# Patient Record
Sex: Male | Born: 1935 | Race: White | Hispanic: No | Marital: Married | Smoking: Former smoker
Health system: Southern US, Community
[De-identification: ages and names within clinical notes are randomized; demographics above are authoritative.]

## PROBLEM LIST (undated history)

## (undated) DIAGNOSIS — T4145XA Adverse effect of unspecified anesthetic, initial encounter: Secondary | ICD-10-CM

## (undated) DIAGNOSIS — R0600 Dyspnea, unspecified: Secondary | ICD-10-CM

## (undated) DIAGNOSIS — I272 Pulmonary hypertension, unspecified: Secondary | ICD-10-CM

## (undated) DIAGNOSIS — I10 Essential (primary) hypertension: Secondary | ICD-10-CM

## (undated) DIAGNOSIS — I509 Heart failure, unspecified: Secondary | ICD-10-CM

## (undated) DIAGNOSIS — M1990 Unspecified osteoarthritis, unspecified site: Secondary | ICD-10-CM

## (undated) DIAGNOSIS — N059 Unspecified nephritic syndrome with unspecified morphologic changes: Secondary | ICD-10-CM

## (undated) DIAGNOSIS — R112 Nausea with vomiting, unspecified: Secondary | ICD-10-CM

## (undated) DIAGNOSIS — I8290 Acute embolism and thrombosis of unspecified vein: Secondary | ICD-10-CM

## (undated) DIAGNOSIS — D649 Anemia, unspecified: Secondary | ICD-10-CM

## (undated) DIAGNOSIS — N2889 Other specified disorders of kidney and ureter: Secondary | ICD-10-CM

## (undated) DIAGNOSIS — E785 Hyperlipidemia, unspecified: Secondary | ICD-10-CM

## (undated) DIAGNOSIS — Z8781 Personal history of (healed) traumatic fracture: Secondary | ICD-10-CM

## (undated) DIAGNOSIS — Z9889 Other specified postprocedural states: Secondary | ICD-10-CM

## (undated) DIAGNOSIS — N400 Enlarged prostate without lower urinary tract symptoms: Secondary | ICD-10-CM

## (undated) DIAGNOSIS — T8859XA Other complications of anesthesia, initial encounter: Secondary | ICD-10-CM

## (undated) DIAGNOSIS — K219 Gastro-esophageal reflux disease without esophagitis: Secondary | ICD-10-CM

## (undated) DIAGNOSIS — J449 Chronic obstructive pulmonary disease, unspecified: Secondary | ICD-10-CM

## (undated) DIAGNOSIS — J189 Pneumonia, unspecified organism: Secondary | ICD-10-CM

## (undated) DIAGNOSIS — G4730 Sleep apnea, unspecified: Secondary | ICD-10-CM

## (undated) DIAGNOSIS — Z972 Presence of dental prosthetic device (complete) (partial): Secondary | ICD-10-CM

## (undated) DIAGNOSIS — Z9289 Personal history of other medical treatment: Secondary | ICD-10-CM

## (undated) DIAGNOSIS — C679 Malignant neoplasm of bladder, unspecified: Secondary | ICD-10-CM

## (undated) DIAGNOSIS — K08109 Complete loss of teeth, unspecified cause, unspecified class: Secondary | ICD-10-CM

## (undated) HISTORY — PX: TONSILLECTOMY AND ADENOIDECTOMY: SUR1326

---

## 1968-11-28 HISTORY — PX: KNEE LIGAMENT RECONSTRUCTION: SHX1895

## 1983-11-29 HISTORY — PX: APPENDECTOMY: SHX54

## 2004-11-28 HISTORY — PX: TRANSURETHRAL RESECTION OF PROSTATE: SHX73

## 2005-11-28 DIAGNOSIS — I8290 Acute embolism and thrombosis of unspecified vein: Secondary | ICD-10-CM

## 2005-11-28 DIAGNOSIS — N059 Unspecified nephritic syndrome with unspecified morphologic changes: Secondary | ICD-10-CM

## 2005-11-28 HISTORY — DX: Acute embolism and thrombosis of unspecified vein: I82.90

## 2005-11-28 HISTORY — DX: Unspecified nephritic syndrome with unspecified morphologic changes: N05.9

## 2006-04-17 ENCOUNTER — Ambulatory Visit (HOSPITAL_COMMUNITY): Admission: RE | Admit: 2006-04-17 | Discharge: 2006-04-18 | Payer: Self-pay | Admitting: Nephrology

## 2006-04-17 ENCOUNTER — Encounter (INDEPENDENT_AMBULATORY_CARE_PROVIDER_SITE_OTHER): Payer: Self-pay | Admitting: Specialist

## 2006-04-27 ENCOUNTER — Emergency Department (HOSPITAL_COMMUNITY): Admission: EM | Admit: 2006-04-27 | Discharge: 2006-04-27 | Payer: Self-pay | Admitting: Emergency Medicine

## 2008-02-01 ENCOUNTER — Encounter: Admission: RE | Admit: 2008-02-01 | Discharge: 2008-02-01 | Payer: Self-pay | Admitting: Orthopedic Surgery

## 2008-02-05 ENCOUNTER — Ambulatory Visit (HOSPITAL_BASED_OUTPATIENT_CLINIC_OR_DEPARTMENT_OTHER): Admission: RE | Admit: 2008-02-05 | Discharge: 2008-02-05 | Payer: Self-pay | Admitting: Orthopedic Surgery

## 2008-02-05 HISTORY — PX: CARPAL TUNNEL RELEASE: SHX101

## 2009-11-28 HISTORY — PX: OTHER SURGICAL HISTORY: SHX169

## 2011-04-12 NOTE — Op Note (Signed)
Victor Wang, Victor Wang            ACCOUNT NO.:  [id number]   MEDICAL RECORD NO.:  [id number]          PATIENT TYPE:  AMB   LOCATION:  DSC                          FACILITY:  [facility]   PHYSICIAN:  Cindee Salt, M.D.       DATE OF BIRTH:  Dec 01, 1934   DATE OF PROCEDURE:  02/05/2008  DATE OF DISCHARGE:                               OPERATIVE REPORT   PREOPERATIVE DIAGNOSIS:  Carpal tunnel syndrome, right hand.   POSTOPERATIVE DIAGNOSIS:  Carpal tunnel syndrome, right hand.   OPERATION:  Decompression right median nerve.   SURGEON:  Cindee Salt, M.D.   ASSISTANT:  Carolyne Fiscal R.N.   ANESTHESIA:  Forearm based IV regional.   DATE OF OPERATION:  February 05, 2008   ANESTHESIOLOGIST:  Crus.   HISTORY:  The patient is a 76 year old male with a history of carpal  tunnel syndrome, EMG nerve conductions positive.  This has not responded  to conservative treatment.  He is desirous of having this surgically  released.  He is aware of risks and complications including infection,  recurrence, injury to arteries, nerves, tendons, incomplete relief of  symptoms, dystrophy.  In the preoperative area the patient is seen.  The  extremity marked by both the patient and surgeon.  Antibiotic given.   PROCEDURE:  The patient is brought to the operating room where a forearm  based IV regional anesthetic was carried out without difficulty.  Was  prepped using DuraPrep, supine position, right arm free.  After a time-  out, a longitudinal incision was made, carried down through subcutaneous  tissue.  Bleeders were electrocauterized.  Palmar fascia split,  superficial palmar arch identified and the flexor tendon to the ring and  little finger identified to the ulnar side of the median nerve.  The  carpal retinaculum was incised with sharp dissection, right angle and  Sewall retractor were placed through skin and forearm fascia.  Fascia  was released for approximately 1.5 cm proximal to the wrist crease under  direct vision.  The canal was explored.  A definite area of compression  with hourglass deformity, hyperemia to the nerve was immediately  apparent.  No further lesions were identified.  The wound was  irrigated.  The skin closed with interrupted 5-0 Vicryl Rapide sutures.  Sterile compressive dressing and wrist splint applied.  The patient  tolerated the procedure well was taken to the recovery for observation  in satisfactory condition.  He will be discharged home to return to the  [facility] in 1 week on Vicodin.           ______________________________  Cindee Salt, M.D.     GK/MEDQ  D:  02/05/2008  T:  02/06/2008  Job:  [id number]   cc:   Hilaria Ota, M.D.

## 2011-08-22 LAB — POCT HEMOGLOBIN-HEMACUE: Hemoglobin: 14.7

## 2011-08-22 LAB — BASIC METABOLIC PANEL
Chloride: 103
GFR calc non Af Amer: >60
Sodium: 137

## 2011-11-30 DIAGNOSIS — M25519 Pain in unspecified shoulder: Secondary | ICD-10-CM | POA: Diagnosis not present

## 2011-12-07 DIAGNOSIS — M25519 Pain in unspecified shoulder: Secondary | ICD-10-CM | POA: Diagnosis not present

## 2011-12-14 DIAGNOSIS — D649 Anemia, unspecified: Secondary | ICD-10-CM | POA: Diagnosis not present

## 2011-12-14 DIAGNOSIS — I10 Essential (primary) hypertension: Secondary | ICD-10-CM | POA: Diagnosis not present

## 2011-12-14 DIAGNOSIS — D759 Disease of blood and blood-forming organs, unspecified: Secondary | ICD-10-CM | POA: Diagnosis not present

## 2011-12-19 DIAGNOSIS — M25519 Pain in unspecified shoulder: Secondary | ICD-10-CM | POA: Diagnosis not present

## 2011-12-22 DIAGNOSIS — M25519 Pain in unspecified shoulder: Secondary | ICD-10-CM | POA: Diagnosis not present

## 2011-12-29 DIAGNOSIS — M25519 Pain in unspecified shoulder: Secondary | ICD-10-CM | POA: Diagnosis not present

## 2012-01-05 DIAGNOSIS — M25519 Pain in unspecified shoulder: Secondary | ICD-10-CM | POA: Diagnosis not present

## 2012-01-16 DIAGNOSIS — K219 Gastro-esophageal reflux disease without esophagitis: Secondary | ICD-10-CM | POA: Diagnosis not present

## 2012-01-16 DIAGNOSIS — M25519 Pain in unspecified shoulder: Secondary | ICD-10-CM | POA: Diagnosis not present

## 2012-01-19 DIAGNOSIS — M25519 Pain in unspecified shoulder: Secondary | ICD-10-CM | POA: Diagnosis not present

## 2012-01-30 DIAGNOSIS — M25519 Pain in unspecified shoulder: Secondary | ICD-10-CM | POA: Diagnosis not present

## 2012-03-19 DIAGNOSIS — I10 Essential (primary) hypertension: Secondary | ICD-10-CM | POA: Diagnosis not present

## 2012-03-19 DIAGNOSIS — Z1212 Encounter for screening for malignant neoplasm of rectum: Secondary | ICD-10-CM | POA: Diagnosis not present

## 2012-03-19 DIAGNOSIS — R3911 Hesitancy of micturition: Secondary | ICD-10-CM | POA: Diagnosis not present

## 2012-03-19 DIAGNOSIS — N049 Nephrotic syndrome with unspecified morphologic changes: Secondary | ICD-10-CM | POA: Diagnosis not present

## 2012-03-19 DIAGNOSIS — J984 Other disorders of lung: Secondary | ICD-10-CM | POA: Diagnosis not present

## 2012-03-19 DIAGNOSIS — E785 Hyperlipidemia, unspecified: Secondary | ICD-10-CM | POA: Diagnosis not present

## 2012-03-19 DIAGNOSIS — D649 Anemia, unspecified: Secondary | ICD-10-CM | POA: Diagnosis not present

## 2012-03-21 DIAGNOSIS — J984 Other disorders of lung: Secondary | ICD-10-CM | POA: Diagnosis not present

## 2012-05-02 DIAGNOSIS — H2510 Age-related nuclear cataract, unspecified eye: Secondary | ICD-10-CM | POA: Diagnosis not present

## 2012-06-18 DIAGNOSIS — I10 Essential (primary) hypertension: Secondary | ICD-10-CM | POA: Diagnosis not present

## 2012-06-18 DIAGNOSIS — D509 Iron deficiency anemia, unspecified: Secondary | ICD-10-CM | POA: Diagnosis not present

## 2012-07-09 DIAGNOSIS — N058 Unspecified nephritic syndrome with other morphologic changes: Secondary | ICD-10-CM | POA: Diagnosis not present

## 2012-07-09 DIAGNOSIS — N052 Unspecified nephritic syndrome with diffuse membranous glomerulonephritis: Secondary | ICD-10-CM | POA: Diagnosis not present

## 2012-09-24 DIAGNOSIS — I10 Essential (primary) hypertension: Secondary | ICD-10-CM | POA: Diagnosis not present

## 2012-09-24 DIAGNOSIS — Z23 Encounter for immunization: Secondary | ICD-10-CM | POA: Diagnosis not present

## 2012-11-15 DIAGNOSIS — J018 Other acute sinusitis: Secondary | ICD-10-CM | POA: Diagnosis not present

## 2012-12-17 DIAGNOSIS — M999 Biomechanical lesion, unspecified: Secondary | ICD-10-CM | POA: Diagnosis not present

## 2012-12-19 DIAGNOSIS — M999 Biomechanical lesion, unspecified: Secondary | ICD-10-CM | POA: Diagnosis not present

## 2012-12-20 DIAGNOSIS — M999 Biomechanical lesion, unspecified: Secondary | ICD-10-CM | POA: Diagnosis not present

## 2012-12-24 DIAGNOSIS — M999 Biomechanical lesion, unspecified: Secondary | ICD-10-CM | POA: Diagnosis not present

## 2012-12-27 DIAGNOSIS — M999 Biomechanical lesion, unspecified: Secondary | ICD-10-CM | POA: Diagnosis not present

## 2012-12-28 DIAGNOSIS — I10 Essential (primary) hypertension: Secondary | ICD-10-CM | POA: Diagnosis not present

## 2012-12-28 DIAGNOSIS — S2239XA Fracture of one rib, unspecified side, initial encounter for closed fracture: Secondary | ICD-10-CM | POA: Diagnosis not present

## 2013-01-03 DIAGNOSIS — M999 Biomechanical lesion, unspecified: Secondary | ICD-10-CM | POA: Diagnosis not present

## 2013-01-07 DIAGNOSIS — M818 Other osteoporosis without current pathological fracture: Secondary | ICD-10-CM | POA: Diagnosis not present

## 2013-01-07 DIAGNOSIS — Z13820 Encounter for screening for osteoporosis: Secondary | ICD-10-CM | POA: Diagnosis not present

## 2013-01-15 DIAGNOSIS — M810 Age-related osteoporosis without current pathological fracture: Secondary | ICD-10-CM | POA: Diagnosis not present

## 2013-01-15 DIAGNOSIS — E559 Vitamin D deficiency, unspecified: Secondary | ICD-10-CM | POA: Diagnosis not present

## 2013-03-29 DIAGNOSIS — Z1212 Encounter for screening for malignant neoplasm of rectum: Secondary | ICD-10-CM | POA: Diagnosis not present

## 2013-03-29 DIAGNOSIS — E785 Hyperlipidemia, unspecified: Secondary | ICD-10-CM | POA: Diagnosis not present

## 2013-03-29 DIAGNOSIS — I10 Essential (primary) hypertension: Secondary | ICD-10-CM | POA: Diagnosis not present

## 2013-03-29 DIAGNOSIS — J984 Other disorders of lung: Secondary | ICD-10-CM | POA: Diagnosis not present

## 2013-03-29 DIAGNOSIS — D649 Anemia, unspecified: Secondary | ICD-10-CM | POA: Diagnosis not present

## 2013-03-29 DIAGNOSIS — E559 Vitamin D deficiency, unspecified: Secondary | ICD-10-CM | POA: Diagnosis not present

## 2013-03-29 DIAGNOSIS — N400 Enlarged prostate without lower urinary tract symptoms: Secondary | ICD-10-CM | POA: Diagnosis not present

## 2013-03-29 DIAGNOSIS — Z125 Encounter for screening for malignant neoplasm of prostate: Secondary | ICD-10-CM | POA: Diagnosis not present

## 2013-03-29 DIAGNOSIS — M818 Other osteoporosis without current pathological fracture: Secondary | ICD-10-CM | POA: Diagnosis not present

## 2013-04-09 DIAGNOSIS — Z87311 Personal history of (healed) other pathological fracture: Secondary | ICD-10-CM | POA: Diagnosis not present

## 2013-04-09 DIAGNOSIS — M954 Acquired deformity of chest and rib: Secondary | ICD-10-CM | POA: Diagnosis not present

## 2013-04-09 DIAGNOSIS — R911 Solitary pulmonary nodule: Secondary | ICD-10-CM | POA: Diagnosis not present

## 2013-04-09 DIAGNOSIS — M899 Disorder of bone, unspecified: Secondary | ICD-10-CM | POA: Diagnosis not present

## 2013-05-02 DIAGNOSIS — M549 Dorsalgia, unspecified: Secondary | ICD-10-CM | POA: Diagnosis not present

## 2013-05-03 DIAGNOSIS — S22009A Unspecified fracture of unspecified thoracic vertebra, initial encounter for closed fracture: Secondary | ICD-10-CM | POA: Diagnosis not present

## 2013-05-03 DIAGNOSIS — IMO0002 Reserved for concepts with insufficient information to code with codable children: Secondary | ICD-10-CM | POA: Diagnosis not present

## 2013-05-03 DIAGNOSIS — M5126 Other intervertebral disc displacement, lumbar region: Secondary | ICD-10-CM | POA: Diagnosis not present

## 2013-05-08 DIAGNOSIS — S32009A Unspecified fracture of unspecified lumbar vertebra, initial encounter for closed fracture: Secondary | ICD-10-CM | POA: Diagnosis not present

## 2013-05-10 DIAGNOSIS — S22009A Unspecified fracture of unspecified thoracic vertebra, initial encounter for closed fracture: Secondary | ICD-10-CM | POA: Diagnosis not present

## 2013-05-10 DIAGNOSIS — S32009A Unspecified fracture of unspecified lumbar vertebra, initial encounter for closed fracture: Secondary | ICD-10-CM | POA: Diagnosis not present

## 2013-05-15 DIAGNOSIS — S22009A Unspecified fracture of unspecified thoracic vertebra, initial encounter for closed fracture: Secondary | ICD-10-CM | POA: Diagnosis not present

## 2013-07-02 DIAGNOSIS — K7689 Other specified diseases of liver: Secondary | ICD-10-CM | POA: Diagnosis not present

## 2013-07-02 DIAGNOSIS — I10 Essential (primary) hypertension: Secondary | ICD-10-CM | POA: Diagnosis not present

## 2013-07-02 DIAGNOSIS — E785 Hyperlipidemia, unspecified: Secondary | ICD-10-CM | POA: Diagnosis not present

## 2013-07-26 DIAGNOSIS — N052 Unspecified nephritic syndrome with diffuse membranous glomerulonephritis: Secondary | ICD-10-CM | POA: Diagnosis not present

## 2013-08-14 DIAGNOSIS — S32009A Unspecified fracture of unspecified lumbar vertebra, initial encounter for closed fracture: Secondary | ICD-10-CM | POA: Diagnosis not present

## 2013-08-19 DIAGNOSIS — H2510 Age-related nuclear cataract, unspecified eye: Secondary | ICD-10-CM | POA: Diagnosis not present

## 2013-08-21 DIAGNOSIS — R269 Unspecified abnormalities of gait and mobility: Secondary | ICD-10-CM | POA: Diagnosis not present

## 2013-08-21 DIAGNOSIS — M546 Pain in thoracic spine: Secondary | ICD-10-CM | POA: Diagnosis not present

## 2013-08-21 DIAGNOSIS — S22009A Unspecified fracture of unspecified thoracic vertebra, initial encounter for closed fracture: Secondary | ICD-10-CM | POA: Diagnosis not present

## 2013-08-27 DIAGNOSIS — R269 Unspecified abnormalities of gait and mobility: Secondary | ICD-10-CM | POA: Diagnosis not present

## 2013-08-27 DIAGNOSIS — S22009A Unspecified fracture of unspecified thoracic vertebra, initial encounter for closed fracture: Secondary | ICD-10-CM | POA: Diagnosis not present

## 2013-08-27 DIAGNOSIS — M546 Pain in thoracic spine: Secondary | ICD-10-CM | POA: Diagnosis not present

## 2013-08-29 DIAGNOSIS — S22009A Unspecified fracture of unspecified thoracic vertebra, initial encounter for closed fracture: Secondary | ICD-10-CM | POA: Diagnosis not present

## 2013-08-29 DIAGNOSIS — R269 Unspecified abnormalities of gait and mobility: Secondary | ICD-10-CM | POA: Diagnosis not present

## 2013-08-29 DIAGNOSIS — M546 Pain in thoracic spine: Secondary | ICD-10-CM | POA: Diagnosis not present

## 2013-09-03 DIAGNOSIS — M546 Pain in thoracic spine: Secondary | ICD-10-CM | POA: Diagnosis not present

## 2013-09-03 DIAGNOSIS — R269 Unspecified abnormalities of gait and mobility: Secondary | ICD-10-CM | POA: Diagnosis not present

## 2013-09-03 DIAGNOSIS — S22009A Unspecified fracture of unspecified thoracic vertebra, initial encounter for closed fracture: Secondary | ICD-10-CM | POA: Diagnosis not present

## 2013-09-05 DIAGNOSIS — M546 Pain in thoracic spine: Secondary | ICD-10-CM | POA: Diagnosis not present

## 2013-09-05 DIAGNOSIS — R269 Unspecified abnormalities of gait and mobility: Secondary | ICD-10-CM | POA: Diagnosis not present

## 2013-09-05 DIAGNOSIS — S22009A Unspecified fracture of unspecified thoracic vertebra, initial encounter for closed fracture: Secondary | ICD-10-CM | POA: Diagnosis not present

## 2013-09-10 DIAGNOSIS — M546 Pain in thoracic spine: Secondary | ICD-10-CM | POA: Diagnosis not present

## 2013-09-10 DIAGNOSIS — S22009A Unspecified fracture of unspecified thoracic vertebra, initial encounter for closed fracture: Secondary | ICD-10-CM | POA: Diagnosis not present

## 2013-09-10 DIAGNOSIS — R269 Unspecified abnormalities of gait and mobility: Secondary | ICD-10-CM | POA: Diagnosis not present

## 2013-09-12 DIAGNOSIS — M546 Pain in thoracic spine: Secondary | ICD-10-CM | POA: Diagnosis not present

## 2013-09-12 DIAGNOSIS — S22009A Unspecified fracture of unspecified thoracic vertebra, initial encounter for closed fracture: Secondary | ICD-10-CM | POA: Diagnosis not present

## 2013-09-12 DIAGNOSIS — R269 Unspecified abnormalities of gait and mobility: Secondary | ICD-10-CM | POA: Diagnosis not present

## 2013-09-17 DIAGNOSIS — S22009A Unspecified fracture of unspecified thoracic vertebra, initial encounter for closed fracture: Secondary | ICD-10-CM | POA: Diagnosis not present

## 2013-09-17 DIAGNOSIS — M546 Pain in thoracic spine: Secondary | ICD-10-CM | POA: Diagnosis not present

## 2013-09-17 DIAGNOSIS — R269 Unspecified abnormalities of gait and mobility: Secondary | ICD-10-CM | POA: Diagnosis not present

## 2013-09-19 DIAGNOSIS — S22009A Unspecified fracture of unspecified thoracic vertebra, initial encounter for closed fracture: Secondary | ICD-10-CM | POA: Diagnosis not present

## 2013-09-19 DIAGNOSIS — R269 Unspecified abnormalities of gait and mobility: Secondary | ICD-10-CM | POA: Diagnosis not present

## 2013-09-19 DIAGNOSIS — M546 Pain in thoracic spine: Secondary | ICD-10-CM | POA: Diagnosis not present

## 2013-10-04 DIAGNOSIS — M8448XA Pathological fracture, other site, initial encounter for fracture: Secondary | ICD-10-CM | POA: Diagnosis not present

## 2013-10-04 DIAGNOSIS — Z23 Encounter for immunization: Secondary | ICD-10-CM | POA: Diagnosis not present

## 2013-10-04 DIAGNOSIS — E785 Hyperlipidemia, unspecified: Secondary | ICD-10-CM | POA: Diagnosis not present

## 2013-10-04 DIAGNOSIS — I10 Essential (primary) hypertension: Secondary | ICD-10-CM | POA: Diagnosis not present

## 2013-10-09 DIAGNOSIS — S22009A Unspecified fracture of unspecified thoracic vertebra, initial encounter for closed fracture: Secondary | ICD-10-CM | POA: Diagnosis not present

## 2013-12-26 DIAGNOSIS — K219 Gastro-esophageal reflux disease without esophagitis: Secondary | ICD-10-CM | POA: Diagnosis not present

## 2013-12-26 DIAGNOSIS — K5730 Diverticulosis of large intestine without perforation or abscess without bleeding: Secondary | ICD-10-CM | POA: Diagnosis not present

## 2013-12-26 DIAGNOSIS — D500 Iron deficiency anemia secondary to blood loss (chronic): Secondary | ICD-10-CM | POA: Diagnosis not present

## 2014-01-06 DIAGNOSIS — E785 Hyperlipidemia, unspecified: Secondary | ICD-10-CM | POA: Diagnosis not present

## 2014-01-06 DIAGNOSIS — I10 Essential (primary) hypertension: Secondary | ICD-10-CM | POA: Diagnosis not present

## 2014-01-06 DIAGNOSIS — N042 Nephrotic syndrome with diffuse membranous glomerulonephritis: Secondary | ICD-10-CM | POA: Diagnosis not present

## 2014-01-27 DIAGNOSIS — R319 Hematuria, unspecified: Secondary | ICD-10-CM | POA: Diagnosis not present

## 2014-02-17 DIAGNOSIS — R3129 Other microscopic hematuria: Secondary | ICD-10-CM | POA: Diagnosis not present

## 2014-02-17 DIAGNOSIS — N052 Unspecified nephritic syndrome with diffuse membranous glomerulonephritis: Secondary | ICD-10-CM | POA: Diagnosis not present

## 2014-02-17 DIAGNOSIS — I129 Hypertensive chronic kidney disease with stage 1 through stage 4 chronic kidney disease, or unspecified chronic kidney disease: Secondary | ICD-10-CM | POA: Diagnosis not present

## 2014-03-17 DIAGNOSIS — R310 Gross hematuria: Secondary | ICD-10-CM | POA: Diagnosis not present

## 2014-03-24 DIAGNOSIS — K8020 Calculus of gallbladder without cholecystitis without obstruction: Secondary | ICD-10-CM | POA: Diagnosis not present

## 2014-03-24 DIAGNOSIS — I723 Aneurysm of iliac artery: Secondary | ICD-10-CM | POA: Diagnosis not present

## 2014-03-24 DIAGNOSIS — K7460 Unspecified cirrhosis of liver: Secondary | ICD-10-CM | POA: Diagnosis not present

## 2014-03-24 DIAGNOSIS — R310 Gross hematuria: Secondary | ICD-10-CM | POA: Diagnosis not present

## 2014-03-31 DIAGNOSIS — R310 Gross hematuria: Secondary | ICD-10-CM | POA: Diagnosis not present

## 2014-04-01 ENCOUNTER — Other Ambulatory Visit: Payer: Self-pay | Admitting: Urology

## 2014-04-04 ENCOUNTER — Encounter (HOSPITAL_BASED_OUTPATIENT_CLINIC_OR_DEPARTMENT_OTHER): Payer: Self-pay | Admitting: *Deleted

## 2014-04-04 NOTE — Progress Notes (Addendum)
04/04/14 1443  OBSTRUCTIVE SLEEP APNEA  Have you ever been diagnosed with sleep apnea through a sleep study? No  Do you snore loudly (loud enough to be heard through closed doors)?  1  Do you often feel tired, fatigued, or sleepy during the daytime? 0  Has anyone observed you stop breathing during your sleep? 0  Do you have, or are you being treated for high blood pressure? 1  BMI more than 35 kg/m2? 1  Age over 77 years old? 1  Neck circumference greater than 40 cm/16 inches? 1  Gender: 1  Obstructive Sleep Apnea Score 6  Score 4 or greater  Results sent to PCP    FAXED TO PT PCP,  DR Barbie Haggis  WITH [facility].

## 2014-04-04 NOTE — Progress Notes (Signed)
NPO AFTER MN WITH EXCEPTION CLEAR LIQUIDS UNTIL 0830 (NO CREAM/ MILK PRODUCTS).  ARRIVE AT 1315.  NEEDS ISTAT AND EKG.   WILL TAKE COZAAR AND METOPROLOL AM DOS W/ SIPS OF WATER.

## 2014-04-09 ENCOUNTER — Encounter (HOSPITAL_BASED_OUTPATIENT_CLINIC_OR_DEPARTMENT_OTHER)
Admission: RE | Disposition: A | Discharge status: Home / Self Care | Payer: Self-pay | Source: Ambulatory Visit | Attending: Urology

## 2014-04-09 ENCOUNTER — Encounter (HOSPITAL_BASED_OUTPATIENT_CLINIC_OR_DEPARTMENT_OTHER): Payer: Self-pay

## 2014-04-09 ENCOUNTER — Ambulatory Visit (HOSPITAL_BASED_OUTPATIENT_CLINIC_OR_DEPARTMENT_OTHER)
Admission: RE | Admit: 2014-04-09 | Discharge: 2014-04-09 | Disposition: A | Discharge status: Home / Self Care | Payer: Medicare Other | Source: Ambulatory Visit | Attending: Urology | Admitting: Urology

## 2014-04-09 ENCOUNTER — Ambulatory Visit (HOSPITAL_BASED_OUTPATIENT_CLINIC_OR_DEPARTMENT_OTHER): Payer: Medicare Other | Admitting: Anesthesiology

## 2014-04-09 ENCOUNTER — Encounter (HOSPITAL_BASED_OUTPATIENT_CLINIC_OR_DEPARTMENT_OTHER): Payer: Medicare Other | Admitting: Anesthesiology

## 2014-04-09 DIAGNOSIS — C672 Malignant neoplasm of lateral wall of bladder: Secondary | ICD-10-CM | POA: Diagnosis not present

## 2014-04-09 DIAGNOSIS — I10 Essential (primary) hypertension: Secondary | ICD-10-CM | POA: Insufficient documentation

## 2014-04-09 DIAGNOSIS — M129 Arthropathy, unspecified: Secondary | ICD-10-CM | POA: Diagnosis not present

## 2014-04-09 DIAGNOSIS — Z880 Allergy status to penicillin: Secondary | ICD-10-CM | POA: Diagnosis not present

## 2014-04-09 DIAGNOSIS — E785 Hyperlipidemia, unspecified: Secondary | ICD-10-CM | POA: Insufficient documentation

## 2014-04-09 DIAGNOSIS — N400 Enlarged prostate without lower urinary tract symptoms: Secondary | ICD-10-CM | POA: Diagnosis not present

## 2014-04-09 DIAGNOSIS — Z87891 Personal history of nicotine dependence: Secondary | ICD-10-CM | POA: Diagnosis not present

## 2014-04-09 DIAGNOSIS — C679 Malignant neoplasm of bladder, unspecified: Secondary | ICD-10-CM | POA: Insufficient documentation

## 2014-04-09 DIAGNOSIS — K219 Gastro-esophageal reflux disease without esophagitis: Secondary | ICD-10-CM | POA: Insufficient documentation

## 2014-04-09 DIAGNOSIS — D494 Neoplasm of unspecified behavior of bladder: Secondary | ICD-10-CM

## 2014-04-09 DIAGNOSIS — R310 Gross hematuria: Secondary | ICD-10-CM | POA: Diagnosis not present

## 2014-04-09 HISTORY — DX: Complete loss of teeth, unspecified cause, unspecified class: Z97.2

## 2014-04-09 HISTORY — DX: Gastro-esophageal reflux disease without esophagitis: K21.9

## 2014-04-09 HISTORY — PX: TRANSURETHRAL RESECTION OF BLADDER TUMOR WITH GYRUS (TURBT-GYRUS): SHX6458

## 2014-04-09 HISTORY — DX: Essential (primary) hypertension: I10

## 2014-04-09 HISTORY — DX: Benign prostatic hyperplasia without lower urinary tract symptoms: N40.0

## 2014-04-09 HISTORY — DX: Hyperlipidemia, unspecified: E78.5

## 2014-04-09 HISTORY — DX: Complete loss of teeth, unspecified cause, unspecified class: K08.109

## 2014-04-09 HISTORY — PX: CYSTOSCOPY WITH RETROGRADE PYELOGRAM, URETEROSCOPY AND STENT PLACEMENT: SHX5789

## 2014-04-09 HISTORY — DX: Unspecified osteoarthritis, unspecified site: M19.90

## 2014-04-09 HISTORY — DX: Personal history of (healed) traumatic fracture: Z87.81

## 2014-04-09 LAB — POCT I-STAT 4, (NA,K, GLUC, HGB,HCT)
Glucose, Bld: 114 mg/dL — ABNORMAL HIGH (ref 70–99)
HEMATOCRIT: 43 % (ref 39.0–52.0)
Hemoglobin: 14.6 g/dL (ref 13.0–17.0)
Potassium: 4.3 mEq/L (ref 3.7–5.3)
SODIUM: 138 meq/L (ref 137–147)

## 2014-04-09 SURGERY — TRANSURETHRAL RESECTION OF BLADDER TUMOR WITH GYRUS (TURBT-GYRUS)
Anesthesia: General | Site: Ureter | Laterality: Bilateral

## 2014-04-09 MED ORDER — OXYCODONE-ACETAMINOPHEN 5-325 MG PO TABS: 1.0000 | ORAL_TABLET | ORAL | Status: DC | PRN

## 2014-04-09 MED ORDER — DOXYCYCLINE HYCLATE 50 MG PO CAPS: 100.0000 mg | ORAL_CAPSULE | Freq: Two times a day (BID) | ORAL | Status: DC

## 2014-04-09 MED ORDER — FENTANYL CITRATE 0.05 MG/ML IJ SOLN
INTRAMUSCULAR | Status: AC
  Filled 2014-04-09: qty 4

## 2014-04-09 MED ORDER — OXYBUTYNIN CHLORIDE 5 MG PO TABS
5.0000 mg | ORAL_TABLET | Freq: Four times a day (QID) | ORAL | Status: DC | PRN
  Administered 2014-04-09: 5 mg via ORAL
  Filled 2014-04-09: qty 1

## 2014-04-09 MED ORDER — CIPROFLOXACIN IN D5W 400 MG/200ML IV SOLN
400.0000 mg | INTRAVENOUS | Status: AC
  Administered 2014-04-09: 400 mg via INTRAVENOUS
  Filled 2014-04-09: qty 200

## 2014-04-09 MED ORDER — EPHEDRINE SULFATE 50 MG/ML IJ SOLN
INTRAMUSCULAR | Status: DC | PRN
  Administered 2014-04-09 (×2): 10 mg via INTRAVENOUS
  Administered 2014-04-09: 15 mg via INTRAVENOUS
  Administered 2014-04-09: 5 mg via INTRAVENOUS
  Administered 2014-04-09: 10 mg via INTRAVENOUS

## 2014-04-09 MED ORDER — BELLADONNA ALKALOIDS-OPIUM 16.2-60 MG RE SUPP
RECTAL | Status: DC | PRN
  Administered 2014-04-09: 1 via RECTAL

## 2014-04-09 MED ORDER — PROMETHAZINE HCL 25 MG/ML IJ SOLN
6.2500 mg | INTRAMUSCULAR | Status: DC | PRN
  Filled 2014-04-09: qty 1

## 2014-04-09 MED ORDER — LACTATED RINGERS IV SOLN
INTRAVENOUS | Status: DC
  Administered 2014-04-09 (×2): via INTRAVENOUS
  Filled 2014-04-09: qty 1000

## 2014-04-09 MED ORDER — SENNOSIDES-DOCUSATE SODIUM 8.6-50 MG PO TABS: 1.0000 | ORAL_TABLET | Freq: Two times a day (BID) | ORAL | Status: DC

## 2014-04-09 MED ORDER — DEXAMETHASONE SODIUM PHOSPHATE 4 MG/ML IJ SOLN
INTRAMUSCULAR | Status: DC | PRN
  Administered 2014-04-09: 10 mg via INTRAVENOUS

## 2014-04-09 MED ORDER — PROPOFOL 10 MG/ML IV BOLUS
INTRAVENOUS | Status: DC | PRN
  Administered 2014-04-09: 30 mg via INTRAVENOUS
  Administered 2014-04-09: 200 mg via INTRAVENOUS

## 2014-04-09 MED ORDER — PHENYLEPHRINE HCL 10 MG/ML IJ SOLN
INTRAMUSCULAR | Status: DC | PRN
  Administered 2014-04-09: 100 ug via INTRAVENOUS

## 2014-04-09 MED ORDER — SODIUM CHLORIDE 0.9 % IR SOLN
Status: DC | PRN
  Administered 2014-04-09: 5000 mL

## 2014-04-09 MED ORDER — GLYCOPYRROLATE 0.2 MG/ML IJ SOLN
INTRAMUSCULAR | Status: DC | PRN
  Administered 2014-04-09: 0.6 mg via INTRAVENOUS

## 2014-04-09 MED ORDER — OXYCODONE HCL 5 MG PO TABS
5.0000 mg | ORAL_TABLET | Freq: Once | ORAL | Status: AC
  Administered 2014-04-09: 5 mg via ORAL
  Filled 2014-04-09: qty 1

## 2014-04-09 MED ORDER — OXYCODONE HCL 5 MG PO TABS
ORAL_TABLET | ORAL | Status: AC
  Filled 2014-04-09: qty 1

## 2014-04-09 MED ORDER — ROCURONIUM BROMIDE 100 MG/10ML IV SOLN
INTRAVENOUS | Status: DC | PRN
  Administered 2014-04-09: 10 mg via INTRAVENOUS
  Administered 2014-04-09: 20 mg via INTRAVENOUS

## 2014-04-09 MED ORDER — BELLADONNA ALKALOIDS-OPIUM 16.2-60 MG RE SUPP
RECTAL | Status: AC
  Filled 2014-04-09: qty 1

## 2014-04-09 MED ORDER — FENTANYL CITRATE 0.05 MG/ML IJ SOLN
INTRAMUSCULAR | Status: AC
  Filled 2014-04-09: qty 2

## 2014-04-09 MED ORDER — OXYBUTYNIN CHLORIDE 5 MG PO TABS
ORAL_TABLET | ORAL | Status: AC
  Filled 2014-04-09: qty 1

## 2014-04-09 MED ORDER — FENTANYL CITRATE 0.05 MG/ML IJ SOLN
25.0000 ug | INTRAMUSCULAR | Status: DC | PRN
  Administered 2014-04-09: 25 ug via INTRAVENOUS
  Filled 2014-04-09: qty 1

## 2014-04-09 MED ORDER — LIDOCAINE HCL (CARDIAC) 20 MG/ML IV SOLN
INTRAVENOUS | Status: DC | PRN
  Administered 2014-04-09: 60 mg via INTRAVENOUS

## 2014-04-09 MED ORDER — NEOSTIGMINE METHYLSULFATE 10 MG/10ML IV SOLN
INTRAVENOUS | Status: DC | PRN
  Administered 2014-04-09: 4 mg via INTRAVENOUS

## 2014-04-09 MED ORDER — HYOSCYAMINE SULFATE 0.125 MG PO TABS: 0.1250 mg | ORAL_TABLET | ORAL | Status: DC | PRN

## 2014-04-09 MED ORDER — ONDANSETRON HCL 4 MG/2ML IJ SOLN
INTRAMUSCULAR | Status: DC | PRN
  Administered 2014-04-09: 4 mg via INTRAVENOUS

## 2014-04-09 MED ORDER — FENTANYL CITRATE 0.05 MG/ML IJ SOLN
INTRAMUSCULAR | Status: DC | PRN
  Administered 2014-04-09: 25 ug via INTRAVENOUS
  Administered 2014-04-09 (×3): 50 ug via INTRAVENOUS
  Administered 2014-04-09: 25 ug via INTRAVENOUS

## 2014-04-09 MED ORDER — OXYBUTYNIN CHLORIDE 5 MG PO TABS: 5.0000 mg | ORAL_TABLET | Freq: Four times a day (QID) | ORAL | Status: DC | PRN

## 2014-04-09 MED ORDER — METOCLOPRAMIDE HCL 5 MG/ML IJ SOLN
INTRAMUSCULAR | Status: DC | PRN
  Administered 2014-04-09: 10 mg via INTRAVENOUS

## 2014-04-09 MED ORDER — IOHEXOL 350 MG/ML SOLN
INTRAVENOUS | Status: DC | PRN
  Administered 2014-04-09: 22 mL

## 2014-04-09 MED ORDER — PHENAZOPYRIDINE HCL 100 MG PO TABS: 100.0000 mg | ORAL_TABLET | Freq: Three times a day (TID) | ORAL | Status: DC | PRN

## 2014-04-09 MED ORDER — SUCCINYLCHOLINE CHLORIDE 20 MG/ML IJ SOLN
INTRAMUSCULAR | Status: DC | PRN
  Administered 2014-04-09: 100 mg via INTRAVENOUS

## 2014-04-09 MED ORDER — ACETAMINOPHEN 10 MG/ML IV SOLN
INTRAVENOUS | Status: DC | PRN
  Administered 2014-04-09: 1000 mg via INTRAVENOUS

## 2014-04-09 SURGICAL SUPPLY — 58 items
BAG DRAIN URO-CYSTO SKYTR STRL (DRAIN) ×3 IMPLANT
BAG DRN ANRFLXCHMBR STRAP LEK (BAG)
BAG DRN UROCATH (DRAIN) ×1
BAG URINE DRAINAGE (UROLOGICAL SUPPLIES) ×2 IMPLANT
BAG URINE LEG 19OZ MD ST LTX (BAG) IMPLANT
BASKET LASER NITINOL 1.9FR (BASKET) IMPLANT
BASKET STNLS GEMINI 4WIRE 3FR (BASKET) IMPLANT
BASKET ZERO TIP NITINOL 2.4FR (BASKET) IMPLANT
BSKT STON RTRVL 120 1.9FR (BASKET)
BSKT STON RTRVL GEM 120X11 3FR (BASKET)
BSKT STON RTRVL ZERO TP 2.4FR (BASKET)
CANISTER SUCT LVC 12 LTR MEDI- (MISCELLANEOUS) ×7 IMPLANT
CATH CLEAR GEL 3F BACKSTOP (CATHETERS) IMPLANT
CATH FOLEY 3WAY 30CC 24FR (CATHETERS)
CATH HEMA 3WAY 30CC 24FR COUDE (CATHETERS) ×2 IMPLANT
CATH HEMA 3WAY 30CC 24FR RND (CATHETERS) IMPLANT
CATH INTERMIT  6FR 70CM (CATHETERS) IMPLANT
CATH URET 5FR 28IN CONE TIP (BALLOONS)
CATH URET 5FR 28IN OPEN ENDED (CATHETERS) ×3 IMPLANT
CATH URET 5FR 70CM CONE TIP (BALLOONS) IMPLANT
CATH URET DUAL LUMEN 6-10FR 50 (CATHETERS) IMPLANT
CATH URTH STD 24FR FL 3W 2 (CATHETERS) ×1 IMPLANT
CLOTH BEACON ORANGE TIMEOUT ST (SAFETY) ×3 IMPLANT
DRAPE CAMERA CLOSED 9X96 (DRAPES) ×3 IMPLANT
ELECT BUTTON HF 24-28F 2 30DE (ELECTRODE) IMPLANT
ELECT LOOP MED HF 24F 12D CBL (CLIP) ×3 IMPLANT
ELECT REM PT RETURN 9FT ADLT (ELECTROSURGICAL)
ELECT RESECT VAPORIZE 12D CBL (ELECTRODE) ×3 IMPLANT
ELECTRODE REM PT RTRN 9FT ADLT (ELECTROSURGICAL) ×1 IMPLANT
EVACUATOR MICROVAS BLADDER (UROLOGICAL SUPPLIES) IMPLANT
FIBER LASER FLEXIVA 200 (UROLOGICAL SUPPLIES) IMPLANT
FIBER LASER FLEXIVA 365 (UROLOGICAL SUPPLIES) IMPLANT
GLOVE BIO SURGEON STRL SZ7 (GLOVE) ×3 IMPLANT
GLOVE INDICATOR 7.5 STRL GRN (GLOVE) ×4 IMPLANT
GLOVE SURG SS PI 7.5 STRL IVOR (GLOVE) ×2 IMPLANT
GOWN PREVENTION PLUS LG XLONG (DISPOSABLE) ×1 IMPLANT
GOWN STRL REUS W/ TWL XL LVL3 (GOWN DISPOSABLE) IMPLANT
GOWN STRL REUS W/TWL XL LVL3 (GOWN DISPOSABLE) ×5 IMPLANT
GUIDEWIRE 0.038 PTFE COATED (WIRE) IMPLANT
GUIDEWIRE ANG ZIPWIRE 038X150 (WIRE) ×2 IMPLANT
GUIDEWIRE STR DUAL SENSOR (WIRE) ×3 IMPLANT
HOLDER FOLEY CATH W/STRAP (MISCELLANEOUS) ×2 IMPLANT
IV NS 1000ML (IV SOLUTION) ×6
IV NS 1000ML BAXH (IV SOLUTION) IMPLANT
IV NS IRRIG 3000ML ARTHROMATIC (IV SOLUTION) ×19 IMPLANT
KIT BALLIN UROMAX 15FX10 (LABEL) IMPLANT
KIT BALLN UROMAX 15FX4 (MISCELLANEOUS) IMPLANT
KIT BALLN UROMAX 26 75X4 (MISCELLANEOUS)
PACK CYSTOSCOPY (CUSTOM PROCEDURE TRAY) ×3 IMPLANT
PLUG CATH AND CAP STER (CATHETERS) ×2 IMPLANT
SET ASPIRATION TUBING (TUBING) ×2 IMPLANT
SET HIGH PRES BAL DIL (LABEL)
SHEATH ACCESS URETERAL 38CM (SHEATH) IMPLANT
SHEATH ACCESS URETERAL 54CM (SHEATH) IMPLANT
STENT 6X24 (STENTS) ×2 IMPLANT
STENT URET 6FRX26 CONTOUR (STENTS) ×2 IMPLANT
SYR 30ML LL (SYRINGE) ×2 IMPLANT
SYRINGE IRR TOOMEY STRL 70CC (SYRINGE) ×3 IMPLANT

## 2014-04-09 NOTE — Transfer of Care (Addendum)
Immediate Anesthesia Transfer of Care Note  Patient: Victor Wang  Procedure(s) Performed: Procedure(s) (LRB): TRANSURETHRAL RESECTION OF BLADDER TUMOR WITH GYRUS (TURBT-GYRUS) (Bilateral) CYSTOSCOPY WITH RETROGRADE PYELOGRAM, POSSIBLE URETEROSCOPY WITH BIOPSY AND STENT PLACEMENT (Bilateral)  Patient Location: PACU  Anesthesia Type: General  Level of Consciousness: drowsy, disoriented.  Airway & Oxygen Therapy: Patient Spontanous Breathing and Patient connected to face mask oxygen  Post-op Assessment: Report given to PACU RN and Post -op Vital signs reviewed and stable. Tegaderm applied to patient's right upper arm/elbow by PACU nurse.  Post vital signs: Reviewed and stable  Complications: No apparent anesthesia complications

## 2014-04-09 NOTE — Anesthesia Procedure Notes (Signed)
Procedure Name: Intubation Date/Time: 04/09/2014 3:09 PM Performed by: Mechele Claude Pre-anesthesia Checklist: Patient identified, Emergency Drugs available, Suction available and Patient being monitored Patient Re-evaluated:Patient Re-evaluated prior to inductionOxygen Delivery Method: Circle System Utilized Preoxygenation: Pre-oxygenation with 100% oxygen Intubation Type: IV induction Ventilation: Mask ventilation without difficulty Grade View: Grade I Tube type: Oral Tube size: 8.0 mm Number of attempts: 1 Airway Equipment and Method: stylet Placement Confirmation: ETT inserted through vocal cords under direct vision,  positive ETCO2 and breath sounds checked- equal and bilateral Secured at: 22 cm Tube secured with: Tape Dental Injury: Teeth and Oropharynx as per pre-operative assessment

## 2014-04-09 NOTE — OR Nursing (Signed)
When moving patient to stretcher at completion of case, noted a small tear to skin on the right outer aspect of elbow.  Area dressed with a small tegaderm.     Wiseman, RN, CNOR

## 2014-04-09 NOTE — Anesthesia Preprocedure Evaluation (Addendum)
Anesthesia Evaluation  Patient identified by MRN, date of birth, ID band Patient awake    Reviewed: Allergy & Precautions, H&P , NPO status , Patient's Chart, lab work & pertinent test results  Airway Mallampati: II TM Distance: >3 FB Neck ROM: Full    Dental  (+) Edentulous Upper, Edentulous Lower   Pulmonary neg pulmonary ROS, former smoker,  breath sounds clear to auscultation  Pulmonary exam normal       Cardiovascular hypertension, Pt. on medications and Pt. on home beta blockers Rhythm:Regular Rate:Normal     Neuro/Psych negative neurological ROS  negative psych ROS   GI/Hepatic Neg liver ROS, GERD-  ,  Endo/Other  Morbid obesity  Renal/GU negative Renal ROS  negative genitourinary   Musculoskeletal negative musculoskeletal ROS (+)   Abdominal (+) + obese,   Peds negative pediatric ROS (+)  Hematology negative hematology ROS (+)   Anesthesia Other Findings   Reproductive/Obstetrics negative OB ROS                          Anesthesia Physical Anesthesia Plan  ASA: III  Anesthesia Plan: General   Post-op Pain Management:    Induction: Intravenous  Airway Management Planned: Oral ETT  Additional Equipment:   Intra-op Plan:   Post-operative Plan: Extubation in OR  Informed Consent: I have reviewed the patients History and Physical, chart, labs and discussed the procedure including the risks, benefits and alternatives for the proposed anesthesia with the patient or authorized representative who has indicated his/her understanding and acceptance.   Dental advisory given  Plan Discussed with: CRNA  Anesthesia Plan Comments:        Anesthesia Quick Evaluation

## 2014-04-09 NOTE — Discharge Instructions (Signed)
Post Anesthesia Home Care Instructions  Activity: Get plenty of rest for the remainder of the day. A responsible adult should stay with you for 24 hours following the procedure.  For the next 24 hours, DO NOT: -Drive a car -Paediatric nurse -Drink alcoholic beverages -Take any medication unless instructed by your physician -Make any legal decisions or sign important papers.  Meals: Start with liquid foods such as gelatin or soup. Progress to regular foods as tolerated. Avoid greasy, spicy, heavy foods. If nausea and/or vomiting occur, drink only clear liquids until the nausea and/or vomiting subsides. Call your physician if vomiting continues.  Special Instructions/Symptoms: Your throat may feel dry or sore from the anesthesia or the breathing tube placed in your throat during surgery. If this causes discomfort, gargle with warm salt water. The discomfort should disappear within 24 hours.  Transurethral Resection, Bladder Tumor A cancerous growth (tumor) can develop on the inside wall of the bladder. The bladder is the organ that holds urine. One way to remove the tumor is a procedure called a transurethral resection. The tumor is removed (resected) through the tube that carries urine from the bladder out of the body (urethra). No cuts (incisions) are made in the skin. Instead, the procedure is done through a thin telescope, called a resectoscope. Attached to it is a light and usually a tiny camera. The resectoscope is put into the urethra. In men, the urethra opens at the end of the penis. In women, it opens just above the vagina.  A transurethral resection is usually used to remove tumors that have not gotten too big or too deep. These are called Stage 0, Stage 1 or Stage 2 bladder cancers. LET YOUR CAREGIVER KNOW ABOUT:  On the day of the procedure, your caregivers will need to know the last time you had anything to eat or drink. This includes water, gum, and candy. In advance, make sure  they know about:   Any allergies.  All medications you are taking, including:  Herbs, eyedrops, over-the-counter medications and creams.  Blood thinners (anticoagulants), aspirin or other drugs that could affect blood clotting.  Use of steroids (by mouth or as creams).  Previous problems with anesthetics, including local anesthetics.  Possibility of pregnancy, if this applies.  Any history of blood clots.  Any history of bleeding or other blood problems.  Previous surgery.  Smoking history.  Any recent symptoms of colds or infections.  Other health problems. RISKS AND COMPLICATIONS This is usually a safe procedure. Every procedure has risks, though. For a transurethral resection, they include:  Infection. Antibiotic medication would need to be taken.  Bleeding.  Light bleeding may last for several days after the procedure.  If bleeding continues or is heavy, the bladder may need rinsing. Or, a new catheter might be put in for awhile.  Sometimes bed rest is needed.  Urination problems.  Pain and burning can occur when urinating. This usually goes away in a few days.  Scarring from the procedure can block the flow of urine.  Bladder damage.  It can be punctured or torn during removal of the tumor. If this happens, a catheter might be needed for longer. Antibiotics would be taken while the bladder heals.  Urine can leak through the hole or tear into the abdomen. If this happens, surgery may be needed to repair the bladder. BEFORE THE PROCEDURE   A medical evaluation will be done. This may include:  A physical examination.  Urine test. This is to  make sure you do not have a urinary tract infection.  Blood tests.  A test that checks the heart's rhythm (electrocardiogram).  Talking with an anesthesiologist. This is the person who will be in charge of the medication (anesthesia) to keep you from feeling pain during the transurethral resection. You might be  asleep during the procedure (general anesthesia) or numb from the waist down, but awake during the procedure (spinal anesthesia). Ask your surgeon what to expect.  The person who is having a transurethral resection needs to give what is called informed consent. This requires signing a legal paper that gives permission for the procedure. To give informed consent:  You must understand how the procedure is done and why.  You must be told all the risks and benefits of the procedure.  You must sign the consent. Sometimes a legal guardian can do this.  Signing should be witnessed by a healthcare professional.  The day before the surgery, eat only a light dinner. Then, do not eat or drink anything for at least 8 hours before the surgery. Ask your caregiver if it is OK to take any needed medicines with a sip of water.  Arrive at least an hour before the surgery or whenever your surgeon recommends. This will give you time to check in and fill out any needed paperwork. PROCEDURE  The preparation:  You will change into a hospital gown.  A needle will be inserted in your arm. This is an intravenous access tube (IV). Medication will be able to flow directly into your body through this needle.  Small monitors will be put on your body. They are used to check your heart, blood pressure, and oxygen level.  You might be given medication that will help you relax (sedative).  You will be given a general anesthetic or spinal anesthesia.  The procedure:  Once you are asleep or numb from the waist down, your legs will be placed in stirrups.  The resectoscope will be passed through the urethra into the bladder.  Fluid will be passed through the resectoscope. This will fill the bladder with water.  The surgeon will examine the bladder through the scope. If the scope has a camera, it can take pictures from inside the bladder. They can be projected onto a TV screen.  The surgeon will use various tools to  remove the tumor in small pieces. Sometimes a laser (a beam of light energy) is used. Other tools may use electric current.  A tube (catheter) will often be placed so that urine can drain into a bag outside the body. This process helps stop bleeding. This tube keeps blood clots from blocking the urethra.  The procedure usually takes 30 to 45 minutes. AFTER THE PROCEDURE   You will stay in a recovery area until the anesthesia has worn off. Your blood pressure and pulse will be checked every so often. Then you will be taken to a hospital room.  You may continue to get fluids through the IV for awhile.  Some pain is normal. The catheter might be uncomfortable. Pain is usually not severe. If it is, ask for pain medicine.  Your urine may look bloody after a transurethral resection. This is normal.  If bleeding is heavy, a hospital caregiver may rinse out the bladder (irrigation) through the catheter.  Once the urine is clear, the catheter will be taken out.  You will need to stay in the hospital until you can urinate on your own.  Most people  stay in the hospital for up to 4 days. PROGNOSIS   Transurethral resection is considered the best way to treat bladder tumors that are not too far along. For most people, the treatment is successful. Sometimes, though, more treatment is needed.  Bladder cancers can come back even after a successful procedure. Because of this, be sure to have a checkup with your caregiver every 3 to 6 months. If everything is OK for 3 years, you can reduce the checkups to once a year.  Indwelling Urinary Catheter Care You have been given a flexible tube (catheter) used to drain the bladder. Catheters are often used when a person has difficulty urinating due to blockage, bleeding, infection, or inability to control bladder or bowel movements (incontinence). A catheter requires daily care to prevent infection and blockage. HOME CARE INSTRUCTIONS  Do the following to  reduce the risk of infection. Antibiotic medicines cannot prevent infections. Limit the number of bacteria entering your bladder  Wash your hands for 2 minutes with soapy water before and after handling the catheter.  Wash your bottom and the entire catheter twice daily, as well as after each bowel movement. Wash the tip of the penis or just above the vaginal opening with soap and warm water, rinse, and then wash the rectal area. Always wash from front to back.  When changing from the leg bag to overnight bag or from the overnight bag to leg bag, thoroughly clean the end of the catheter where it connects to the tubing with an alcohol wipe.  Clean the leg bag and overnight bag daily after use. Replace your drainage bags weekly.  Always keep the tubing and bag below the level of your bladder. This allows your urine to drain properly. Lifting the bag or tubing above the level of your bladder will cause dirty urine to flow back into your bladder. If you must briefly lift the bag higher than your bladder, pinch the catheter or tubing to prevent backflow.  Drink enough water and fluids to keep your urine clear or pale yellow, or as directed by your caregiver. This will flush bacteria out of the bladder. Protect tissues from injury  Attach the catheter to your leg so there is no tension on the catheter. Use adhesive tape or a leg strap. If you are using adhesive tape, remove any sticky residue left behind by the previous tape you used.  Place your leg bag on your lower leg. Fasten the straps securely and comfortably.  Do not remove the catheter yourself unless you have been instructed how to do so. Keep the urinary pathway open  Check throughout the day to be sure your catheter is working and urine is draining freely. Make sure the tubing does not become kinked.  Do not let the drainage bag overfill. SEEK IMMEDIATE MEDICAL CARE IF:   The catheter becomes blocked. Urine is not draining.  Urine  is leaking.  You have any pain.  You have a fever. Document Released: 11/14/2005 Document Revised: 10/31/2012 Document Reviewed: 04/15/2010 [facility] Patient Information 2014 [place].  Document Released: 09/10/2009 Document Revised: 02/06/2012 Document Reviewed: 09/10/2009 [facility] Patient Information 2014 [place].

## 2014-04-09 NOTE — H&P (Signed)
Urology History and Physical Exam  CC: Bladder tumor.  HPI:  79 year old male presents today for bladder tumor.  This was discovered during workup for gross hematuria.  Cystoscopy earlier this month revealed several tumors in the bladder which were papillary on the right lateral wall of the bladder.  It was also noted to be a sessile appearing tumor at the bladder neck.  Two papillary bladder tumors measured to 7 years and 1.5 cm apiece.  The bladder neck tumor appears to be at least 3 similar meters in size.  Discussed management options and he presents today for cystoscopy, transurethral resection of bladder tumor, bilateral retrograde pyelograms, possible bilateral ureteroscopy with biopsy and ureter stent placement.  We discussed risks, benefits, alternatives, and likelihood of achieving bubbles.  I've explained to the patient that he has high risk of needing have a Foley catheter placed due to involvement of the bladder neck which just be an area where there is an increased risk for bleeding.  UA 03/31/14 was negative for signs of infection.  PMH: Past Medical History  Diagnosis Date  . Hypertension   . Hyperlipidemia   . GERD (gastroesophageal reflux disease)   . Bladder tumor   . BPH (benign prostatic hypertrophy)   . Hematuria   . Arthritis   . History of compression fracture of spine     04/2013   T12  . Full dentures     PSH: Past Surgical History  Procedure Laterality Date  . Tonsillectomy and adenoidectomy  CHILD  . Appendectomy  1985  . Knee ligament reconstruction Right 1970  . Right shoulder surgery  2011  . Transurethral resection of prostate  2006    AND    POST CAURTERIZATION OF BLEEDERS  . Carpal tunnel release Right 02-05-2008    Allergies: Allergies  Allergen Reactions  . Penicillins Hives and Swelling    Medications: No prescriptions prior to admission     Social History: History   Social History  . Marital Status: Married    Spouse Name: N/A   Number of Children: N/A  . Years of Education: N/A   Occupational History  . Not on file.   Social History Main Topics  . Smoking status: Former Smoker -- 1.50 packs/day for 35 years    Types: Cigarettes  . Smokeless tobacco: Never Used  . Alcohol Use: 3.2 oz/week    2 Cans of beer, 4 Drinks containing 0.5 oz of alcohol per week  . Drug Use: No  . Sexual Activity: Not on file   Other Topics Concern  . Not on file   Social History Narrative  . No narrative on file    Family History: History reviewed. No pertinent family history.  Review of Systems: Positive: Gross hematuria. Negative: Fever, SOB, or chest pain.  A further 10 point review of systems was negative except what is listed in the HPI.  Physical Exam: Filed Vitals:   04/09/14 1304  BP: 143/78  Pulse: 82  Temp: 97.8 F (36.6 C)  Resp: 18    General: No acute distress.  Awake. Head:  Normocephalic.  Atraumatic. ENT:  EOMI.  Mucous membranes moist Neck:  Supple.  No lymphadenopathy. CV:  S1 present. S2 present. Regular rate. Pulmonary: Equal effort bilaterally.  Clear to auscultation bilaterally. Abdomen: Soft.  Non- tender to palpation. Skin:  Normal turgor.  No visible rash. Extremity: No gross deformity of bilateral upper extremities.  No gross deformity of    bilateral lower extremities. Neurologic: Alert.  Appropriate mood.    Studies:  No results found for this basename: HGB, WBC, PLT,  in the last 72 hours  No results found for this basename: NA, K, CL, CO2, BUN, CREATININE, CALCIUM, MAGNESIUM, GFRNONAA, GFRAA,  in the last 72 hours   No results found for this basename: PT, INR, APTT,  in the last 72 hours   No components found with this basename: ABG,     Assessment:  Bladder tumors.  Plan: To OR  for cystoscopy, transurethral resection of bladder tumor, bilateral retrograde pyelograms, possible bilateral ureteroscopy with biopsy and ureter stent placement.

## 2014-04-09 NOTE — Op Note (Signed)
Urology Operative Report  Date of Procedure: 04/09/14  Surgeon: Rolan Bucco, MD Assistant:  None  Preoperative Diagnosis: Bladder tumor. Postoperative Diagnosis:  Same  Procedure(s): Transurethral resection of bladder tumor (greater than 2 cm, less than 5 cm). Bilateral retrograde pyelograms with interpretation. Cystoscopy.  Estimated blood loss: Minimal.  Specimen: Bladder specimen sent in 4 specimens along with biopsy of the bladder neck/prostate for a total of 5 specimen.  Drains: 24 [place] three-way hematuria catheter with 30 cc of water in the balloon.  Complications: None  Findings: Bladder tumors with the largest measuring approximately 3 cm in size. Negative filling defects on bilateral retrograde pyelograms. Enlarged lateral lobes of the prostate bilaterally.  History of present illness: 79 year old male presents today for bladder tumors that were discovered during workup for gross hematuria.   Procedure in detail: After informed consent was obtained, the patient was taken to the operating room. They were placed in the supine position. SCDs were turned on and in place. IV antibiotics were infused, and general anesthesia was induced. A timeout was performed in which the correct patient, surgical site, and procedure were identified and agreed upon by the team.  The patient was placed in a dorsolithotomy position, making sure to pad all pertinent neurovascular pressure points. A belladonna and opium suppository was placed into the rectum. The genitals were prepped and draped in the usual sterile fashion.  A rigid cystoscope was advanced through the urethra and into the bladder. The bladder was drained and then fully distended and evaluated in a systematic fashion. There was noted to be a bladder tumor posterior to the right ureter orifice which was approximately 3 semiurgent size. There was another bladder tumor at the bladder neck at approximately the 7:00 position. There  was also diffuse flat tumor spanning the area between these 2 tumors. It was no involvement of the ureter orifices bilaterally. I initially thought there was a large sessile tumor in the bladder, but this turned out to be lateral lobes of the prostate.  Retrograde pyelograms were obtained:  I obtained a left retrograde pyelogram by cannulating the left ureter orifice with a sensor wire and then placing a 5 French ureter catheter over this. I then injected 10 cc of Omnipaque to obtain a retrograde pyelogram. This was negative for filling defects or hydronephrosis. This side drained well.  Attention was turned to what I believe was the right ureter orifice. I cannulated this with a 5 [place] ureter catheter and injected Omnipaque that this resulted in swelling of the mucosa. It then became apparent that this was simply a fold in the bladder. I searched for the right ureter orifice for an extended period of time and finally was able to identify this. This was cannulated with a sensor wire and then a 5 French catheter was loaded over the sensor wire. I then injected 10 cc of Omnipaque to obtain a right retrograde pyelogram. This was negative for filling defects or hydronephrosis. This side drained well. Because of the close proximity to the tumor I elected to place a right ureter stent during the procedure and I placed a 6 x 24 double-J stent over the sensor wire without a tether. This was only left in place during the resection and was removed when I had completed the resection.  I then elected to proceed with resection of the tumors. Visual obturator to the gyrus resectoscope was placed through the urethra and then the resectoscope was placed with the loop device. Resection was carried out  in normal saline. Attention was turned to the bladder neck tumor first. This is resected in a systematic fashion and the specimen was removed and then the larger tumor was resected in a systematic fashion down to where  strands of the muscle fibers were then applied. Both of these were sent as one specimen.  I then used a cold cup biopsy forcep to biopsy the base of the bladder tumor from the larger tumor site and this was sent as specimen #2. I then sent specimen #3 which was a cold cup biopsy of the base of the bladder neck tumor.  Cold cup biopsy was then obtained from the sessile tumor spanning the gap between the 2 larger tumors. This was sent as specimen #4.  Finally him a I took a biopsy of the right and left lateral lobes of the prostate to ensure that they were not sessile appearing bladder tumor. This was sent as specimen #5.  I then performed electrocautery with the gyrus device and good hemostasis was maintained.  There was no gross perforation of the bladder, but given the fact that I had resected deeply into the muscle, I elected to leave a Foley catheter in place.  The cystoscope was removed and I placed 10 cc of lidocaine jelly into the urethra. I then placed a 24 [place] coud-tip hematuria catheter into the urethra and into the bladder. This was placed to the husband and the balloon was inflated with 30 cc of sterile water. The catheter was placed on traction and irrigated by hand with a [place] syringe. This irrigated to clear.  This completed the procedure, anesthesia was reversed, he was placed in a supine position, and he was taken to the PACU in stable condition.  All counts were correct at the end of the case.  I will give him a course of doxycycline to begin the day before his Foley catheter is removed.

## 2014-04-10 ENCOUNTER — Encounter (HOSPITAL_BASED_OUTPATIENT_CLINIC_OR_DEPARTMENT_OTHER): Payer: Self-pay | Admitting: Urology

## 2014-04-10 DIAGNOSIS — N3020 Other chronic cystitis without hematuria: Secondary | ICD-10-CM | POA: Diagnosis not present

## 2014-04-10 DIAGNOSIS — C679 Malignant neoplasm of bladder, unspecified: Secondary | ICD-10-CM | POA: Diagnosis not present

## 2014-04-14 NOTE — Anesthesia Postprocedure Evaluation (Signed)
  Anesthesia Post-op Note  Patient: Victor Wang  Procedure(s) Performed: Procedure(s) (LRB): TRANSURETHRAL RESECTION OF BLADDER TUMOR WITH GYRUS (TURBT-GYRUS) (Bilateral) CYSTOSCOPY WITH RETROGRADE PYELOGRAM, POSSIBLE URETEROSCOPY WITH BIOPSY AND STENT PLACEMENT (Bilateral)  Patient Location: PACU  Anesthesia Type: General  Level of Consciousness: awake and alert   Airway and Oxygen Therapy: Patient Spontanous Breathing  Post-op Pain: mild  Post-op Assessment: Post-op Vital signs reviewed, Patient's Cardiovascular Status Stable, Respiratory Function Stable, Patent Airway and No signs of Nausea or vomiting  Last Vitals:  Filed Vitals:   04/09/14 1945  BP: 143/80  Pulse: 83  Temp: 36.5 C  Resp: 18    Post-op Vital Signs: stable   Complications: No apparent anesthesia complications

## 2014-04-16 ENCOUNTER — Other Ambulatory Visit: Payer: Self-pay | Admitting: Urology

## 2014-04-22 DIAGNOSIS — C672 Malignant neoplasm of lateral wall of bladder: Secondary | ICD-10-CM | POA: Diagnosis not present

## 2014-04-22 DIAGNOSIS — R339 Retention of urine, unspecified: Secondary | ICD-10-CM | POA: Diagnosis not present

## 2014-04-28 DIAGNOSIS — K2270 Barrett's esophagus without dysplasia: Secondary | ICD-10-CM | POA: Diagnosis not present

## 2014-04-28 DIAGNOSIS — R933 Abnormal findings on diagnostic imaging of other parts of digestive tract: Secondary | ICD-10-CM | POA: Diagnosis not present

## 2014-04-28 DIAGNOSIS — R945 Abnormal results of liver function studies: Secondary | ICD-10-CM | POA: Diagnosis not present

## 2014-04-28 DIAGNOSIS — Z86010 Personal history of colonic polyps: Secondary | ICD-10-CM | POA: Diagnosis not present

## 2014-05-06 DIAGNOSIS — R310 Gross hematuria: Secondary | ICD-10-CM | POA: Diagnosis not present

## 2014-05-08 ENCOUNTER — Other Ambulatory Visit: Payer: Self-pay | Admitting: Urology

## 2014-05-08 MED ORDER — LEVOFLOXACIN IN D5W 750 MG/150ML IV SOLN: 500.0000 mg | Freq: Once | INTRAVENOUS | Status: DC

## 2014-05-15 DIAGNOSIS — N390 Urinary tract infection, site not specified: Secondary | ICD-10-CM | POA: Diagnosis not present

## 2014-05-20 ENCOUNTER — Encounter (HOSPITAL_BASED_OUTPATIENT_CLINIC_OR_DEPARTMENT_OTHER): Payer: Self-pay | Admitting: *Deleted

## 2014-05-21 ENCOUNTER — Encounter (HOSPITAL_BASED_OUTPATIENT_CLINIC_OR_DEPARTMENT_OTHER): Payer: Self-pay | Admitting: *Deleted

## 2014-05-21 NOTE — Progress Notes (Signed)
NPO AFTER MN. ARRIVE AT 1115. NEEDS ISTAT .  CURRENT EKG IN CHART AND EPIC.  WILL TAKE LOPRESSOR AND COZAAR AM DOS W/ SIPS  OF WATER.

## 2014-05-26 ENCOUNTER — Encounter (HOSPITAL_BASED_OUTPATIENT_CLINIC_OR_DEPARTMENT_OTHER): Payer: Medicare Other | Admitting: Anesthesiology

## 2014-05-26 ENCOUNTER — Encounter (HOSPITAL_BASED_OUTPATIENT_CLINIC_OR_DEPARTMENT_OTHER)
Admission: RE | Disposition: A | Discharge status: Home / Self Care | Payer: Self-pay | Source: Ambulatory Visit | Attending: Urology

## 2014-05-26 ENCOUNTER — Encounter (HOSPITAL_BASED_OUTPATIENT_CLINIC_OR_DEPARTMENT_OTHER): Payer: Self-pay | Admitting: *Deleted

## 2014-05-26 ENCOUNTER — Ambulatory Visit (HOSPITAL_BASED_OUTPATIENT_CLINIC_OR_DEPARTMENT_OTHER): Payer: Medicare Other | Admitting: Anesthesiology

## 2014-05-26 ENCOUNTER — Ambulatory Visit (HOSPITAL_BASED_OUTPATIENT_CLINIC_OR_DEPARTMENT_OTHER)
Admission: RE | Admit: 2014-05-26 | Discharge: 2014-05-26 | Disposition: A | Discharge status: Home / Self Care | Payer: Medicare Other | Source: Ambulatory Visit | Attending: Urology | Admitting: Urology

## 2014-05-26 DIAGNOSIS — D649 Anemia, unspecified: Secondary | ICD-10-CM | POA: Insufficient documentation

## 2014-05-26 DIAGNOSIS — I10 Essential (primary) hypertension: Secondary | ICD-10-CM | POA: Diagnosis not present

## 2014-05-26 DIAGNOSIS — K219 Gastro-esophageal reflux disease without esophagitis: Secondary | ICD-10-CM | POA: Diagnosis not present

## 2014-05-26 DIAGNOSIS — Z87891 Personal history of nicotine dependence: Secondary | ICD-10-CM | POA: Diagnosis not present

## 2014-05-26 DIAGNOSIS — Z6841 Body Mass Index (BMI) 40.0 and over, adult: Secondary | ICD-10-CM | POA: Diagnosis not present

## 2014-05-26 DIAGNOSIS — D303 Benign neoplasm of bladder: Secondary | ICD-10-CM | POA: Diagnosis not present

## 2014-05-26 DIAGNOSIS — C679 Malignant neoplasm of bladder, unspecified: Secondary | ICD-10-CM | POA: Diagnosis not present

## 2014-05-26 DIAGNOSIS — C672 Malignant neoplasm of lateral wall of bladder: Secondary | ICD-10-CM | POA: Diagnosis not present

## 2014-05-26 DIAGNOSIS — Z79899 Other long term (current) drug therapy: Secondary | ICD-10-CM | POA: Insufficient documentation

## 2014-05-26 DIAGNOSIS — N3020 Other chronic cystitis without hematuria: Secondary | ICD-10-CM | POA: Diagnosis not present

## 2014-05-26 HISTORY — PX: CYSTOSCOPY WITH BIOPSY: SHX5122

## 2014-05-26 HISTORY — DX: Malignant neoplasm of bladder, unspecified: C67.9

## 2014-05-26 LAB — POCT I-STAT, CHEM 8
BUN: 12 mg/dL (ref 6–23)
CREATININE: 0.7 mg/dL (ref 0.50–1.35)
Calcium, Ion: 1.28 mmol/L (ref 1.13–1.30)
Chloride: 103 mEq/L (ref 96–112)
Glucose, Bld: 115 mg/dL — ABNORMAL HIGH (ref 70–99)
HCT: 42 % (ref 39.0–52.0)
Hemoglobin: 14.3 g/dL (ref 13.0–17.0)
Potassium: 4.7 mEq/L (ref 3.7–5.3)
SODIUM: 139 meq/L (ref 137–147)
TCO2: 26 mmol/L (ref 0–100)

## 2014-05-26 SURGERY — CYSTOSCOPY, WITH BIOPSY
Anesthesia: General | Site: Bladder

## 2014-05-26 MED ORDER — BELLADONNA ALKALOIDS-OPIUM 16.2-60 MG RE SUPP
RECTAL | Status: DC | PRN
  Administered 2014-05-26: 1 via RECTAL

## 2014-05-26 MED ORDER — OXYCODONE-ACETAMINOPHEN 5-325 MG PO TABS: 1.0000 | ORAL_TABLET | ORAL | Status: DC | PRN

## 2014-05-26 MED ORDER — BELLADONNA ALKALOIDS-OPIUM 16.2-60 MG RE SUPP
RECTAL | Status: AC
  Filled 2014-05-26: qty 1

## 2014-05-26 MED ORDER — FENTANYL CITRATE 0.05 MG/ML IJ SOLN
INTRAMUSCULAR | Status: AC
  Filled 2014-05-26: qty 4

## 2014-05-26 MED ORDER — FENTANYL CITRATE 0.05 MG/ML IJ SOLN
INTRAMUSCULAR | Status: DC | PRN
  Administered 2014-05-26 (×2): 50 ug via INTRAVENOUS

## 2014-05-26 MED ORDER — FENTANYL CITRATE 0.05 MG/ML IJ SOLN
25.0000 ug | INTRAMUSCULAR | Status: DC | PRN
  Filled 2014-05-26: qty 1

## 2014-05-26 MED ORDER — LIDOCAINE HCL 2 % EX GEL
CUTANEOUS | Status: DC | PRN
  Administered 2014-05-26: 1 via URETHRAL

## 2014-05-26 MED ORDER — CIPROFLOXACIN IN D5W 400 MG/200ML IV SOLN
400.0000 mg | Freq: Once | INTRAVENOUS | Status: DC
  Filled 2014-05-26: qty 200

## 2014-05-26 MED ORDER — SODIUM CHLORIDE 0.9 % IR SOLN
Status: DC | PRN
  Administered 2014-05-26: 3000 mL

## 2014-05-26 MED ORDER — PHENAZOPYRIDINE HCL 200 MG PO TABS: 200.0000 mg | ORAL_TABLET | Freq: Three times a day (TID) | ORAL | Status: DC | PRN

## 2014-05-26 MED ORDER — PROPOFOL 10 MG/ML IV BOLUS
INTRAVENOUS | Status: DC | PRN
  Administered 2014-05-26: 100 mg via INTRAVENOUS
  Administered 2014-05-26: 200 mg via INTRAVENOUS

## 2014-05-26 MED ORDER — PROMETHAZINE HCL 25 MG/ML IJ SOLN
6.2500 mg | INTRAMUSCULAR | Status: DC | PRN
  Filled 2014-05-26: qty 1

## 2014-05-26 MED ORDER — CIPROFLOXACIN IN D5W 400 MG/200ML IV SOLN
INTRAVENOUS | Status: DC | PRN
  Administered 2014-05-26: 400 mg via INTRAVENOUS

## 2014-05-26 MED ORDER — LACTATED RINGERS IV SOLN
INTRAVENOUS | Status: DC
  Administered 2014-05-26: 12:00:00 via INTRAVENOUS
  Filled 2014-05-26: qty 1000

## 2014-05-26 MED ORDER — CIPROFLOXACIN HCL 500 MG PO TABS: 500.0000 mg | ORAL_TABLET | Freq: Two times a day (BID) | ORAL | Status: DC

## 2014-05-26 MED ORDER — LACTATED RINGERS IV SOLN
INTRAVENOUS | Status: DC
  Filled 2014-05-26: qty 1000

## 2014-05-26 MED ORDER — STERILE WATER FOR IRRIGATION IR SOLN
Status: DC | PRN
  Administered 2014-05-26: 3000 mL

## 2014-05-26 MED ORDER — STERILE WATER FOR IRRIGATION IR SOLN
Status: DC | PRN
  Administered 2014-05-26: 1000 mL

## 2014-05-26 MED ORDER — LIDOCAINE HCL (CARDIAC) 20 MG/ML IV SOLN
INTRAVENOUS | Status: DC | PRN
  Administered 2014-05-26: 50 mg via INTRAVENOUS

## 2014-05-26 MED ORDER — SENNOSIDES-DOCUSATE SODIUM 8.6-50 MG PO TABS: 1.0000 | ORAL_TABLET | Freq: Two times a day (BID) | ORAL | Status: DC

## 2014-05-26 SURGICAL SUPPLY — 34 items
BAG DRAIN URO-CYSTO SKYTR STRL (DRAIN) ×3 IMPLANT
BAG DRN ANRFLXCHMBR STRAP LEK (BAG)
BAG DRN UROCATH (DRAIN) ×2
BAG URINE DRAINAGE (UROLOGICAL SUPPLIES) IMPLANT
BAG URINE LEG 19OZ MD ST LTX (BAG) IMPLANT
CANISTER SUCT LVC 12 LTR MEDI- (MISCELLANEOUS) ×3 IMPLANT
CATH FOLEY 3WAY 30CC 24FR (CATHETERS)
CATH HEMA 3WAY 30CC 24FR COUDE (CATHETERS) IMPLANT
CATH HEMA 3WAY 30CC 24FR RND (CATHETERS) IMPLANT
CATH URTH STD 24FR FL 3W 2 (CATHETERS) ×1 IMPLANT
CLOTH BEACON ORANGE TIMEOUT ST (SAFETY) ×3 IMPLANT
DRAPE CAMERA CLOSED 9X96 (DRAPES) ×3 IMPLANT
ELECT BUTTON HF 24-28F 2 30DE (ELECTRODE) IMPLANT
ELECT LOOP MED HF 24F 12D CBL (CLIP) ×3 IMPLANT
ELECT REM PT RETURN 9FT ADLT (ELECTROSURGICAL) ×3
ELECT RESECT VAPORIZE 12D CBL (ELECTRODE) ×1 IMPLANT
ELECTRODE REM PT RTRN 9FT ADLT (ELECTROSURGICAL) ×2 IMPLANT
EVACUATOR MICROVAS BLADDER (UROLOGICAL SUPPLIES) IMPLANT
GLOVE BIO SURGEON STRL SZ7 (GLOVE) ×3 IMPLANT
GLOVE BIOGEL M 6.5 STRL (GLOVE) ×2 IMPLANT
GLOVE BIOGEL M STER SZ 6 (GLOVE) ×2 IMPLANT
GLOVE INDICATOR 7.5 STRL GRN (GLOVE) IMPLANT
GOWN STRL REIN XL XLG (GOWN DISPOSABLE) ×1 IMPLANT
GOWN STRL REUS W/TWL LRG LVL3 (GOWN DISPOSABLE) ×2 IMPLANT
GOWN STRL REUS W/TWL XL LVL3 (GOWN DISPOSABLE) ×2 IMPLANT
HOLDER FOLEY CATH W/STRAP (MISCELLANEOUS) IMPLANT
NEEDLE HYPO 22GX1.5 SAFETY (NEEDLE) IMPLANT
NS IRRIG 500ML POUR BTL (IV SOLUTION) IMPLANT
PACK CYSTOSCOPY (CUSTOM PROCEDURE TRAY) ×3 IMPLANT
PLUG CATH AND CAP STER (CATHETERS) IMPLANT
SET ASPIRATION TUBING (TUBING) IMPLANT
SYR 30ML LL (SYRINGE) IMPLANT
SYRINGE IRR TOOMEY STRL 70CC (SYRINGE) ×3 IMPLANT
WATER STERILE IRR 3000ML UROMA (IV SOLUTION) ×3 IMPLANT

## 2014-05-26 NOTE — Op Note (Signed)
Urology Operative Report  Date of Procedure: 05/26/14  Surgeon: Rolan Bucco, MD Assistant:  None  Preoperative Diagnosis: Bladder cancer Postoperative Diagnosis:  Same  Procedure(s): Cystoscopy Bladder biopsy.  Estimated blood loss: Minimal  Specimen: Bladder biopsy sent to pathology.  Drains: None  Complications: None  Findings: Right lateral wall resection site intact.  History of present illness: 79 year old male presents today for bladder cancer for a second resection of bladder tumor for staging purposes.   Procedure in detail: After informed consent was obtained, the patient was taken to the operating room. They were placed in the supine position. SCDs were turned on and in place. IV antibiotics were infused, and general anesthesia was induced. A timeout was performed in which the correct patient, surgical site, and procedure were identified and agreed upon by the team.  The patient was placed in a dorsolithotomy position, making sure to pad all pertinent neurovascular pressure points. A belladonna and opium suppository was placed into the rectum. The genitals were prepped and draped in the usual sterile fashion.  A rigid cystoscope was advanced through the urethra and into the bladder. The bladder was drained. The bladder was then fully distended and evaluated in a systematic fashion to visualize the entire surface of the bladder with the 30 and 70 lens. This was negative for recurrent tumors. Previous resection site on the right lateral wall of the bladder was noted to have some necrotic tissue. There was some erythema surrounding this.  Cold cup biopsy forceps were used to extensively biopsy the resection bed and areas around the resection bed as well as the posterior bladder wall. These were sent for permanent pathology.  Biopsy sites were then fulgurated with Bugbee electrocautery in water and in the area around the tumor was cauterized. I was able to identify the  right ureter orifice to avoid cautery in proximity to the structure.  There was good hemostasis. The bladder was drained and the cystoscope was removed.  I placed 10 cc of lidocaine jelly to the urethra. He's placed back in a supine position, anesthesia was reversed, and he was taken to the [facility] in stable condition.  He'll be given 3 days of ciprofloxacin.  All counts were correct at the end of the case.

## 2014-05-26 NOTE — H&P (Signed)
Urology History and Physical Exam  CC: Bladder cancer.  HPI:  79 year old male presents today for bladder cancer.  This was discovered based on workup for gross hematuria.  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  This was resected 04/07/14 with transurethral resection of bladder tumor.  This was noted to be a non-muscle invasive cancer, but according to standard of care we have recommended repeating bladder for which he presents for today.  We have discussed risks, benefits, alternatives, and likelihood of achieving goals. Urine cultures 05/15/49 was negative for growth.  He presents today for cystoscopy, and bladder biopsy.  PMH: Past Medical History  Diagnosis Date  . Hypertension   . Hyperlipidemia   . GERD (gastroesophageal reflux disease)   . BPH (benign prostatic hypertrophy)   . Hematuria   . Arthritis   . History of compression fracture of spine     04/2013   T12  . Full dentures   . Bladder cancer   . At risk for sleep apnea     STOP-BANG= 6   SENT TO PCP 04-09-2014    PSH: Past Surgical History  Procedure Laterality Date  . Tonsillectomy and adenoidectomy  CHILD  . Appendectomy  1985  . Knee ligament reconstruction Right 1970  . Right shoulder surgery  2011  . Transurethral resection of prostate  2006    AND    POST CAURTERIZATION OF BLEEDERS  . Carpal tunnel release Right 02-05-2008  . Transurethral resection of bladder tumor with gyrus (turbt-gyrus) Bilateral 04/09/2014    Procedure: TRANSURETHRAL RESECTION OF BLADDER TUMOR WITH GYRUS (TURBT-GYRUS);  Surgeon: Sharyn Creamer, MD;  Location[facility];  Service: Urology;  Laterality: Bilateral;  . Cystoscopy with retrograde pyelogram, ureteroscopy and stent placement Bilateral 04/09/2014    Procedure: CYSTOSCOPY WITH RETROGRADE PYELOGRAM, POSSIBLE URETEROSCOPY WITH BIOPSY AND STENT PLACEMENT;  Surgeon: Sharyn Creamer, MD;  Location[facility];  Service: Urology;  Laterality: Bilateral;    Allergies: Allergies  Allergen Reactions  . Penicillins Hives and Swelling    Medications: No prescriptions prior to admission     Social History: History   Social History  . Marital Status: Married    Spouse Name: N/A    Number of Children: N/A  . Years of Education: N/A   Occupational History  . Not on file.   Social History Main Topics  . Smoking status: Former Smoker -- 1.50 packs/day for 35 years    Types: Cigarettes  . Smokeless tobacco: Never Used  . Alcohol Use: 3.2 oz/week    2 Cans of beer, 4 Drinks containing 0.5 oz of alcohol per week  . Drug Use: No  . Sexual Activity: Not on file   Other Topics Concern  . Not on file   Social History Narrative  . No narrative on file    Family History: History reviewed. No pertinent family history.  Review of Systems: Positive: None. Negative: Chest pain, SOB, or fever.  A further 10 point review of systems was negative except what is listed in the HPI.  Physical Exam: Filed Vitals:   05/26/14 1124  BP: 150/81  Pulse: 70  Temp: 98.5 F (36.9 C)  Resp: 18    General: No acute distress.  Awake. Head:  Normocephalic.  Atraumatic. ENT:  EOMI.  Mucous membranes moist Neck:  Supple.  No lymphadenopathy. CV:  S1 present. S2 present. Regular rate. Pulmonary: Equal effort bilaterally.  Clear to auscultation  bilaterally. Abdomen: Soft.  Non- tender to palpation. Skin:  Normal turgor.  No visible rash. Extremity: No gross deformity of bilateral upper extremities.  No gross deformity of    bilateral lower extremities. Neurologic: Alert. Appropriate mood.    Studies:  No results found for this basename: HGB, WBC, PLT,  in the last 72 hours  No results found for this basename: NA, K, CL, CO2, BUN, CREATININE, CALCIUM, MAGNESIUM, GFRNONAA, GFRAA,  in the last 72 hours   No results found for this basename: PT, INR, APTT,  in the last 72 hours   No  components found with this basename: ABG,     Assessment:  Bladder cancer.  Plan: To OR for cystoscopy, and bladder biopsy.

## 2014-05-26 NOTE — Anesthesia Procedure Notes (Signed)
Procedure Name: LMA Insertion Date/Time: 05/26/2014 12:57 PM Performed by: Bethena Roys T Pre-anesthesia Checklist: Patient identified, Emergency Drugs available, Suction available and Patient being monitored Patient Re-evaluated:Patient Re-evaluated prior to inductionOxygen Delivery Method: Circle System Utilized Preoxygenation: Pre-oxygenation with 100% oxygen Intubation Type: IV induction Ventilation: Mask ventilation without difficulty LMA: LMA with gastric port inserted LMA Size: 5.0 Number of attempts: 1 Placement Confirmation: positive ETCO2 Tube secured with: Tape Dental Injury: Teeth and Oropharynx as per pre-operative assessment

## 2014-05-26 NOTE — Anesthesia Postprocedure Evaluation (Signed)
Anesthesia Post Note  Patient: Victor Wang  Procedure(s) Performed: Procedure(s) (LRB): CYSTOSCOPY WITH BIOPSY (N/A)  Anesthesia type: General  Patient location: PACU  Post pain: Pain level controlled  Post assessment: Post-op Vital signs reviewed  Last Vitals: BP 143/94  Pulse 67  Temp(Src) 36.5 C (Oral)  Resp 18  Ht 6' (1.829 m)  Wt 324 lb (146.965 kg)  BMI 43.93 kg/m2  SpO2 92%  Post vital signs: Reviewed  Level of consciousness: sedated  Complications: No apparent anesthesia complications

## 2014-05-26 NOTE — Anesthesia Preprocedure Evaluation (Signed)
Anesthesia Evaluation  Patient identified by MRN, date of birth, ID band Patient awake    Reviewed: Allergy & Precautions, H&P , NPO status , Patient's Chart, lab work & pertinent test results  Airway Mallampati: II TM Distance: >3 FB Neck ROM: Full    Dental  (+) Edentulous Upper, Edentulous Lower   Pulmonary neg pulmonary ROS, former smoker,  breath sounds clear to auscultation  Pulmonary exam normal       Cardiovascular hypertension, Pt. on medications and Pt. on home beta blockers Rhythm:Regular Rate:Normal     Neuro/Psych negative neurological ROS  negative psych ROS   GI/Hepatic Neg liver ROS, GERD-  ,  Endo/Other  negative endocrine ROSMorbid obesity  Renal/GU negative Renal ROS  negative genitourinary   Musculoskeletal negative musculoskeletal ROS (+)   Abdominal (+) + obese,   Peds negative pediatric ROS (+)  Hematology  (+) anemia ,   Anesthesia Other Findings   Reproductive/Obstetrics                           Anesthesia Physical Anesthesia Plan  ASA: III  Anesthesia Plan: General   Post-op Pain Management:    Induction: Intravenous  Airway Management Planned: LMA  Additional Equipment:   Intra-op Plan:   Post-operative Plan: Extubation in OR  Informed Consent: I have reviewed the patients History and Physical, chart, labs and discussed the procedure including the risks, benefits and alternatives for the proposed anesthesia with the patient or authorized representative who has indicated his/her understanding and acceptance.   Dental advisory given  Plan Discussed with: CRNA  Anesthesia Plan Comments:         Anesthesia Quick Evaluation

## 2014-05-26 NOTE — Transfer of Care (Signed)
Immediate Anesthesia Transfer of Care Note  Patient: Victor Wang  Procedure(s) Performed: Procedure(s): CYSTOSCOPY WITH BIOPSY (N/A)  Patient Location: PACU  Anesthesia Type:General  Level of Consciousness: awake and oriented  Airway & Oxygen Therapy: Patient Spontanous Breathing and Patient connected to face mask oxygen  Post-op Assessment: Report given to PACU RN  Post vital signs: Reviewed and stable  Complications: No apparent anesthesia complications

## 2014-05-26 NOTE — Discharge Instructions (Addendum)
DISCHARGE INSTRUCTIONS FOR TRANSURETHRAL SURGERY OF BLADDER  MEDICATIONS:   1. Resume all your other meds from home.    ACTIVITY 1. No heavy lifting >10 pounds for 2 weeks 2. No sexual activity for 2 weeks 3. No strenuous activity for 2 weeks 4. No driving while on narcotic pain medications 5. Drink plenty of water 6. Continue to walk at home - you can still get blood clots when you are at  home, so keep active, but don't over do it. 7. Your urine may have some blood in it - make sure you drink plenty of water,  call or come to the ER immediately if your catheter stops draining  FOLEY CATHETER  (If you go home with a catheter in place.) 1. Make sure your catheter is attached to your leg at all times - do not let  anything pull on it 2. If the urine is your tube starts looking dark red or if it stops draining,  call us immediately or come to the ER 3. Drink plenty of water, if you do notice your urine looking darking, sit down,  relax and drink lots of water 4. You will be given a leg bag as well as an overnight bag for your catheter -  MAKE SURE ATTACHED TO YOUR LEG AT ALL TIMES WITH TAPE OR LEG STRAP  BATHING 1. You can shower.  You may take a bath unless you have a Foley catheter in place.  SIGNS/SYMPTOMS TO CALL: 1. Please call us if you have a fever greater than 101.5, uncontrolled  nausea/vomiting, uncontrolled pain, dizziness, unable to urinate, chest pain, shortness of breath, leg swelling, leg pain, or any other concerns  or questions.  You can reach [place] at [phone].   Post Anesthesia Home Care Instructions  Activity: Get plenty of rest for the remainder of the day. A responsible adult should stay with you for 24 hours following the procedure.  For the next 24 hours, DO NOT: -Drive a car -Paediatric nurse -Drink alcoholic beverages -Take any medication unless instructed by your physician -Make any legal decisions or sign important papers.  Meals: Start  with liquid foods such as gelatin or soup. Progress to regular foods as tolerated. Avoid greasy, spicy, heavy foods. If nausea and/or vomiting occur, drink only clear liquids until the nausea and/or vomiting subsides. Call your physician if vomiting continues.  Special Instructions/Symptoms: Your throat may feel dry or sore from the anesthesia or the breathing tube placed in your throat during surgery. If this causes discomfort, gargle with warm salt water. The discomfort should disappear within 24 hours.

## 2014-05-27 ENCOUNTER — Encounter (HOSPITAL_BASED_OUTPATIENT_CLINIC_OR_DEPARTMENT_OTHER): Payer: Self-pay | Admitting: Urology

## 2014-06-02 DIAGNOSIS — C672 Malignant neoplasm of lateral wall of bladder: Secondary | ICD-10-CM | POA: Diagnosis not present

## 2014-07-16 DIAGNOSIS — C672 Malignant neoplasm of lateral wall of bladder: Secondary | ICD-10-CM | POA: Diagnosis not present

## 2014-07-23 DIAGNOSIS — N390 Urinary tract infection, site not specified: Secondary | ICD-10-CM | POA: Diagnosis not present

## 2014-07-30 DIAGNOSIS — C672 Malignant neoplasm of lateral wall of bladder: Secondary | ICD-10-CM | POA: Diagnosis not present

## 2014-08-06 DIAGNOSIS — C672 Malignant neoplasm of lateral wall of bladder: Secondary | ICD-10-CM | POA: Diagnosis not present

## 2014-08-13 DIAGNOSIS — C672 Malignant neoplasm of lateral wall of bladder: Secondary | ICD-10-CM | POA: Diagnosis not present

## 2014-08-20 DIAGNOSIS — C672 Malignant neoplasm of lateral wall of bladder: Secondary | ICD-10-CM | POA: Diagnosis not present

## 2014-08-27 DIAGNOSIS — C672 Malignant neoplasm of lateral wall of bladder: Secondary | ICD-10-CM | POA: Diagnosis not present

## 2014-09-03 DIAGNOSIS — H2513 Age-related nuclear cataract, bilateral: Secondary | ICD-10-CM | POA: Diagnosis not present

## 2014-10-06 DIAGNOSIS — C672 Malignant neoplasm of lateral wall of bladder: Secondary | ICD-10-CM | POA: Diagnosis not present

## 2014-10-06 DIAGNOSIS — N289 Disorder of kidney and ureter, unspecified: Secondary | ICD-10-CM | POA: Diagnosis not present

## 2014-10-07 ENCOUNTER — Other Ambulatory Visit: Payer: Self-pay | Admitting: Urology

## 2014-10-13 DIAGNOSIS — Z23 Encounter for immunization: Secondary | ICD-10-CM | POA: Diagnosis not present

## 2014-10-13 DIAGNOSIS — I10 Essential (primary) hypertension: Secondary | ICD-10-CM | POA: Diagnosis not present

## 2014-10-20 ENCOUNTER — Encounter (HOSPITAL_BASED_OUTPATIENT_CLINIC_OR_DEPARTMENT_OTHER): Payer: Self-pay | Admitting: *Deleted

## 2014-10-22 ENCOUNTER — Encounter (HOSPITAL_BASED_OUTPATIENT_CLINIC_OR_DEPARTMENT_OTHER): Payer: Self-pay | Admitting: *Deleted

## 2014-10-22 NOTE — Progress Notes (Signed)
SPOKE W/ WIFE. NPO AFTER MN. ARRIVE AT 0700. NEEDS ISTAT 8. CURRENT EKG IN CHART AND EPIC. WILL TAKE LOSARTAN AND LOPRESSOR AM DOS W/ SIPS OF WATER.

## 2014-10-29 ENCOUNTER — Ambulatory Visit (HOSPITAL_BASED_OUTPATIENT_CLINIC_OR_DEPARTMENT_OTHER)
Admission: RE | Admit: 2014-10-29 | Discharge: 2014-10-29 | Disposition: A | Discharge status: Home / Self Care | Payer: Medicare Other | Source: Ambulatory Visit | Attending: Urology | Admitting: Urology

## 2014-10-29 ENCOUNTER — Encounter (HOSPITAL_BASED_OUTPATIENT_CLINIC_OR_DEPARTMENT_OTHER)
Admission: RE | Disposition: A | Discharge status: Home / Self Care | Payer: Self-pay | Source: Ambulatory Visit | Attending: Urology

## 2014-10-29 ENCOUNTER — Ambulatory Visit (HOSPITAL_BASED_OUTPATIENT_CLINIC_OR_DEPARTMENT_OTHER): Payer: Medicare Other | Admitting: Anesthesiology

## 2014-10-29 ENCOUNTER — Encounter (HOSPITAL_BASED_OUTPATIENT_CLINIC_OR_DEPARTMENT_OTHER): Payer: Self-pay | Admitting: Anesthesiology

## 2014-10-29 DIAGNOSIS — N400 Enlarged prostate without lower urinary tract symptoms: Secondary | ICD-10-CM | POA: Insufficient documentation

## 2014-10-29 DIAGNOSIS — Z87891 Personal history of nicotine dependence: Secondary | ICD-10-CM | POA: Diagnosis not present

## 2014-10-29 DIAGNOSIS — Z91030 Bee allergy status: Secondary | ICD-10-CM | POA: Diagnosis not present

## 2014-10-29 DIAGNOSIS — Z6841 Body Mass Index (BMI) 40.0 and over, adult: Secondary | ICD-10-CM | POA: Diagnosis not present

## 2014-10-29 DIAGNOSIS — C680 Malignant neoplasm of urethra: Secondary | ICD-10-CM | POA: Insufficient documentation

## 2014-10-29 DIAGNOSIS — C679 Malignant neoplasm of bladder, unspecified: Secondary | ICD-10-CM | POA: Diagnosis not present

## 2014-10-29 DIAGNOSIS — N3289 Other specified disorders of bladder: Secondary | ICD-10-CM | POA: Insufficient documentation

## 2014-10-29 DIAGNOSIS — Z880 Allergy status to penicillin: Secondary | ICD-10-CM | POA: Insufficient documentation

## 2014-10-29 DIAGNOSIS — M1990 Unspecified osteoarthritis, unspecified site: Secondary | ICD-10-CM | POA: Diagnosis not present

## 2014-10-29 DIAGNOSIS — I10 Essential (primary) hypertension: Secondary | ICD-10-CM | POA: Diagnosis not present

## 2014-10-29 DIAGNOSIS — K219 Gastro-esophageal reflux disease without esophagitis: Secondary | ICD-10-CM | POA: Insufficient documentation

## 2014-10-29 DIAGNOSIS — E785 Hyperlipidemia, unspecified: Secondary | ICD-10-CM | POA: Diagnosis not present

## 2014-10-29 DIAGNOSIS — C672 Malignant neoplasm of lateral wall of bladder: Secondary | ICD-10-CM | POA: Diagnosis not present

## 2014-10-29 HISTORY — PX: CYSTOSCOPY W/ RETROGRADES: SHX1426

## 2014-10-29 HISTORY — PX: TRANSURETHRAL RESECTION OF BLADDER TUMOR WITH GYRUS (TURBT-GYRUS): SHX6458

## 2014-10-29 LAB — POCT I-STAT, CHEM 8
BUN: 9 mg/dL (ref 6–23)
CREATININE: 0.6 mg/dL (ref 0.50–1.35)
Calcium, Ion: 0.99 mmol/L — ABNORMAL LOW (ref 1.13–1.30)
Chloride: 104 mEq/L (ref 96–112)
Glucose, Bld: 141 mg/dL — ABNORMAL HIGH (ref 70–99)
HCT: 40 % (ref 39.0–52.0)
Hemoglobin: 13.6 g/dL (ref 13.0–17.0)
Potassium: 4.2 mEq/L (ref 3.7–5.3)
SODIUM: 136 meq/L — AB (ref 137–147)
TCO2: 23 mmol/L (ref 0–100)

## 2014-10-29 SURGERY — TRANSURETHRAL RESECTION OF BLADDER TUMOR WITH GYRUS (TURBT-GYRUS)
Anesthesia: General | Site: Ureter

## 2014-10-29 MED ORDER — ONDANSETRON HCL 4 MG/2ML IJ SOLN
INTRAMUSCULAR | Status: DC | PRN
  Administered 2014-10-29: 4 mg via INTRAVENOUS

## 2014-10-29 MED ORDER — FENTANYL CITRATE 0.05 MG/ML IJ SOLN
25.0000 ug | INTRAMUSCULAR | Status: DC | PRN
  Filled 2014-10-29: qty 1

## 2014-10-29 MED ORDER — FENTANYL CITRATE 0.05 MG/ML IJ SOLN
INTRAMUSCULAR | Status: DC | PRN
Start: 2014-10-29 — End: 2014-10-29
  Administered 2014-10-29: 100 ug via INTRAVENOUS

## 2014-10-29 MED ORDER — IOHEXOL 350 MG/ML SOLN
INTRAVENOUS | Status: DC | PRN
  Administered 2014-10-29: 17 mL

## 2014-10-29 MED ORDER — GENTAMICIN IN SALINE 1.6-0.9 MG/ML-% IV SOLN
80.0000 mg | INTRAVENOUS | Status: DC
  Administered 2014-10-29: 540 mg via INTRAVENOUS
  Filled 2014-10-29: qty 50

## 2014-10-29 MED ORDER — BELLADONNA ALKALOIDS-OPIUM 16.2-60 MG RE SUPP
RECTAL | Status: AC
  Filled 2014-10-29: qty 1

## 2014-10-29 MED ORDER — LACTATED RINGERS IV SOLN
INTRAVENOUS | Status: DC
  Administered 2014-10-29 (×2): via INTRAVENOUS
  Filled 2014-10-29: qty 1000

## 2014-10-29 MED ORDER — DEXTROSE 5 % IV SOLN
540.0000 mg | INTRAVENOUS | Status: DC
  Filled 2014-10-29: qty 13.5

## 2014-10-29 MED ORDER — DEXAMETHASONE SODIUM PHOSPHATE 4 MG/ML IJ SOLN
INTRAMUSCULAR | Status: DC | PRN
  Administered 2014-10-29: 10 mg via INTRAVENOUS

## 2014-10-29 MED ORDER — MIDAZOLAM HCL 5 MG/5ML IJ SOLN
INTRAMUSCULAR | Status: DC | PRN
  Administered 2014-10-29: 2 mg via INTRAVENOUS

## 2014-10-29 MED ORDER — ACETAMINOPHEN 10 MG/ML IV SOLN
INTRAVENOUS | Status: DC | PRN
  Administered 2014-10-29: 1000 mg via INTRAVENOUS

## 2014-10-29 MED ORDER — SODIUM CHLORIDE 0.9 % IR SOLN
Status: DC | PRN
  Administered 2014-10-29: 6000 mL

## 2014-10-29 MED ORDER — SENNOSIDES-DOCUSATE SODIUM 8.6-50 MG PO TABS: 1.0000 | ORAL_TABLET | Freq: Two times a day (BID) | ORAL | Status: DC

## 2014-10-29 MED ORDER — ONDANSETRON HCL 4 MG/2ML IJ SOLN
4.0000 mg | Freq: Once | INTRAMUSCULAR | Status: DC | PRN
  Filled 2014-10-29: qty 2

## 2014-10-29 MED ORDER — TRAMADOL HCL 50 MG PO TABS: 50.0000 mg | ORAL_TABLET | Freq: Four times a day (QID) | ORAL | Status: DC | PRN

## 2014-10-29 MED ORDER — MIDAZOLAM HCL 2 MG/2ML IJ SOLN
INTRAMUSCULAR | Status: AC
  Filled 2014-10-29: qty 2

## 2014-10-29 MED ORDER — PROPOFOL 10 MG/ML IV BOLUS
INTRAVENOUS | Status: DC | PRN
  Administered 2014-10-29: 3 mg via INTRAVENOUS
  Administered 2014-10-29: 200 mg via INTRAVENOUS

## 2014-10-29 MED ORDER — FENTANYL CITRATE 0.05 MG/ML IJ SOLN
INTRAMUSCULAR | Status: AC
  Filled 2014-10-29: qty 4

## 2014-10-29 MED ORDER — LIDOCAINE HCL (CARDIAC) 20 MG/ML IV SOLN
INTRAVENOUS | Status: DC | PRN
  Administered 2014-10-29: 100 mg via INTRAVENOUS

## 2014-10-29 SURGICAL SUPPLY — 33 items
BAG DRN ANRFLXCHMBR STRAP LEK (BAG)
BAG URINE DRAINAGE (UROLOGICAL SUPPLIES) IMPLANT
BAG URINE LEG 19OZ MD ST LTX (BAG) IMPLANT
BAG URINE LEG 500ML (DRAIN) IMPLANT
BAG URO CATCHER STRL LF (DRAPE) ×4 IMPLANT
BASKET ZERO TIP NITINOL 2.4FR (BASKET) IMPLANT
BSKT STON RTRVL ZERO TP 2.4FR (BASKET)
CATH FOLEY 2WAY SLVR  5CC 22FR (CATHETERS)
CATH FOLEY 2WAY SLVR 30CC 20FR (CATHETERS) IMPLANT
CATH FOLEY 2WAY SLVR 5CC 22FR (CATHETERS) IMPLANT
CATH INTERMIT  6FR 70CM (CATHETERS) ×2 IMPLANT
CLOTH BEACON ORANGE TIMEOUT ST (SAFETY) ×4 IMPLANT
DRAPE CAMERA CLOSED 9X96 (DRAPES) ×4 IMPLANT
ELECT LOOP MED HF 24F 12D CBL (CLIP) ×2 IMPLANT
ELECT REM PT RETURN 9FT ADLT (ELECTROSURGICAL)
ELECT RESECT VAPORIZE 12D CBL (ELECTRODE) IMPLANT
ELECTRODE REM PT RTRN 9FT ADLT (ELECTROSURGICAL) ×2 IMPLANT
EVACUATOR MICROVAS BLADDER (UROLOGICAL SUPPLIES) IMPLANT
GLOVE BIO SURGEON STRL SZ 6.5 (GLOVE) ×1 IMPLANT
GLOVE BIO SURGEON STRL SZ7.5 (GLOVE) ×4 IMPLANT
GLOVE BIO SURGEONS STRL SZ 6.5 (GLOVE) ×1
GLOVE BIOGEL PI IND STRL 6.5 (GLOVE) IMPLANT
GLOVE BIOGEL PI INDICATOR 6.5 (GLOVE) ×4
GOWN PREVENTION PLUS XLARGE (GOWN DISPOSABLE) ×2 IMPLANT
GOWN STRL NON-REIN LRG LVL3 (GOWN DISPOSABLE) ×4 IMPLANT
GOWN STRL REUS W/TWL LRG LVL3 (GOWN DISPOSABLE) ×2 IMPLANT
GOWN STRL REUS W/TWL XL LVL3 (GOWN DISPOSABLE) ×2 IMPLANT
GUIDEWIRE ANG ZIPWIRE 038X150 (WIRE) IMPLANT
GUIDEWIRE STR DUAL SENSOR (WIRE) ×4 IMPLANT
IV NS IRRIG 3000ML ARTHROMATIC (IV SOLUTION) ×6 IMPLANT
PACK CYSTO (CUSTOM PROCEDURE TRAY) ×4 IMPLANT
SET ASPIRATION TUBING (TUBING) IMPLANT
SYRINGE IRR TOOMEY STRL 70CC (SYRINGE) IMPLANT

## 2014-10-29 NOTE — Brief Op Note (Signed)
10/29/2014  9:15 AM  PATIENT:  Victor Wang  79 y.o. male  PRE-OPERATIVE DIAGNOSIS:  RECURRENT BLADDER CANCER  POST-OPERATIVE DIAGNOSIS:  RECURRENT BLADDER CANCER  PROCEDURE:  Procedure(s): TRANSURETHRAL RESECTION OF BLADDER TUMOR WITH GYRUS (TURBT-GYRUS) (N/A) CYSTOSCOPY WITH RETROGRADE PYELOGRAM (Bilateral)  SURGEON:  Surgeon(s) and Role:    * Alexis Frock, MD - Primary  PHYSICIAN ASSISTANT:   ASSISTANTS: none   ANESTHESIA:   general  EBL:  Total I/O In: 550 [I.V.:550] Out: -   BLOOD ADMINISTERED:none  DRAINS: none   LOCAL MEDICATIONS USED:  NONE  SPECIMEN:  Source of Specimen:  1 - bladder dome tumors, 2- prostatic urethra tumor  DISPOSITION OF SPECIMEN:  PATHOLOGY  COUNTS:  YES  TOURNIQUET:  * No tourniquets in log *  DICTATION: .Other Dictation: Dictation Number [id number]  PLAN OF CARE: Discharge to home after PACU  PATIENT DISPOSITION:  PACU - hemodynamically stable.   Delay start of Pharmacological VTE agent (>24hrs) due to surgical blood loss or risk of bleeding: not applicable

## 2014-10-29 NOTE — Anesthesia Preprocedure Evaluation (Addendum)
Anesthesia Evaluation  Patient identified by MRN, date of birth, ID band Patient awake    Reviewed: Allergy & Precautions, H&P , NPO status , Patient's Chart, lab work & pertinent test results  History of Anesthesia Complications Negative for: history of anesthetic complications  Airway Mallampati: II  TM Distance: >3 FB Neck ROM: Full    Dental no notable dental hx. (+) Edentulous Upper, Edentulous Lower   Pulmonary former smoker,  Positive cough breath sounds clear to auscultation  Pulmonary exam normal       Cardiovascular hypertension, Pt. on medications and Pt. on home beta blockers Rhythm:Regular Rate:Normal     Neuro/Psych negative neurological ROS  negative psych ROS   GI/Hepatic Neg liver ROS, GERD-  Medicated and Controlled,  Endo/Other  Morbid obesity  Renal/GU negative Renal ROS Bladder dysfunction      Musculoskeletal  (+) Arthritis -, Osteoarthritis,    Abdominal (+) + obese,   Peds negative pediatric ROS (+)  Hematology negative hematology ROS (+)   Anesthesia Other Findings   Reproductive/Obstetrics negative OB ROS                       Anesthesia Physical Anesthesia Plan  ASA: III  Anesthesia Plan: General   Post-op Pain Management:    Induction: Intravenous  Airway Management Planned: LMA  Additional Equipment:   Intra-op Plan:   Post-operative Plan: Extubation in OR  Informed Consent:   Dental advisory given  Plan Discussed with: CRNA and Anesthesiologist  Anesthesia Plan Comments:         Anesthesia Quick Evaluation

## 2014-10-29 NOTE — H&P (Signed)
Victor Wang is an 79 y.o. male.    Chief Complaint: Pre-Op Transurethral Resection of Bladder Tumor  HPI:   1- High Grade Bladder Cancer - Found initially 2015 on eval gross hematuria. Former smoker and cytoxan use.  Recent History: 03/2014 TaG3, CT clinically localized --> ReTURBT 04/2014 NED --> Induction BCG x6 09/2014 Surv cysto with 3 small papillary lesions <1cm each dome and lateral walls  PMH sig for obesity, HTN, Appy. Denis CVA or ischemic heart disease. His PCP is Dr Victor Wang  Today " Victor Wang " is seen to proceed with TURBT for his small volume bladder cancer recurrence. No interval fevers. Most recent UA without infectious parameters.   Past Medical History  Diagnosis Date  . Hypertension   . Hyperlipidemia   . GERD (gastroesophageal reflux disease)   . BPH (benign prostatic hypertrophy)   . Arthritis   . History of compression fracture of spine     04/2013   T12  . Full dentures   . At risk for sleep apnea     STOP-BANG= 6   SENT TO PCP 04-09-2014  . Bladder cancer     RECURRENT    Past Surgical History  Procedure Laterality Date  . Tonsillectomy and adenoidectomy  CHILD  . Appendectomy  1985  . Knee ligament reconstruction Right 1970  . Right shoulder surgery  2011  . Transurethral resection of prostate  2006    AND    POST CAURTERIZATION OF BLEEDERS  . Carpal tunnel release Right 02-05-2008  . Transurethral resection of bladder tumor with gyrus (turbt-gyrus) Bilateral 04/09/2014    Procedure: TRANSURETHRAL RESECTION OF BLADDER TUMOR WITH GYRUS (TURBT-GYRUS);  Surgeon: Sharyn Creamer, MD;  Location: [facility];  Service: Urology;  Laterality: Bilateral;  . Cystoscopy with retrograde pyelogram, ureteroscopy and stent placement Bilateral 04/09/2014    Procedure: CYSTOSCOPY WITH RETROGRADE PYELOGRAM, POSSIBLE URETEROSCOPY WITH BIOPSY AND STENT PLACEMENT;  Surgeon: Sharyn Creamer, MD;  Location: [facility];  Service: Urology;   Laterality: Bilateral;  . Cystoscopy with biopsy N/A 05/26/2014    Procedure: CYSTOSCOPY WITH BIOPSY;  Surgeon: Sharyn Creamer, MD;  Location: [facility];  Service: Urology;  Laterality: N/A;    History reviewed. No pertinent family history. Social History:  reports that he has quit smoking. His smoking use included Cigarettes. He has a 52.5 pack-year smoking history. He has never used smokeless tobacco. He reports that he drinks about 3.2 oz of alcohol per week. He reports that he does not use illicit drugs.  Allergies:  Allergies  Allergen Reactions  . Penicillins Hives and Swelling    No prescriptions prior to admission    No results found for this or any previous visit (from the past 48 hour(s)). No results found.  Review of Systems  Constitutional: Negative.  Negative for fever and chills.  HENT: Negative.   Eyes: Negative.   Respiratory: Negative.   Cardiovascular: Negative.   Gastrointestinal: Negative.   Genitourinary: Negative.   Musculoskeletal: Negative.   Skin: Negative.   Neurological: Negative.   Endo/Heme/Allergies: Negative.   Psychiatric/Behavioral: Negative.     Height 6' (1.829 m), weight 146.965 kg (324 lb). Physical Exam  Constitutional: He appears well-developed.  HENT:  Head: Normocephalic.  Eyes: Pupils are equal, round, and reactive to light.  Neck: Normal range of motion. Neck supple.  Cardiovascular: Normal rate and regular rhythm.   Respiratory: Effort normal.  GI: Soft.  Genitourinary: Penis normal.  Musculoskeletal: Normal  range of motion.  Neurological: He is alert.  Skin: Skin is warm and dry.  Psychiatric: He has a normal mood and affect. His behavior is normal. Judgment and thought content normal.     Assessment/Plan  1- High Grade Bladder Cancer -  We rediscussed operative biopsy / transurethral resection as the best next step for diagnostic and therapeutic purposes with goals being to remove all visible cancer  and obtain tissue for pathologic exam. We rediscussed that for some low-grade tumors, this may be all the treatment required, but that for many other tumors such as high-grade lesions, further therapy including surgery and or chemotherapy may be warranted. We also reoutlined the fact that any bladder cancer diagnosis will require close follow-up with periodic upper and lower tract evaluation. We discussed risks including bleeding, infection, damage to kidney / ureter / bladder including bladder perforation which can typically managed with prolonged foley catheterization. We rementioned anesthetic and other rare risks including DVT, PE, MI, and mortality. I also rementioned that adjunctive procedures such as ureteral stenting, retrograde pyelography, and ureteroscopy may be necessary to fully evaluate the urinary tract depending on intra-operative findings.   After answering all questions to the patient's satisfaction, they wish to proceed today as planned.  Victor Wang 10/29/2014, 6:05 AM

## 2014-10-29 NOTE — Anesthesia Procedure Notes (Signed)
Procedure Name: LMA Insertion Date/Time: 10/29/2014 8:35 AM Performed by: Wanita Chamberlain Pre-anesthesia Checklist: Patient identified, Timeout performed, Emergency Drugs available, Suction available and Patient being monitored Patient Re-evaluated:Patient Re-evaluated prior to inductionOxygen Delivery Method: Circle system utilized Preoxygenation: Pre-oxygenation with 100% oxygen Intubation Type: IV induction LMA: LMA inserted LMA Size: 5.0 Number of attempts: 1 Placement Confirmation: positive ETCO2 Tube secured with: Tape Dental Injury: Teeth and Oropharynx as per pre-operative assessment

## 2014-10-29 NOTE — Op Note (Signed)
NAMEHOLLIS, TULLER NO.:  [id number]  MEDICAL RECORD NO.:  [id number]  LOCATION:                                 FACILITY:  PHYSICIAN:  Alexis Frock, MD     DATE OF BIRTH:  1935/06/08  DATE OF PROCEDURE:  10/29/2014 DATE OF DISCHARGE:  10/29/2014                              OPERATIVE REPORT   DIAGNOSIS:  Recurrent bladder cancer.  PROCEDURE: 1. Transurethral resection of bladder tumor, volume medium. 2. Bilateral retrograde pyelogram, interpretation.  FINDINGS: 1. Multifocal bladder tumor recurrence including 2 foci at the dome     and a foci at the left prostatic urethra, total volume     approximately 3 cm2. 2. Unremarkable bilateral retrograde pyelograms. 3. Very large bladder. 4. Morbid obesity.  SPECIMEN: 1. Bladder dome tumor. 2. Prostatic urethra tumor.  ESTIMATED BLOOD LOSS:  Nil.  DRAINS:  None.  INDICATION:  Mr. Grays is an unhealthy 79 year old gentleman with history of morbid obesity, who has high-grade superficial bladder cancer.  He underwent initial management, transurethral resection x2, and full induction BCG therapy.  Previously, he was found on surveillance cystoscopy to have gross recurrence of disease including bladder dome and prostatic urethra.  Options were discussed for management including repeat endoscopic resection for diagnosis and staging.  He wished to proceed.  Informed consent was obtained and placed in medical record.  PROCEDURE IN DETAIL:  The patient being, Victor Wang, was verified. Procedure being transurethral resection of bladder tumor was confirmed. Procedure was carried out.  Time-out was performed.  Intravenous antibiotics were administered.  General LMA anesthesia was introduced. The patient was placed into a low lithotomy position.  Sterile field was created prepping and draping the patient's penis, perineum, proximal thighs using iodine x3.  Next, cystourethroscopy was performed using  a 22-French rigid cystoscope with 12-degree offset lens.  Inspection of the anterior and posterior urethra revealed a prior TURP defects with generally wide open prostatic urethral channel.  There was some papillary tissue consistent likely urothelial carcinoma and the residual left prostatic lobe at the bladder neck.  Inspection of the urinary bladder revealed very large bladder ureteral orifices were in the normal position.  There were 2 foci of the grossly recurrent bladder tumor at the dome.  Total volume of tumor just may be 3 cm2.  Next, attention was directed at bilateral retrograde pyelograms 1st on the right side.  The right ureteral orifice was cannulated with a 6-French end-hole catheter and right retrograde pyelogram was obtained.  Right retrograde pyelogram demonstrated a single right ureter, single system right kidney.  No filling defects or narrowing noted.  Similarly, left retrograde pyelogram was obtained.  Left retrograde pyelogram demonstrated a single left ureter, single system left kidney.  No filling defects or narrowing noted.  Next, the cystoscope was exchanged for a 26-French A[facility] continuous flow resectoscope sheath and using bipolar resection loop.  Very careful resection was performed of the 2 dome tumors using low cutting current of 120 down what appeared to be the superficial fibromuscular bladder stroma taking exclusive care not to perforate the bladder and this did not occur grossly.  These fragments were set aside, labeled bladder dome tumor.  Purposely deep biopsies were not taken given the dome location and very large bladder as concern for perforation.  Next, resectoscope loop was used to resect the left prostatic urethra area containing the papillary tissue worrisome for tumor recurrence.  These prostate chips were set aside for pathology, labeled prostatic urethra.  Additional coagulation current was applied to the sites.  Following this  maneuver, hemostasis appeared excellent.  There was no evidence of bladder perforation.  It was felt that catheterization was not warranted. Bladder was emptied per cystoscope.  Procedure was then terminated.  The patient tolerated the procedure well.  There were no immediate periprocedural complications.  The patient was taken to postanesthesia care unit in stable condition.          ______________________________ Alexis Frock, MD     TM/MEDQ  D:  10/29/2014  T:  10/29/2014  Job:  [id number]

## 2014-10-29 NOTE — Anesthesia Postprocedure Evaluation (Signed)
  Anesthesia Post-op Note  Patient: Victor Wang  Procedure(s) Performed: Procedure(s) (LRB): TRANSURETHRAL RESECTION OF BLADDER TUMOR WITH GYRUS (TURBT-GYRUS) (N/A) CYSTOSCOPY WITH RETROGRADE PYELOGRAM (Bilateral)  Patient Location: PACU  Anesthesia Type: General  Level of Consciousness: awake and alert   Airway and Oxygen Therapy: Patient Spontanous Breathing  Post-op Pain: mild  Post-op Assessment: Post-op Vital signs reviewed, Patient's Cardiovascular Status Stable, Respiratory Function Stable, Patent Airway and No signs of Nausea or vomiting  Last Vitals:  Filed Vitals:   10/29/14 0922  BP: 146/79  Pulse:   Temp:   Resp:     Post-op Vital Signs: stable   Complications: No apparent anesthesia complications

## 2014-10-29 NOTE — Discharge Instructions (Signed)
1 - You may have urinary urgency (bladder spasms) and bloody urine on / off x few days. This is normal.  2 - Call MD or go to ER for fever >102, severe pain / nausea / vomiting not relieved by medications, or acute change in medical status  CYSTOSCOPY HOME CARE INSTRUCTIONS  Activity: Rest for the remainder of the day.  Do not drive or operate equipment today.  You may resume normal activities in one to two days as instructed by your physician.   Meals: Drink plenty of liquids and eat light foods such as gelatin or soup this evening.  You may return to a normal meal plan tomorrow.  Return to Work: You may return to work in one to two days or as instructed by your physician.  Special Instructions / Symptoms: Call your physician if any of these symptoms occur:   -persistent or heavy bleeding  -bleeding which continues after first few urination  -large blood clots that are difficult to pass  -urine stream diminishes or stops completely  -fever equal to or higher than 101 degrees Farenheit.  -cloudy urine with a strong, foul odor  -severe pain  Females should always wipe from front to back after elimination.  You may feel some burning pain when you urinate.  This should disappear with time.  Applying moist heat to the lower abdomen or a hot tub bath may help relieve the pain. \  Follow-Up / Date of Return Visit to Your Physician:  Call for an appointment to arrange follow-up.  Patient Signature:  ________________________________________________________  Nurse's Signature:  ________________________________________________________  Post Anesthesia Home Care Instructions  Activity: Get plenty of rest for the remainder of the day. A responsible adult should stay with you for 24 hours following the procedure.  For the next 24 hours, DO NOT: -Drive a car -Paediatric nurse -Drink alcoholic beverages -Take any medication unless instructed by your physician -Make any legal decisions  or sign important papers.  Meals: Start with liquid foods such as gelatin or soup. Progress to regular foods as tolerated. Avoid greasy, spicy, heavy foods. If nausea and/or vomiting occur, drink only clear liquids until the nausea and/or vomiting subsides. Call your physician if vomiting continues.  Special Instructions/Symptoms: Your throat may feel dry or sore from the anesthesia or the breathing tube placed in your throat during surgery. If this causes discomfort, gargle with warm salt water. The discomfort should disappear within 24 hours.

## 2014-10-29 NOTE — Transfer of Care (Signed)
Immediate Anesthesia Transfer of Care Note  Patient: Victor Wang  Procedure(s) Performed: Procedure(s): TRANSURETHRAL RESECTION OF BLADDER TUMOR WITH GYRUS (TURBT-GYRUS) (N/A) CYSTOSCOPY WITH RETROGRADE PYELOGRAM (Bilateral)  Patient Location: PACU  Anesthesia Type:General  Level of Consciousness: awake, alert , oriented and patient cooperative  Airway & Oxygen Therapy: Patient Spontanous Breathing and Patient connected to nasal cannula oxygen  Post-op Assessment: Report given to PACU RN and Post -op Vital signs reviewed and stable  Post vital signs: Reviewed and stable  Complications: No apparent anesthesia complications

## 2014-10-30 ENCOUNTER — Encounter (HOSPITAL_BASED_OUTPATIENT_CLINIC_OR_DEPARTMENT_OTHER): Payer: Self-pay | Admitting: Urology

## 2014-11-06 NOTE — Addendum Note (Signed)
Addendum  created 11/06/14 0618 by Milana Obey, MD   Modules edited: Anesthesia Responsible Staff

## 2014-11-08 DIAGNOSIS — R0902 Hypoxemia: Secondary | ICD-10-CM | POA: Diagnosis not present

## 2014-11-08 DIAGNOSIS — B370 Candidal stomatitis: Secondary | ICD-10-CM | POA: Diagnosis not present

## 2014-11-08 DIAGNOSIS — B379 Candidiasis, unspecified: Secondary | ICD-10-CM | POA: Diagnosis not present

## 2014-11-08 DIAGNOSIS — R0781 Pleurodynia: Secondary | ICD-10-CM | POA: Diagnosis not present

## 2014-11-08 DIAGNOSIS — I517 Cardiomegaly: Secondary | ICD-10-CM | POA: Diagnosis not present

## 2014-11-08 DIAGNOSIS — B3789 Other sites of candidiasis: Secondary | ICD-10-CM | POA: Diagnosis not present

## 2014-11-08 DIAGNOSIS — I10 Essential (primary) hypertension: Secondary | ICD-10-CM | POA: Diagnosis not present

## 2014-11-08 DIAGNOSIS — R0602 Shortness of breath: Secondary | ICD-10-CM | POA: Diagnosis not present

## 2014-11-08 DIAGNOSIS — J028 Acute pharyngitis due to other specified organisms: Secondary | ICD-10-CM | POA: Diagnosis not present

## 2014-11-08 DIAGNOSIS — J209 Acute bronchitis, unspecified: Secondary | ICD-10-CM | POA: Diagnosis not present

## 2014-11-08 DIAGNOSIS — J029 Acute pharyngitis, unspecified: Secondary | ICD-10-CM | POA: Diagnosis not present

## 2014-11-08 DIAGNOSIS — J9811 Atelectasis: Secondary | ICD-10-CM | POA: Diagnosis not present

## 2014-11-08 DIAGNOSIS — R0600 Dyspnea, unspecified: Secondary | ICD-10-CM | POA: Diagnosis not present

## 2014-11-10 DIAGNOSIS — R7309 Other abnormal glucose: Secondary | ICD-10-CM | POA: Diagnosis not present

## 2014-11-10 DIAGNOSIS — B370 Candidal stomatitis: Secondary | ICD-10-CM | POA: Diagnosis not present

## 2014-11-10 DIAGNOSIS — R0600 Dyspnea, unspecified: Secondary | ICD-10-CM | POA: Diagnosis not present

## 2014-11-11 DIAGNOSIS — K760 Fatty (change of) liver, not elsewhere classified: Secondary | ICD-10-CM | POA: Diagnosis not present

## 2014-11-11 DIAGNOSIS — R0602 Shortness of breath: Secondary | ICD-10-CM | POA: Diagnosis not present

## 2014-11-11 DIAGNOSIS — K8020 Calculus of gallbladder without cholecystitis without obstruction: Secondary | ICD-10-CM | POA: Diagnosis not present

## 2014-11-11 DIAGNOSIS — C679 Malignant neoplasm of bladder, unspecified: Secondary | ICD-10-CM | POA: Diagnosis not present

## 2014-11-11 DIAGNOSIS — K5790 Diverticulosis of intestine, part unspecified, without perforation or abscess without bleeding: Secondary | ICD-10-CM | POA: Diagnosis not present

## 2014-11-12 DIAGNOSIS — R0600 Dyspnea, unspecified: Secondary | ICD-10-CM | POA: Diagnosis not present

## 2014-11-12 DIAGNOSIS — J441 Chronic obstructive pulmonary disease with (acute) exacerbation: Secondary | ICD-10-CM | POA: Diagnosis not present

## 2014-11-17 DIAGNOSIS — C672 Malignant neoplasm of lateral wall of bladder: Secondary | ICD-10-CM | POA: Diagnosis not present

## 2014-12-16 DIAGNOSIS — K7689 Other specified diseases of liver: Secondary | ICD-10-CM | POA: Diagnosis not present

## 2014-12-16 DIAGNOSIS — K8020 Calculus of gallbladder without cholecystitis without obstruction: Secondary | ICD-10-CM | POA: Diagnosis not present

## 2014-12-16 DIAGNOSIS — N281 Cyst of kidney, acquired: Secondary | ICD-10-CM | POA: Diagnosis not present

## 2014-12-16 DIAGNOSIS — C679 Malignant neoplasm of bladder, unspecified: Secondary | ICD-10-CM | POA: Diagnosis not present

## 2015-01-13 DIAGNOSIS — M810 Age-related osteoporosis without current pathological fracture: Secondary | ICD-10-CM | POA: Diagnosis not present

## 2015-01-13 DIAGNOSIS — N400 Enlarged prostate without lower urinary tract symptoms: Secondary | ICD-10-CM | POA: Diagnosis not present

## 2015-01-13 DIAGNOSIS — C679 Malignant neoplasm of bladder, unspecified: Secondary | ICD-10-CM | POA: Diagnosis not present

## 2015-01-13 DIAGNOSIS — Z1389 Encounter for screening for other disorder: Secondary | ICD-10-CM | POA: Diagnosis not present

## 2015-01-13 DIAGNOSIS — M109 Gout, unspecified: Secondary | ICD-10-CM | POA: Diagnosis not present

## 2015-01-13 DIAGNOSIS — I10 Essential (primary) hypertension: Secondary | ICD-10-CM | POA: Diagnosis not present

## 2015-01-13 DIAGNOSIS — N033 Chronic nephritic syndrome with diffuse mesangial proliferative glomerulonephritis: Secondary | ICD-10-CM | POA: Diagnosis not present

## 2015-01-21 DIAGNOSIS — I10 Essential (primary) hypertension: Secondary | ICD-10-CM | POA: Diagnosis not present

## 2015-01-21 DIAGNOSIS — N400 Enlarged prostate without lower urinary tract symptoms: Secondary | ICD-10-CM | POA: Diagnosis not present

## 2015-01-21 DIAGNOSIS — N033 Chronic nephritic syndrome with diffuse mesangial proliferative glomerulonephritis: Secondary | ICD-10-CM | POA: Diagnosis not present

## 2015-01-21 DIAGNOSIS — C679 Malignant neoplasm of bladder, unspecified: Secondary | ICD-10-CM | POA: Diagnosis not present

## 2015-02-10 DIAGNOSIS — N189 Chronic kidney disease, unspecified: Secondary | ICD-10-CM | POA: Diagnosis not present

## 2015-02-10 DIAGNOSIS — N052 Unspecified nephritic syndrome with diffuse membranous glomerulonephritis: Secondary | ICD-10-CM | POA: Diagnosis not present

## 2015-02-10 DIAGNOSIS — C679 Malignant neoplasm of bladder, unspecified: Secondary | ICD-10-CM | POA: Diagnosis not present

## 2015-02-10 DIAGNOSIS — I129 Hypertensive chronic kidney disease with stage 1 through stage 4 chronic kidney disease, or unspecified chronic kidney disease: Secondary | ICD-10-CM | POA: Diagnosis not present

## 2015-02-10 DIAGNOSIS — R062 Wheezing: Secondary | ICD-10-CM | POA: Diagnosis not present

## 2015-02-11 DIAGNOSIS — K219 Gastro-esophageal reflux disease without esophagitis: Secondary | ICD-10-CM | POA: Diagnosis not present

## 2015-02-11 DIAGNOSIS — K2270 Barrett's esophagus without dysplasia: Secondary | ICD-10-CM | POA: Diagnosis not present

## 2015-02-11 DIAGNOSIS — K5730 Diverticulosis of large intestine without perforation or abscess without bleeding: Secondary | ICD-10-CM | POA: Diagnosis not present

## 2015-02-15 DIAGNOSIS — L0390 Cellulitis, unspecified: Secondary | ICD-10-CM | POA: Diagnosis not present

## 2015-02-16 DIAGNOSIS — N289 Disorder of kidney and ureter, unspecified: Secondary | ICD-10-CM | POA: Diagnosis not present

## 2015-02-16 DIAGNOSIS — C672 Malignant neoplasm of lateral wall of bladder: Secondary | ICD-10-CM | POA: Diagnosis not present

## 2015-02-19 DIAGNOSIS — L03116 Cellulitis of left lower limb: Secondary | ICD-10-CM | POA: Diagnosis not present

## 2015-02-19 DIAGNOSIS — J302 Other seasonal allergic rhinitis: Secondary | ICD-10-CM | POA: Diagnosis not present

## 2015-02-19 DIAGNOSIS — I10 Essential (primary) hypertension: Secondary | ICD-10-CM | POA: Diagnosis not present

## 2015-03-06 DIAGNOSIS — Z8719 Personal history of other diseases of the digestive system: Secondary | ICD-10-CM | POA: Diagnosis not present

## 2015-03-06 DIAGNOSIS — R0902 Hypoxemia: Secondary | ICD-10-CM | POA: Diagnosis not present

## 2015-03-06 DIAGNOSIS — K2270 Barrett's esophagus without dysplasia: Secondary | ICD-10-CM | POA: Diagnosis not present

## 2015-03-06 DIAGNOSIS — K449 Diaphragmatic hernia without obstruction or gangrene: Secondary | ICD-10-CM | POA: Diagnosis not present

## 2015-03-06 DIAGNOSIS — N289 Disorder of kidney and ureter, unspecified: Secondary | ICD-10-CM | POA: Diagnosis not present

## 2015-03-06 DIAGNOSIS — Z86010 Personal history of colonic polyps: Secondary | ICD-10-CM | POA: Diagnosis not present

## 2015-03-06 DIAGNOSIS — K319 Disease of stomach and duodenum, unspecified: Secondary | ICD-10-CM | POA: Diagnosis not present

## 2015-03-06 DIAGNOSIS — K219 Gastro-esophageal reflux disease without esophagitis: Secondary | ICD-10-CM | POA: Diagnosis not present

## 2015-03-06 DIAGNOSIS — K5730 Diverticulosis of large intestine without perforation or abscess without bleeding: Secondary | ICD-10-CM | POA: Diagnosis not present

## 2015-03-06 DIAGNOSIS — Z1211 Encounter for screening for malignant neoplasm of colon: Secondary | ICD-10-CM | POA: Diagnosis not present

## 2015-03-06 DIAGNOSIS — Z87891 Personal history of nicotine dependence: Secondary | ICD-10-CM | POA: Diagnosis not present

## 2015-03-06 DIAGNOSIS — K209 Esophagitis, unspecified: Secondary | ICD-10-CM | POA: Diagnosis not present

## 2015-03-06 DIAGNOSIS — I10 Essential (primary) hypertension: Secondary | ICD-10-CM | POA: Diagnosis not present

## 2015-04-13 DIAGNOSIS — E785 Hyperlipidemia, unspecified: Secondary | ICD-10-CM | POA: Diagnosis not present

## 2015-04-13 DIAGNOSIS — I10 Essential (primary) hypertension: Secondary | ICD-10-CM | POA: Diagnosis not present

## 2015-04-13 DIAGNOSIS — M549 Dorsalgia, unspecified: Secondary | ICD-10-CM | POA: Diagnosis not present

## 2015-04-17 DIAGNOSIS — S39012D Strain of muscle, fascia and tendon of lower back, subsequent encounter: Secondary | ICD-10-CM | POA: Diagnosis not present

## 2015-04-17 DIAGNOSIS — M545 Low back pain: Secondary | ICD-10-CM | POA: Diagnosis not present

## 2015-04-17 DIAGNOSIS — M6281 Muscle weakness (generalized): Secondary | ICD-10-CM | POA: Diagnosis not present

## 2015-04-21 DIAGNOSIS — M545 Low back pain: Secondary | ICD-10-CM | POA: Diagnosis not present

## 2015-04-21 DIAGNOSIS — M6281 Muscle weakness (generalized): Secondary | ICD-10-CM | POA: Diagnosis not present

## 2015-04-21 DIAGNOSIS — S39012D Strain of muscle, fascia and tendon of lower back, subsequent encounter: Secondary | ICD-10-CM | POA: Diagnosis not present

## 2015-04-24 DIAGNOSIS — M6281 Muscle weakness (generalized): Secondary | ICD-10-CM | POA: Diagnosis not present

## 2015-04-24 DIAGNOSIS — M545 Low back pain: Secondary | ICD-10-CM | POA: Diagnosis not present

## 2015-04-24 DIAGNOSIS — S39012D Strain of muscle, fascia and tendon of lower back, subsequent encounter: Secondary | ICD-10-CM | POA: Diagnosis not present

## 2015-04-28 DIAGNOSIS — M6281 Muscle weakness (generalized): Secondary | ICD-10-CM | POA: Diagnosis not present

## 2015-04-28 DIAGNOSIS — S39012D Strain of muscle, fascia and tendon of lower back, subsequent encounter: Secondary | ICD-10-CM | POA: Diagnosis not present

## 2015-04-28 DIAGNOSIS — M545 Low back pain: Secondary | ICD-10-CM | POA: Diagnosis not present

## 2015-05-01 DIAGNOSIS — M545 Low back pain: Secondary | ICD-10-CM | POA: Diagnosis not present

## 2015-05-01 DIAGNOSIS — S39012D Strain of muscle, fascia and tendon of lower back, subsequent encounter: Secondary | ICD-10-CM | POA: Diagnosis not present

## 2015-05-01 DIAGNOSIS — M6281 Muscle weakness (generalized): Secondary | ICD-10-CM | POA: Diagnosis not present

## 2015-05-05 DIAGNOSIS — M6281 Muscle weakness (generalized): Secondary | ICD-10-CM | POA: Diagnosis not present

## 2015-05-05 DIAGNOSIS — M545 Low back pain: Secondary | ICD-10-CM | POA: Diagnosis not present

## 2015-05-05 DIAGNOSIS — S39012D Strain of muscle, fascia and tendon of lower back, subsequent encounter: Secondary | ICD-10-CM | POA: Diagnosis not present

## 2015-05-07 DIAGNOSIS — M545 Low back pain: Secondary | ICD-10-CM | POA: Diagnosis not present

## 2015-05-07 DIAGNOSIS — S39012D Strain of muscle, fascia and tendon of lower back, subsequent encounter: Secondary | ICD-10-CM | POA: Diagnosis not present

## 2015-05-07 DIAGNOSIS — M6281 Muscle weakness (generalized): Secondary | ICD-10-CM | POA: Diagnosis not present

## 2015-05-12 DIAGNOSIS — M6281 Muscle weakness (generalized): Secondary | ICD-10-CM | POA: Diagnosis not present

## 2015-05-12 DIAGNOSIS — M545 Low back pain: Secondary | ICD-10-CM | POA: Diagnosis not present

## 2015-05-12 DIAGNOSIS — S39012D Strain of muscle, fascia and tendon of lower back, subsequent encounter: Secondary | ICD-10-CM | POA: Diagnosis not present

## 2015-05-14 DIAGNOSIS — S39012D Strain of muscle, fascia and tendon of lower back, subsequent encounter: Secondary | ICD-10-CM | POA: Diagnosis not present

## 2015-05-14 DIAGNOSIS — M6281 Muscle weakness (generalized): Secondary | ICD-10-CM | POA: Diagnosis not present

## 2015-05-14 DIAGNOSIS — M545 Low back pain: Secondary | ICD-10-CM | POA: Diagnosis not present

## 2015-05-15 DIAGNOSIS — R609 Edema, unspecified: Secondary | ICD-10-CM | POA: Diagnosis not present

## 2015-05-18 DIAGNOSIS — L03115 Cellulitis of right lower limb: Secondary | ICD-10-CM | POA: Diagnosis not present

## 2015-05-18 DIAGNOSIS — R609 Edema, unspecified: Secondary | ICD-10-CM | POA: Diagnosis not present

## 2015-05-18 DIAGNOSIS — M545 Low back pain: Secondary | ICD-10-CM | POA: Diagnosis not present

## 2015-05-18 DIAGNOSIS — M6281 Muscle weakness (generalized): Secondary | ICD-10-CM | POA: Diagnosis not present

## 2015-05-18 DIAGNOSIS — S39012D Strain of muscle, fascia and tendon of lower back, subsequent encounter: Secondary | ICD-10-CM | POA: Diagnosis not present

## 2015-05-25 DIAGNOSIS — N289 Disorder of kidney and ureter, unspecified: Secondary | ICD-10-CM | POA: Diagnosis not present

## 2015-05-25 DIAGNOSIS — C672 Malignant neoplasm of lateral wall of bladder: Secondary | ICD-10-CM | POA: Diagnosis not present

## 2015-05-26 DIAGNOSIS — M6281 Muscle weakness (generalized): Secondary | ICD-10-CM | POA: Diagnosis not present

## 2015-05-26 DIAGNOSIS — M545 Low back pain: Secondary | ICD-10-CM | POA: Diagnosis not present

## 2015-05-26 DIAGNOSIS — S39012D Strain of muscle, fascia and tendon of lower back, subsequent encounter: Secondary | ICD-10-CM | POA: Diagnosis not present

## 2015-05-28 DIAGNOSIS — S39012D Strain of muscle, fascia and tendon of lower back, subsequent encounter: Secondary | ICD-10-CM | POA: Diagnosis not present

## 2015-05-28 DIAGNOSIS — M6281 Muscle weakness (generalized): Secondary | ICD-10-CM | POA: Diagnosis not present

## 2015-05-28 DIAGNOSIS — M545 Low back pain: Secondary | ICD-10-CM | POA: Diagnosis not present

## 2015-06-02 DIAGNOSIS — M6281 Muscle weakness (generalized): Secondary | ICD-10-CM | POA: Diagnosis not present

## 2015-06-02 DIAGNOSIS — M545 Low back pain: Secondary | ICD-10-CM | POA: Diagnosis not present

## 2015-06-02 DIAGNOSIS — S39012D Strain of muscle, fascia and tendon of lower back, subsequent encounter: Secondary | ICD-10-CM | POA: Diagnosis not present

## 2015-06-09 DIAGNOSIS — M6281 Muscle weakness (generalized): Secondary | ICD-10-CM | POA: Diagnosis not present

## 2015-06-09 DIAGNOSIS — S39012D Strain of muscle, fascia and tendon of lower back, subsequent encounter: Secondary | ICD-10-CM | POA: Diagnosis not present

## 2015-06-09 DIAGNOSIS — M545 Low back pain: Secondary | ICD-10-CM | POA: Diagnosis not present

## 2015-07-20 DIAGNOSIS — E785 Hyperlipidemia, unspecified: Secondary | ICD-10-CM | POA: Diagnosis not present

## 2015-07-20 DIAGNOSIS — I10 Essential (primary) hypertension: Secondary | ICD-10-CM | POA: Diagnosis not present

## 2015-08-24 DIAGNOSIS — C672 Malignant neoplasm of lateral wall of bladder: Secondary | ICD-10-CM | POA: Diagnosis not present

## 2015-08-24 DIAGNOSIS — N289 Disorder of kidney and ureter, unspecified: Secondary | ICD-10-CM | POA: Diagnosis not present

## 2015-08-25 ENCOUNTER — Other Ambulatory Visit: Payer: Self-pay | Admitting: Urology

## 2015-09-07 ENCOUNTER — Encounter (HOSPITAL_BASED_OUTPATIENT_CLINIC_OR_DEPARTMENT_OTHER): Payer: Self-pay | Admitting: *Deleted

## 2015-09-07 NOTE — Progress Notes (Signed)
   09/07/15 1505  OBSTRUCTIVE SLEEP APNEA  Have you ever been diagnosed with sleep apnea through a sleep study? No  Do you snore loudly (loud enough to be heard through closed doors)?  1  Do you often feel tired, fatigued, or sleepy during the daytime (such as falling asleep during driving or talking to someone)? 0  Has anyone observed you stop breathing during your sleep? 1  Do you have, or are you being treated for high blood pressure? 1  BMI more than 35 kg/m2? 1  Age > 50 (1-yes) 1  Neck circumference greater than:Male 16 inches or larger, Male 17inches or larger? 1  Male Gender (Yes=1) 1  Obstructive Sleep Apnea Score 7  Score 5 or greater  Results sent to PCP

## 2015-09-07 NOTE — Progress Notes (Signed)
NPO AFTER MN WITH EXCEPTION CLEAR LIQUIDS UNTIL 0730 (NO CREAM/ MILK PRODUCTS).  ARRIVE AT 1215.  NEEDS ISTAT AND EKG. WILL TAKE AM MEDS W/ EXCEPTION NO LASIX DOS W/ SIPS OF WATER.

## 2015-09-10 ENCOUNTER — Ambulatory Visit (HOSPITAL_BASED_OUTPATIENT_CLINIC_OR_DEPARTMENT_OTHER): Payer: Medicare Other | Admitting: Anesthesiology

## 2015-09-10 ENCOUNTER — Encounter (HOSPITAL_BASED_OUTPATIENT_CLINIC_OR_DEPARTMENT_OTHER): Payer: Self-pay

## 2015-09-10 ENCOUNTER — Encounter (HOSPITAL_COMMUNITY)
Admission: RE | Disposition: A | Discharge status: Home / Self Care | Payer: Self-pay | Source: Ambulatory Visit | Attending: Urology

## 2015-09-10 ENCOUNTER — Other Ambulatory Visit: Payer: Self-pay

## 2015-09-10 ENCOUNTER — Observation Stay (HOSPITAL_BASED_OUTPATIENT_CLINIC_OR_DEPARTMENT_OTHER)
Admission: RE | Admit: 2015-09-10 | Discharge: 2015-09-11 | Disposition: A | Discharge status: Home / Self Care | Payer: Medicare Other | Source: Ambulatory Visit | Attending: Urology | Admitting: Urology

## 2015-09-10 DIAGNOSIS — M1990 Unspecified osteoarthritis, unspecified site: Secondary | ICD-10-CM | POA: Diagnosis not present

## 2015-09-10 DIAGNOSIS — Z79899 Other long term (current) drug therapy: Secondary | ICD-10-CM | POA: Insufficient documentation

## 2015-09-10 DIAGNOSIS — N400 Enlarged prostate without lower urinary tract symptoms: Secondary | ICD-10-CM | POA: Diagnosis not present

## 2015-09-10 DIAGNOSIS — Z9079 Acquired absence of other genital organ(s): Secondary | ICD-10-CM | POA: Diagnosis not present

## 2015-09-10 DIAGNOSIS — J449 Chronic obstructive pulmonary disease, unspecified: Secondary | ICD-10-CM | POA: Diagnosis not present

## 2015-09-10 DIAGNOSIS — Z6841 Body Mass Index (BMI) 40.0 and over, adult: Secondary | ICD-10-CM | POA: Insufficient documentation

## 2015-09-10 DIAGNOSIS — E662 Morbid (severe) obesity with alveolar hypoventilation: Secondary | ICD-10-CM | POA: Insufficient documentation

## 2015-09-10 DIAGNOSIS — C679 Malignant neoplasm of bladder, unspecified: Secondary | ICD-10-CM | POA: Diagnosis not present

## 2015-09-10 DIAGNOSIS — K219 Gastro-esophageal reflux disease without esophagitis: Secondary | ICD-10-CM | POA: Diagnosis not present

## 2015-09-10 DIAGNOSIS — I129 Hypertensive chronic kidney disease with stage 1 through stage 4 chronic kidney disease, or unspecified chronic kidney disease: Secondary | ICD-10-CM | POA: Insufficient documentation

## 2015-09-10 DIAGNOSIS — N189 Chronic kidney disease, unspecified: Secondary | ICD-10-CM | POA: Insufficient documentation

## 2015-09-10 DIAGNOSIS — C672 Malignant neoplasm of lateral wall of bladder: Secondary | ICD-10-CM | POA: Diagnosis not present

## 2015-09-10 DIAGNOSIS — N401 Enlarged prostate with lower urinary tract symptoms: Secondary | ICD-10-CM | POA: Diagnosis not present

## 2015-09-10 DIAGNOSIS — Z880 Allergy status to penicillin: Secondary | ICD-10-CM | POA: Diagnosis not present

## 2015-09-10 DIAGNOSIS — Z91030 Bee allergy status: Secondary | ICD-10-CM | POA: Diagnosis not present

## 2015-09-10 DIAGNOSIS — E785 Hyperlipidemia, unspecified: Secondary | ICD-10-CM | POA: Diagnosis not present

## 2015-09-10 DIAGNOSIS — Z87891 Personal history of nicotine dependence: Secondary | ICD-10-CM | POA: Diagnosis not present

## 2015-09-10 DIAGNOSIS — N138 Other obstructive and reflux uropathy: Secondary | ICD-10-CM | POA: Diagnosis not present

## 2015-09-10 DIAGNOSIS — C61 Malignant neoplasm of prostate: Secondary | ICD-10-CM | POA: Insufficient documentation

## 2015-09-10 DIAGNOSIS — I10 Essential (primary) hypertension: Secondary | ICD-10-CM | POA: Diagnosis not present

## 2015-09-10 HISTORY — PX: CYSTOSCOPY W/ RETROGRADES: SHX1426

## 2015-09-10 HISTORY — PX: TRANSURETHRAL RESECTION OF BLADDER TUMOR WITH GYRUS (TURBT-GYRUS): SHX6458

## 2015-09-10 HISTORY — PX: TRANSURETHRAL RESECTION OF PROSTATE: SHX73

## 2015-09-10 LAB — POCT I-STAT 4, (NA,K, GLUC, HGB,HCT)
Glucose, Bld: 123 mg/dL — ABNORMAL HIGH (ref 65–99)
HCT: 37 % — ABNORMAL LOW (ref 39.0–52.0)
Hemoglobin: 12.6 g/dL — ABNORMAL LOW (ref 13.0–17.0)
POTASSIUM: 4.5 mmol/L (ref 3.5–5.1)
SODIUM: 132 mmol/L — AB (ref 135–145)

## 2015-09-10 SURGERY — TRANSURETHRAL RESECTION OF THE PROSTATE WITH GYRUS INSTRUMENTS
Anesthesia: General | Site: Bladder

## 2015-09-10 MED ORDER — HYDRALAZINE HCL 25 MG PO TABS
25.0000 mg | ORAL_TABLET | Freq: Two times a day (BID) | ORAL | Status: DC
  Administered 2015-09-10 – 2015-09-11 (×2): 25 mg via ORAL
  Filled 2015-09-10 (×2): qty 1

## 2015-09-10 MED ORDER — FUROSEMIDE 20 MG PO TABS: 20.0000 mg | ORAL_TABLET | Freq: Every morning | ORAL | Status: DC

## 2015-09-10 MED ORDER — SODIUM CHLORIDE 0.9 % IR SOLN
Status: DC | PRN
  Administered 2015-09-10 (×4): 3000 mL via INTRAVESICAL

## 2015-09-10 MED ORDER — CEFTRIAXONE SODIUM 1 G IJ SOLR
INTRAMUSCULAR | Status: AC
  Filled 2015-09-10: qty 10

## 2015-09-10 MED ORDER — ONDANSETRON HCL 4 MG/2ML IJ SOLN: 4.0000 mg | INTRAMUSCULAR | Status: DC | PRN

## 2015-09-10 MED ORDER — PROPOFOL 10 MG/ML IV BOLUS
INTRAVENOUS | Status: DC | PRN
Start: 2015-09-10 — End: 2015-09-10
  Administered 2015-09-10: 200 mg via INTRAVENOUS

## 2015-09-10 MED ORDER — OXYCODONE HCL 5 MG PO TABS: 5.0000 mg | ORAL_TABLET | ORAL | Status: DC | PRN

## 2015-09-10 MED ORDER — ALLOPURINOL 100 MG PO TABS
100.0000 mg | ORAL_TABLET | Freq: Every morning | ORAL | Status: DC
  Administered 2015-09-11: 100 mg via ORAL
  Filled 2015-09-10: qty 1

## 2015-09-10 MED ORDER — ACETAMINOPHEN 10 MG/ML IV SOLN
INTRAVENOUS | Status: DC | PRN
  Administered 2015-09-10: 1000 mg via INTRAVENOUS

## 2015-09-10 MED ORDER — ACETAMINOPHEN 500 MG PO TABS
1000.0000 mg | ORAL_TABLET | Freq: Three times a day (TID) | ORAL | Status: AC
  Administered 2015-09-10 – 2015-09-11 (×3): 1000 mg via ORAL
  Filled 2015-09-10 (×3): qty 2

## 2015-09-10 MED ORDER — DEXAMETHASONE SODIUM PHOSPHATE 4 MG/ML IJ SOLN
INTRAMUSCULAR | Status: DC | PRN
Start: 2015-09-10 — End: 2015-09-10
  Administered 2015-09-10: 10 mg via INTRAVENOUS

## 2015-09-10 MED ORDER — PRAVASTATIN SODIUM 40 MG PO TABS
80.0000 mg | ORAL_TABLET | Freq: Every morning | ORAL | Status: DC
  Administered 2015-09-11: 80 mg via ORAL
  Filled 2015-09-10: qty 2

## 2015-09-10 MED ORDER — PANTOPRAZOLE SODIUM 40 MG PO TBEC
40.0000 mg | DELAYED_RELEASE_TABLET | Freq: Every day | ORAL | Status: DC
  Administered 2015-09-11: 40 mg via ORAL
  Filled 2015-09-10: qty 1

## 2015-09-10 MED ORDER — HYDROMORPHONE HCL 1 MG/ML IJ SOLN: 0.5000 mg | INTRAMUSCULAR | Status: DC | PRN

## 2015-09-10 MED ORDER — METOPROLOL TARTRATE 25 MG PO TABS
25.0000 mg | ORAL_TABLET | Freq: Every morning | ORAL | Status: DC
  Administered 2015-09-11: 25 mg via ORAL
  Filled 2015-09-10: qty 1

## 2015-09-10 MED ORDER — IOHEXOL 350 MG/ML SOLN
INTRAVENOUS | Status: DC | PRN
  Administered 2015-09-10: 30 mL

## 2015-09-10 MED ORDER — FENTANYL CITRATE (PF) 100 MCG/2ML IJ SOLN
25.0000 ug | INTRAMUSCULAR | Status: DC | PRN
  Filled 2015-09-10: qty 1

## 2015-09-10 MED ORDER — FENTANYL CITRATE (PF) 100 MCG/2ML IJ SOLN
INTRAMUSCULAR | Status: DC | PRN
  Administered 2015-09-10 (×8): 25 ug via INTRAVENOUS

## 2015-09-10 MED ORDER — ONDANSETRON HCL 4 MG/2ML IJ SOLN
INTRAMUSCULAR | Status: DC | PRN
  Administered 2015-09-10: 4 mg via INTRAVENOUS

## 2015-09-10 MED ORDER — ONDANSETRON HCL 4 MG/2ML IJ SOLN
4.0000 mg | Freq: Once | INTRAMUSCULAR | Status: DC | PRN
  Filled 2015-09-10: qty 2

## 2015-09-10 MED ORDER — LIDOCAINE HCL (CARDIAC) 20 MG/ML IV SOLN
INTRAVENOUS | Status: DC | PRN
  Administered 2015-09-10: 80 mg via INTRAVENOUS

## 2015-09-10 MED ORDER — KCL IN DEXTROSE-NACL 20-5-0.45 MEQ/L-%-% IV SOLN
INTRAVENOUS | Status: DC
  Administered 2015-09-10: 1 mL via INTRAVENOUS
  Filled 2015-09-10 (×2): qty 1000

## 2015-09-10 MED ORDER — LACTATED RINGERS IV SOLN
INTRAVENOUS | Status: DC
  Administered 2015-09-10: 13:00:00 via INTRAVENOUS
  Filled 2015-09-10: qty 1000

## 2015-09-10 MED ORDER — LOSARTAN POTASSIUM 50 MG PO TABS
100.0000 mg | ORAL_TABLET | Freq: Every morning | ORAL | Status: DC
  Administered 2015-09-11: 100 mg via ORAL
  Filled 2015-09-10: qty 2

## 2015-09-10 MED ORDER — SENNOSIDES-DOCUSATE SODIUM 8.6-50 MG PO TABS
1.0000 | ORAL_TABLET | Freq: Two times a day (BID) | ORAL | Status: DC
Start: 2015-09-10 — End: 2015-09-11
  Administered 2015-09-10 – 2015-09-11 (×2): 1 via ORAL
  Filled 2015-09-10 (×2): qty 1

## 2015-09-10 MED ORDER — FUROSEMIDE 40 MG PO TABS
60.0000 mg | ORAL_TABLET | Freq: Every morning | ORAL | Status: DC
  Administered 2015-09-11: 60 mg via ORAL
  Filled 2015-09-10 (×2): qty 1

## 2015-09-10 MED ORDER — CEFTRIAXONE SODIUM 1 G IJ SOLR
1.0000 g | INTRAMUSCULAR | Status: AC
  Administered 2015-09-10: 1 g via INTRAVENOUS
  Filled 2015-09-10: qty 10

## 2015-09-10 MED ORDER — FENTANYL CITRATE (PF) 100 MCG/2ML IJ SOLN
INTRAMUSCULAR | Status: AC
  Filled 2015-09-10: qty 6

## 2015-09-10 MED ORDER — DEXTROSE 5 % IV SOLN
INTRAVENOUS | Status: AC
  Filled 2015-09-10: qty 50

## 2015-09-10 SURGICAL SUPPLY — 34 items
BAG DRN ANRFLXCHMBR STRAP LEK (BAG)
BAG URINE DRAINAGE (UROLOGICAL SUPPLIES) ×1 IMPLANT
BAG URINE LEG 19OZ MD ST LTX (BAG) IMPLANT
BAG URINE LEG 500ML (DRAIN) IMPLANT
BAG URO CATCHER STRL LF (DRAPE) ×3 IMPLANT
BASKET ZERO TIP NITINOL 2.4FR (BASKET) IMPLANT
BSKT STON RTRVL ZERO TP 2.4FR (BASKET)
CATH FOLEY 2WAY SLVR  5CC 22FR (CATHETERS)
CATH FOLEY 2WAY SLVR 30CC 20FR (CATHETERS) IMPLANT
CATH FOLEY 2WAY SLVR 5CC 22FR (CATHETERS) IMPLANT
CATH HEMA 3WAY 30CC 24FR COUDE (CATHETERS) ×1 IMPLANT
CATH INTERMIT  6FR 70CM (CATHETERS) ×1 IMPLANT
CLOTH BEACON ORANGE TIMEOUT ST (SAFETY) ×3 IMPLANT
ELECT BIVAP BIPO 22/24 DONUT (ELECTROSURGICAL)
ELECT REM PT RETURN 9FT ADLT (ELECTROSURGICAL)
ELECTRD BIVAP BIPO 22/24 DONUT (ELECTROSURGICAL) IMPLANT
ELECTRODE REM PT RTRN 9FT ADLT (ELECTROSURGICAL) ×2 IMPLANT
EVACUATOR MICROVAS BLADDER (UROLOGICAL SUPPLIES) IMPLANT
GLOVE BIO SURGEON STRL SZ7.5 (GLOVE) ×3 IMPLANT
GOWN STRL REUS W/ TWL LRG LVL3 (GOWN DISPOSABLE) ×4 IMPLANT
GOWN STRL REUS W/ TWL XL LVL3 (GOWN DISPOSABLE) ×2 IMPLANT
GOWN STRL REUS W/TWL LRG LVL3 (GOWN DISPOSABLE) ×3
GOWN STRL REUS W/TWL XL LVL3 (GOWN DISPOSABLE) ×3
GUIDEWIRE ANG ZIPWIRE 038X150 (WIRE) IMPLANT
GUIDEWIRE STR DUAL SENSOR (WIRE) ×2 IMPLANT
HOLDER FOLEY CATH W/STRAP (MISCELLANEOUS) ×1 IMPLANT
IV NS IRRIG 3000ML ARTHROMATIC (IV SOLUTION) ×6 IMPLANT
KIT ROOM TURNOVER WOR (KITS) ×3 IMPLANT
LOOP CUT BIPOLAR 24F LRG (ELECTROSURGICAL) ×1 IMPLANT
MANIFOLD NEPTUNE II (INSTRUMENTS) ×1 IMPLANT
PACK CYSTO (CUSTOM PROCEDURE TRAY) ×3 IMPLANT
SET ASPIRATION TUBING (TUBING) IMPLANT
SYRINGE IRR TOOMEY STRL 70CC (SYRINGE) IMPLANT
TUBING SUCTION 1/4X6FT (MISCELLANEOUS) IMPLANT

## 2015-09-10 NOTE — Anesthesia Procedure Notes (Addendum)
Procedure Name: LMA Insertion Date/Time: 09/10/2015 1:38 PM Performed by: Justice Rocher Pre-anesthesia Checklist: Patient identified, Emergency Drugs available, Suction available and Patient being monitored Patient Re-evaluated:Patient Re-evaluated prior to inductionOxygen Delivery Method: Circle System Utilized Preoxygenation: Pre-oxygenation with 100% oxygen Intubation Type: IV induction Ventilation: Mask ventilation without difficulty LMA: LMA inserted LMA Size: 5.0 Number of attempts: 1 Airway Equipment and Method: bite block Placement Confirmation: positive ETCO2 Tube secured with: Tape Dental Injury: Teeth and Oropharynx as per pre-operative assessment  Comments: Pt poistioned on gray wedge shoulder support with yellow gel headrest and blankets for optimal positioning for airway management - Pre02

## 2015-09-10 NOTE — Transfer of Care (Signed)
Immediate Anesthesia Transfer of Care Note  Patient: ROCKNE DEARINGER  Procedure(s) Performed: Procedure(s) (LRB): TRANSURETHRAL RESECTION OF THE PROSTATE  (N/A) TRANSURETHRAL RESECTION OF BLADDER TUMOR  (N/A) CYSTOSCOPY WITH RETROGRADE PYELOGRAM (Bilateral)  Patient Location: PACU  Anesthesia Type: General  Level of Consciousness: awake, sedated, patient cooperative and responds to stimulation  Airway & Oxygen Therapy: Patient Spontanous Breathing and Patient connected to face mask oxygen  Post-op Assessment: Report given to PACU RN, Post -op Vital signs reviewed and stable and Patient moving all extremities  Post vital signs: Reviewed and stable  Complications: No apparent anesthesia complications

## 2015-09-10 NOTE — Brief Op Note (Signed)
09/10/2015  2:29 PM  PATIENT:  Victor Wang  80 y.o. male  PRE-OPERATIVE DIAGNOSIS:  RECURRENT BLADDER CANCER, ENLARGED PROSTATE  POST-OPERATIVE DIAGNOSIS:  RECURRENT BLADDER CANCER, ENLARGED   PROCEDURE:  Procedure(s): TRANSURETHRAL RESECTION OF THE PROSTATE  (N/A) TRANSURETHRAL RESECTION OF BLADDER TUMOR  (N/A) CYSTOSCOPY WITH RETROGRADE PYELOGRAM (Bilateral)  SURGEON:  Surgeon(s) and Role:    * Alexis Frock, MD - Primary  PHYSICIAN ASSISTANT:   ASSISTANTS: none   ANESTHESIA:   general  EBL:  Total I/O In: 100 [I.V.:100] Out: -   BLOOD ADMINISTERED:none  DRAINS: 62F 3 way hematuria coude catheter to NS irrigation   LOCAL MEDICATIONS USED:  NONE  SPECIMEN:  Source of Specimen:  1 - Prostate Chips, 2 - Bladder neck tumor and additional prostate chips  DISPOSITION OF SPECIMEN:  PATHOLOGY  COUNTS:  YES  TOURNIQUET:  * No tourniquets in log *  DICTATION: .Other Dictation: Dictation Number [id number]  PLAN OF CARE: Admit for overnight observation  PATIENT DISPOSITION:  PACU - hemodynamically stable.   Delay start of Pharmacological VTE agent (>24hrs) due to surgical blood loss or risk of bleeding: yes

## 2015-09-10 NOTE — H&P (Signed)
Victor Wang is an 80 y.o. male.    Chief Complaint: Pre-OP Transurethral Resection of Bladder Tumor  HPI:    1- High Grade Bladder Cancer - Found initially 2015 on eval gross hematuria. Former smoker and cytoxan use.  Recent History: 03/2014 TaG3, CT clinically localized --> ReTURBT 04/2014 NED --> Induction BCG x6 09/2014 Surv cysto with 3 small papillary lesions <1cm each dome and lateral walls --> TaG3 prostatic urethra / bladder neck 01/2015 Cysto NED; 04/2015 Cysto NED; 07/2015 Cysto recurrene at 12 O'clock bladder neck prostatic fossa.  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 Most recent Cr 0.65.  4 - Prostatic Hyperplasia - s/p TURP previously years ago, no bothersome obstructive symptoms presently.  PMH sig for obesity, HTN, Appy, Mild COPD/obesity hypoventilation (O2 at times). Denis CVA or ischemic heart disease. His PCP is Dr Ula Lingo  Today " Victor Wang " is seen to proceed with TURBT for his recurrent bladder cancer.   Past Medical History  Diagnosis Date  . Hypertension   . Hyperlipidemia   . GERD (gastroesophageal reflux disease)   . BPH (benign prostatic hypertrophy)   . Arthritis   . History of compression fracture of spine     04/2013   T12  . Full dentures   . At risk for sleep apnea     STOP-BANG= 7     SENT TO PCP 09-07-2015  . Bladder cancer [facility])     RECURRENT    Past Surgical History  Procedure Laterality Date  . Tonsillectomy and adenoidectomy  CHILD  . Appendectomy  1985  . Knee ligament reconstruction Right 1970  . Right shoulder surgery  2011  . Transurethral resection of prostate  2006    AND    POST CAURTERIZATION OF BLEEDERS  . Carpal tunnel release Right 02-05-2008  . Transurethral resection of bladder tumor with gyrus (turbt-gyrus) Bilateral 04/09/2014    Procedure: TRANSURETHRAL RESECTION OF BLADDER TUMOR WITH GYRUS (TURBT-GYRUS);  Surgeon:  Sharyn Creamer, MD;  Location: [facility];  Service: Urology;  Laterality: Bilateral;  . Cystoscopy with retrograde pyelogram, ureteroscopy and stent placement Bilateral 04/09/2014    Procedure: CYSTOSCOPY WITH RETROGRADE PYELOGRAM, POSSIBLE URETEROSCOPY WITH BIOPSY AND STENT PLACEMENT;  Surgeon: Sharyn Creamer, MD;  Location: [facility];  Service: Urology;  Laterality: Bilateral;  . Cystoscopy with biopsy N/A 05/26/2014    Procedure: CYSTOSCOPY WITH BIOPSY;  Surgeon: Sharyn Creamer, MD;  Location: [facility];  Service: Urology;  Laterality: N/A;  . Transurethral resection of bladder tumor with gyrus (turbt-gyrus) N/A 10/29/2014    Procedure: TRANSURETHRAL RESECTION OF BLADDER TUMOR WITH GYRUS (TURBT-GYRUS);  Surgeon: Alexis Frock, MD;  Location: [facility];  Service: Urology;  Laterality: N/A;  . Cystoscopy w/ retrogrades Bilateral 10/29/2014    Procedure: CYSTOSCOPY WITH RETROGRADE PYELOGRAM;  Surgeon: Alexis Frock, MD;  Location: [facility];  Service: Urology;  Laterality: Bilateral;    History reviewed. No pertinent family history. Social History:  reports that he has quit smoking. His smoking use included Cigarettes. He has a 52.5 pack-year smoking history. He has never used smokeless tobacco. He reports that he drinks about 3.2 oz of alcohol per week. He reports that he does not use illicit drugs.  Allergies:  Allergies  Allergen Reactions  . Bee Venom Anaphylaxis  . Penicillins Hives and Swelling  No prescriptions prior to admission    No results found for this or any previous visit (from the past 48 hour(s)). No results found.  Review of Systems  Constitutional: Negative.   HENT: Negative.   Eyes: Negative.   Respiratory: Negative.   Cardiovascular: Negative.   Gastrointestinal: Negative.   Genitourinary: Negative.  Negative for flank pain.  Musculoskeletal: Negative.   Skin:  Negative.   Neurological: Negative.   Endo/Heme/Allergies: Negative.   Psychiatric/Behavioral: Negative.     Height '6\' 1"'$  (1.854 m), weight 158.759 kg (350 lb). Physical Exam  Constitutional: He appears well-developed.  HENT:  Head: Normocephalic.  Eyes: Pupils are equal, round, and reactive to light.  Neck: Normal range of motion.  Cardiovascular: Normal rate.   Respiratory: Effort normal.  GI: Soft.  Genitourinary:  No CVAT  Musculoskeletal: Normal range of motion.  Neurological: He is alert.  Skin: Skin is warm.  Psychiatric: He has a normal mood and affect. His behavior is normal. Judgment and thought content normal.     Assessment/Plan  1- High Grade Bladder Cancer -  Likely early recurrence 12 O-oclock bladder neck. Rec TURBT / TURP as he is likely having intermitant bleeding from this.  Will plan on overnight obs admission and home with foley as this will require some substntial prostate resection.   Risks, benefits, alternatives reinforced as well as expected peri-op course.   2 - Prostate Screening - up to date, no indication for further screening at his age.   3 - Chronic Renal Insufficiency - Most recent GFR excellent, observe.  4 - Prostatic Hyperplasia - excellent anatomic and funcitonal outcome s/p prior TURP, observe.   Victor Wang 09/10/2015, 6:00 AM

## 2015-09-10 NOTE — Anesthesia Preprocedure Evaluation (Signed)
Anesthesia Evaluation  Patient identified by MRN, date of birth, ID band Patient awake    Reviewed: Allergy & Precautions, H&P , NPO status , Patient's Chart, lab work & pertinent test results  History of Anesthesia Complications Negative for: history of anesthetic complications  Airway Mallampati: II  TM Distance: >3 FB Neck ROM: Full    Dental no notable dental hx. (+) Edentulous Upper, Edentulous Lower   Pulmonary former smoker,  Positive cough   Pulmonary exam normal breath sounds clear to auscultation       Cardiovascular hypertension, Pt. on medications and Pt. on home beta blockers Normal cardiovascular exam Rhythm:Regular Rate:Normal     Neuro/Psych negative neurological ROS  negative psych ROS   GI/Hepatic Neg liver ROS, GERD  Medicated and Controlled,  Endo/Other  Morbid obesity  Renal/GU negative Renal ROS Bladder dysfunction      Musculoskeletal  (+) Arthritis , Osteoarthritis,    Abdominal (+) + obese,   Peds negative pediatric ROS (+)  Hematology negative hematology ROS (+)   Anesthesia Other Findings   Reproductive/Obstetrics negative OB ROS                             Anesthesia Physical  Anesthesia Plan  ASA: III  Anesthesia Plan: General   Post-op Pain Management:    Induction: Intravenous  Airway Management Planned: LMA  Additional Equipment:   Intra-op Plan:   Post-operative Plan: Extubation in OR  Informed Consent:   Dental advisory given  Plan Discussed with: CRNA and Anesthesiologist  Anesthesia Plan Comments:         Anesthesia Quick Evaluation

## 2015-09-10 NOTE — Discharge Instructions (Signed)
1 - You may have urinary urgency (bladder spasms) and bloody urine on / off with catheter in place. This is normal. ° °2 - Call MD or go to ER for fever >102, severe pain / nausea / vomiting not relieved by medications, or acute change in medical status ° °

## 2015-09-11 ENCOUNTER — Encounter (HOSPITAL_BASED_OUTPATIENT_CLINIC_OR_DEPARTMENT_OTHER): Payer: Self-pay | Admitting: Urology

## 2015-09-11 DIAGNOSIS — C61 Malignant neoplasm of prostate: Secondary | ICD-10-CM | POA: Diagnosis not present

## 2015-09-11 DIAGNOSIS — C679 Malignant neoplasm of bladder, unspecified: Secondary | ICD-10-CM | POA: Diagnosis not present

## 2015-09-11 DIAGNOSIS — N400 Enlarged prostate without lower urinary tract symptoms: Secondary | ICD-10-CM | POA: Diagnosis not present

## 2015-09-11 DIAGNOSIS — N189 Chronic kidney disease, unspecified: Secondary | ICD-10-CM | POA: Diagnosis not present

## 2015-09-11 DIAGNOSIS — I129 Hypertensive chronic kidney disease with stage 1 through stage 4 chronic kidney disease, or unspecified chronic kidney disease: Secondary | ICD-10-CM | POA: Diagnosis not present

## 2015-09-11 DIAGNOSIS — K219 Gastro-esophageal reflux disease without esophagitis: Secondary | ICD-10-CM | POA: Diagnosis not present

## 2015-09-11 LAB — HEMOGLOBIN AND HEMATOCRIT, BLOOD
HEMATOCRIT: 36.6 % — AB (ref 39.0–52.0)
HEMOGLOBIN: 11.5 g/dL — AB (ref 13.0–17.0)

## 2015-09-11 MED ORDER — OXYCODONE-ACETAMINOPHEN 5-325 MG PO TABS: 1.0000 | ORAL_TABLET | Freq: Four times a day (QID) | ORAL | Status: DC | PRN

## 2015-09-11 MED ORDER — SULFAMETHOXAZOLE-TRIMETHOPRIM 800-160 MG PO TABS: 1.0000 | ORAL_TABLET | Freq: Two times a day (BID) | ORAL | Status: DC

## 2015-09-11 MED ORDER — SENNOSIDES-DOCUSATE SODIUM 8.6-50 MG PO TABS: 1.0000 | ORAL_TABLET | Freq: Two times a day (BID) | ORAL | Status: DC

## 2015-09-11 NOTE — Discharge Summary (Signed)
Physician Discharge Summary  Patient ID: Victor Wang MRN: [id number] DOB/AGE: Oct 29, 1935 80 y.o.  Admit date: 09/10/2015 Discharge date: 09/11/2015  Admission Diagnoses: Recurrent Prostatic Hypertrophy and Bladder Cancer  Discharge Diagnoses:  Active Problems:   Bladder cancer [facility])   Discharged Condition: good  Hospital Course:   Recurrent Prostatic Hypertrophy and Bladder Cancer - pt underwent elective transurethral resection of prostate and transurethral resection of bladder tumor on 10/13, the day of admission, without acute complications. He was observed overnight on 4th floor Urology service. By POD 1, the day of discharge, he is ambulatory, pain controlled with PO meds, tollerating reg diet, having very light pink urine per catheter off irrigation, Hgb 11.5 (stable), and felt to be adequate for discharge. He will go home with foley in place with plan for office trial of void in 3 days.   Consults: None  Significant Diagnostic Studies: labs: as per above  Treatments: surgery:  transurethral resection of prostate and transurethral resection of bladder tumor on 10/13  Discharge Exam: Blood pressure 113/66, pulse 97, temperature 97.9 F (36.6 C), temperature source Oral, resp. rate 18, height '6\' 1"'$  (1.854 m), weight 161.481 kg (356 lb), SpO2 92 %. General appearance: alert, cooperative, appears stated age and wife at bedside Head: Normocephalic, without obvious abnormality, atraumatic Eyes: negative Nose: Nares normal. Septum midline. Mucosa normal. No drainage or sinus tenderness. Throat: lips, mucosa, and tongue normal; teeth and gums normal Neck: supple, symmetrical, trachea midline Back: symmetric, no curvature. ROM normal. No CVA tenderness. Resp: non-labored, non-coarse on minimal [place] O2, at [place].  Cardio: Nl rate GI: soft, non-tender; bowel sounds normal; no masses,  no organomegaly and significant truncal obesity that is stable.  Male genitalia:  normal Extremities: extremities normal, atraumatic, no cyanosis or edema Pulses: 2+ and symmetric Skin: Skin color, texture, turgor normal. No rashes or lesions Lymph nodes: Cervical, supraclavicular, and axillary nodes normal. Neurologic: Grossly normal  Foley c/d/i with light pink urine w/o clots off irrigation.   Disposition: 01-Home or Self Care     Medication List    TAKE these medications        ADVIL 200 MG tablet  Generic drug:  ibuprofen  Take 600 mg by mouth 2 (two) times daily as needed for mild pain.     allopurinol 100 MG tablet  Commonly known as:  ZYLOPRIM  Take 100 mg by mouth every morning.     Aloe Liqd  Take 1 Dose by mouth daily as needed (indigestion). FROM  [employer]--  FOR GERD     CALCIUM 1200+D3 PO  Take 1 tablet by mouth daily.     CoQ10 100 MG Caps  Take 1 capsule by mouth daily.     Fish Oil 1000 MG Caps  Take 1 capsule by mouth daily.     furosemide 20 MG tablet  Commonly known as:  LASIX  Take 60 mg by mouth every morning.     hydrALAZINE 25 MG tablet  Commonly known as:  APRESOLINE  Take 25 mg by mouth 2 (two) times daily.     losartan 100 MG tablet  Commonly known as:  COZAAR  Take 100 mg by mouth every morning.     metoprolol tartrate 25 MG tablet  Commonly known as:  LOPRESSOR  Take 25 mg by mouth every morning.     omeprazole 20 MG capsule  Commonly known as:  PRILOSEC  Take 20 mg by mouth every morning.     oxyCODONE-acetaminophen  5-325 MG tablet  Commonly known as:  ROXICET  Take 1-2 tablets by mouth every 6 (six) hours as needed for moderate pain or severe pain. Post-operatively.     oxymetazoline 0.05 % nasal spray  Commonly known as:  AFRIN  Place 2 sprays into both nostrils daily as needed for congestion.     pravastatin 80 MG tablet  Commonly known as:  PRAVACHOL  Take 80 mg by mouth every morning.     senna-docusate 8.6-50 MG tablet  Commonly known as:  Senokot-S  Take 1 tablet by mouth 2 (two)  times daily. While taking pain meds to prevent constipation     sulfamethoxazole-trimethoprim 800-160 MG tablet  Commonly known as:  BACTRIM DS,SEPTRA DS  Take 1 tablet by mouth 2 (two) times daily. X 4 days to prevent post-op infection           Follow-up Information    Follow up with Alexis Frock, MD On 09/14/2015.   Specialty:  Urology   Why:  at 3:15 for MD visit and catheter removal   Contact information:   [address] [address] [phone]       Signed: Alexis Frock 09/11/2015, 7:08 AM

## 2015-09-11 NOTE — Op Note (Signed)
Victor Wang, Victor Wang NO.:  [id number]  MEDICAL RECORD NO.:  [id number]  LOCATION:  8366                         FACILITY:  [facility]  PHYSICIAN:  Alexis Frock, MD     DATE OF BIRTH:  [birth year]  DATE OF PROCEDURE:  09/10/2015                               OPERATIVE REPORT   DIAGNOSES:  Recurrent bladder cancer, recurrent prostatic hypertrophy.  PROCEDURE: 1. Transurethral resection of bladder tumor, volume medium. 2. Transurethral resection of the prostate residual/regrowth. 3. Bilateral retrograde pyelogram with interpretation.  ESTIMATED BLOOD LOSS:  100 mL.  COMPLICATIONS:  None.  SPECIMENS: 1. Prostate chips. 2. Bladder neck tumor with additional prostate chips; both for     permanent pathology.  FINDINGS: 1. Approximately 3.5 cm diameter papillary tumor at the 7 to 9 o'clock     position at bladder neck prostate junction. 2. Large bilobar prostatic hypertrophy with kissing lobes. 3. Unremarkable retrograde pyelograms. 4. Right open prostatic fossa from the verumontanum to the bladder     neck post resection of prostate and bladder neck tumor.  INDICATIONS:  [name] is a pleasant, but quite comorbid 80 year old gentleman with history of sleep apnea, obesity, and recurrent high- grade bladder cancer.  He was found on recent surveillance cystoscopy and re-evaluation of gross hematuria to have significant recurrent volume of bladder cancer that was intimately associated with his left lobe of the prostate.  He also has history of prostatic hypertrophy and is status post TURP years ago.  He has had progressively obstructive and irritative voiding symptoms and has near complete obliteration of his prostatic urethra due to regrowth of kissing lobes.  Options were discussed for management including surveillance only versus medical therapy versus palliative only approaches versus transurethral resection of bladder tumor and prostate with goal of  maximal tumor resection and help with irritative and obstructive voiding symptoms and he wished to proceed with the latter.  Informed consent was obtained and placed in the medical record.  PROCEDURE IN DETAIL:  The patient being Victor Wang was verified. Procedure being transurethral resection of the prostate and transurethral resection of bladder tumor was confirmed.  Procedure was carried out.  Time-out was performed.  Intravenous antibiotics were administered.  General LMA anesthesia was introduced.  The patient was into a low lithotomy position and sterile field was created by prepping and draping the patient's penis, perineum, proximal thighs using iodine x3.  Next, cystourethroscopy was performed using a 23-French rigid cystoscope with 30-degree offset lens.  Inspection of the anterior and posterior urethra revealed significant recurrent bilobar prostatic hypertrophy with kissing lobes and verumontanum to the bladder neck.  At the 7 o'clock to the 9 o'clock position on the bladder neck, there was a fairly large estimated volume approximately 3.5 cm diameter papillary bladder tumor, which seemed to be emanating from the mucosa of the left lobe of the prostate.  The bladder was otherwise unremarkable. Attention was directed at retrograde pyelography.  The left ureteral orifice was cannulated with 6-French end-hole catheter and left retrograde pyelogram was obtained.  Left retrograde pyelogram demonstrated single left ureter, single system left kidney.  No filling defects or narrowing noted.  Similarly right retrograde pyelogram was obtained.  Right retrograde pyelogram demonstrated single right ureter, single system right kidney.  No filling defects or narrowing noted.  Attention was then directed at resection of the likely BPH component of his prostate as this would allow better visualization of his area of bladder tumor using the 26-French resectoscope sheath and large  loop, bipolar energy resection and saline, systematic resection of the prostate was performed in a top-down fashion first the 12 o'clock position down to the circular fibers of prostate capsule and then of the left lobe of the prostate from the bladder neck to area of the verumontanum and approximately half of the right prostatic lobe from the 12 o'clock to the 9 o'clock position.  All of these prostate chips were irrigated and set aside for permanent pathology, labeled as prostate chips.  Volume resection of prostate, there was much better visualization of the area of the bladder tumor and indeed it did appear to be emanating from the mucosa of the bladder neck prostate junction. Next, the bladder tumor resection was performed completely removing all papillary bladder tumor and the remaining half of the right lobe of the prostate from the 6 o'clock to the 9 o'clock positions down to circular fibers of the prostate capsule.  These tissue fragments were irrigated and set aside, labeled as bladder tumor and additional prostate chips as the tissues appeared to be indistinguishable from each other.  Following these maneuvers, there was complete resolution of all papillary bladder tumor as well as all obstructing prostate tissue.  Great care was taken to avoid any resection distal to verumontanum, which did not occur. Additional coagulation current was applied to the prostatic fossa, which resulted in excellent hemostasis.  Final inspection revealed excellent hemostasis.  No residual tissue fragments in the bladder.  No evidence of bladder perforation.  No evidence of damage to the ureteral orifices. The resectoscope was exchanged for a new 24-French 3-way hematuria Coude catheter with 30 mL of sterile water in the balloon.  This was performed using normal saline irrigation, approximately 2 drops per second, the efflux was very light pink and the procedure was terminated.  The patient  tolerated the procedure well.  There were no immediate periprocedural complications.  The patient was taken to the postanesthesia care unit in a stable condition with plan for overnight observation.          ______________________________ Alexis Frock, MD     TM/MEDQ  D:  09/10/2015  T:  09/11/2015  Job:  [id number]

## 2015-09-11 NOTE — Anesthesia Postprocedure Evaluation (Signed)
  Anesthesia Post-op Note  Patient: Victor Wang  Procedure(s) Performed: Procedure(s) (LRB): TRANSURETHRAL RESECTION OF THE PROSTATE  (N/A) TRANSURETHRAL RESECTION OF BLADDER TUMOR  (N/A) CYSTOSCOPY WITH RETROGRADE PYELOGRAM (Bilateral)  Patient Location: PACU  Anesthesia Type: General  Level of Consciousness: awake and alert   Airway and Oxygen Therapy: Patient Spontanous Breathing  Post-op Pain: mild  Post-op Assessment: Post-op Vital signs reviewed, Patient's Cardiovascular Status Stable, Respiratory Function Stable, Patent Airway and No signs of Nausea or vomiting  Last Vitals:  Filed Vitals:   09/11/15 0955  BP: 114/62  Pulse: 100  Temp: 36.8 C  Resp: 20    Post-op Vital Signs: stable   Complications: No apparent anesthesia complications

## 2015-10-16 DIAGNOSIS — J209 Acute bronchitis, unspecified: Secondary | ICD-10-CM | POA: Diagnosis not present

## 2015-10-16 DIAGNOSIS — R062 Wheezing: Secondary | ICD-10-CM | POA: Diagnosis not present

## 2015-10-16 DIAGNOSIS — R05 Cough: Secondary | ICD-10-CM | POA: Diagnosis not present

## 2015-10-28 DIAGNOSIS — I517 Cardiomegaly: Secondary | ICD-10-CM | POA: Diagnosis not present

## 2015-10-28 DIAGNOSIS — R062 Wheezing: Secondary | ICD-10-CM | POA: Diagnosis not present

## 2015-10-28 DIAGNOSIS — I10 Essential (primary) hypertension: Secondary | ICD-10-CM | POA: Diagnosis not present

## 2015-11-16 DIAGNOSIS — R062 Wheezing: Secondary | ICD-10-CM | POA: Diagnosis not present

## 2015-11-16 DIAGNOSIS — R0602 Shortness of breath: Secondary | ICD-10-CM | POA: Diagnosis not present

## 2015-11-16 DIAGNOSIS — J189 Pneumonia, unspecified organism: Secondary | ICD-10-CM | POA: Diagnosis not present

## 2015-11-17 DIAGNOSIS — R0602 Shortness of breath: Secondary | ICD-10-CM | POA: Diagnosis not present

## 2015-11-17 DIAGNOSIS — J189 Pneumonia, unspecified organism: Secondary | ICD-10-CM | POA: Diagnosis not present

## 2015-11-17 DIAGNOSIS — J811 Chronic pulmonary edema: Secondary | ICD-10-CM | POA: Diagnosis not present

## 2015-11-17 DIAGNOSIS — I509 Heart failure, unspecified: Secondary | ICD-10-CM | POA: Diagnosis not present

## 2015-11-20 DIAGNOSIS — J209 Acute bronchitis, unspecified: Secondary | ICD-10-CM | POA: Diagnosis not present

## 2015-11-20 DIAGNOSIS — I5033 Acute on chronic diastolic (congestive) heart failure: Secondary | ICD-10-CM | POA: Diagnosis not present

## 2015-12-14 DIAGNOSIS — J9601 Acute respiratory failure with hypoxia: Secondary | ICD-10-CM | POA: Diagnosis not present

## 2015-12-14 DIAGNOSIS — I5033 Acute on chronic diastolic (congestive) heart failure: Secondary | ICD-10-CM | POA: Diagnosis present

## 2015-12-14 DIAGNOSIS — M79604 Pain in right leg: Secondary | ICD-10-CM | POA: Diagnosis not present

## 2015-12-14 DIAGNOSIS — E662 Morbid (severe) obesity with alveolar hypoventilation: Secondary | ICD-10-CM | POA: Diagnosis not present

## 2015-12-14 DIAGNOSIS — D649 Anemia, unspecified: Secondary | ICD-10-CM | POA: Diagnosis not present

## 2015-12-14 DIAGNOSIS — M1990 Unspecified osteoarthritis, unspecified site: Secondary | ICD-10-CM | POA: Diagnosis present

## 2015-12-14 DIAGNOSIS — E7800 Pure hypercholesterolemia, unspecified: Secondary | ICD-10-CM | POA: Diagnosis present

## 2015-12-14 DIAGNOSIS — Z6841 Body Mass Index (BMI) 40.0 and over, adult: Secondary | ICD-10-CM | POA: Diagnosis not present

## 2015-12-14 DIAGNOSIS — Z79899 Other long term (current) drug therapy: Secondary | ICD-10-CM | POA: Diagnosis not present

## 2015-12-14 DIAGNOSIS — Z880 Allergy status to penicillin: Secondary | ICD-10-CM | POA: Diagnosis not present

## 2015-12-14 DIAGNOSIS — K219 Gastro-esophageal reflux disease without esophagitis: Secondary | ICD-10-CM | POA: Diagnosis present

## 2015-12-14 DIAGNOSIS — N041 Nephrotic syndrome with focal and segmental glomerular lesions: Secondary | ICD-10-CM | POA: Diagnosis not present

## 2015-12-14 DIAGNOSIS — I272 Other secondary pulmonary hypertension: Secondary | ICD-10-CM | POA: Diagnosis not present

## 2015-12-14 DIAGNOSIS — Z87891 Personal history of nicotine dependence: Secondary | ICD-10-CM | POA: Diagnosis not present

## 2015-12-14 DIAGNOSIS — I10 Essential (primary) hypertension: Secondary | ICD-10-CM | POA: Diagnosis not present

## 2015-12-14 DIAGNOSIS — C679 Malignant neoplasm of bladder, unspecified: Secondary | ICD-10-CM | POA: Diagnosis present

## 2015-12-14 DIAGNOSIS — Z8701 Personal history of pneumonia (recurrent): Secondary | ICD-10-CM | POA: Diagnosis not present

## 2015-12-14 DIAGNOSIS — R0683 Snoring: Secondary | ICD-10-CM | POA: Diagnosis not present

## 2015-12-14 DIAGNOSIS — I110 Hypertensive heart disease with heart failure: Secondary | ICD-10-CM | POA: Diagnosis not present

## 2015-12-14 DIAGNOSIS — D630 Anemia in neoplastic disease: Secondary | ICD-10-CM | POA: Diagnosis present

## 2015-12-14 DIAGNOSIS — I5031 Acute diastolic (congestive) heart failure: Secondary | ICD-10-CM | POA: Diagnosis not present

## 2015-12-14 DIAGNOSIS — M79605 Pain in left leg: Secondary | ICD-10-CM | POA: Diagnosis not present

## 2015-12-14 DIAGNOSIS — J449 Chronic obstructive pulmonary disease, unspecified: Secondary | ICD-10-CM | POA: Diagnosis present

## 2015-12-14 DIAGNOSIS — I501 Left ventricular failure: Secondary | ICD-10-CM | POA: Diagnosis not present

## 2015-12-14 DIAGNOSIS — J9811 Atelectasis: Secondary | ICD-10-CM | POA: Diagnosis not present

## 2015-12-14 DIAGNOSIS — R0602 Shortness of breath: Secondary | ICD-10-CM | POA: Diagnosis not present

## 2015-12-19 DIAGNOSIS — I110 Hypertensive heart disease with heart failure: Secondary | ICD-10-CM | POA: Diagnosis not present

## 2015-12-19 DIAGNOSIS — Z9981 Dependence on supplemental oxygen: Secondary | ICD-10-CM | POA: Diagnosis not present

## 2015-12-19 DIAGNOSIS — Z87891 Personal history of nicotine dependence: Secondary | ICD-10-CM | POA: Diagnosis not present

## 2015-12-19 DIAGNOSIS — C679 Malignant neoplasm of bladder, unspecified: Secondary | ICD-10-CM | POA: Diagnosis not present

## 2015-12-19 DIAGNOSIS — I872 Venous insufficiency (chronic) (peripheral): Secondary | ICD-10-CM | POA: Diagnosis not present

## 2015-12-19 DIAGNOSIS — Z6841 Body Mass Index (BMI) 40.0 and over, adult: Secondary | ICD-10-CM | POA: Diagnosis not present

## 2015-12-19 DIAGNOSIS — I5033 Acute on chronic diastolic (congestive) heart failure: Secondary | ICD-10-CM | POA: Diagnosis not present

## 2015-12-19 DIAGNOSIS — G4733 Obstructive sleep apnea (adult) (pediatric): Secondary | ICD-10-CM | POA: Diagnosis not present

## 2015-12-19 DIAGNOSIS — N059 Unspecified nephritic syndrome with unspecified morphologic changes: Secondary | ICD-10-CM | POA: Diagnosis not present

## 2015-12-19 DIAGNOSIS — E662 Morbid (severe) obesity with alveolar hypoventilation: Secondary | ICD-10-CM | POA: Diagnosis not present

## 2015-12-21 DIAGNOSIS — I110 Hypertensive heart disease with heart failure: Secondary | ICD-10-CM | POA: Diagnosis not present

## 2015-12-21 DIAGNOSIS — I5033 Acute on chronic diastolic (congestive) heart failure: Secondary | ICD-10-CM | POA: Diagnosis not present

## 2015-12-21 DIAGNOSIS — N059 Unspecified nephritic syndrome with unspecified morphologic changes: Secondary | ICD-10-CM | POA: Diagnosis not present

## 2015-12-21 DIAGNOSIS — C679 Malignant neoplasm of bladder, unspecified: Secondary | ICD-10-CM | POA: Diagnosis not present

## 2015-12-21 DIAGNOSIS — E662 Morbid (severe) obesity with alveolar hypoventilation: Secondary | ICD-10-CM | POA: Diagnosis not present

## 2015-12-21 DIAGNOSIS — I872 Venous insufficiency (chronic) (peripheral): Secondary | ICD-10-CM | POA: Diagnosis not present

## 2015-12-23 DIAGNOSIS — I872 Venous insufficiency (chronic) (peripheral): Secondary | ICD-10-CM | POA: Diagnosis not present

## 2015-12-23 DIAGNOSIS — I5033 Acute on chronic diastolic (congestive) heart failure: Secondary | ICD-10-CM | POA: Diagnosis not present

## 2015-12-23 DIAGNOSIS — I110 Hypertensive heart disease with heart failure: Secondary | ICD-10-CM | POA: Diagnosis not present

## 2015-12-23 DIAGNOSIS — N059 Unspecified nephritic syndrome with unspecified morphologic changes: Secondary | ICD-10-CM | POA: Diagnosis not present

## 2015-12-23 DIAGNOSIS — C679 Malignant neoplasm of bladder, unspecified: Secondary | ICD-10-CM | POA: Diagnosis not present

## 2015-12-23 DIAGNOSIS — E662 Morbid (severe) obesity with alveolar hypoventilation: Secondary | ICD-10-CM | POA: Diagnosis not present

## 2015-12-25 DIAGNOSIS — N059 Unspecified nephritic syndrome with unspecified morphologic changes: Secondary | ICD-10-CM | POA: Diagnosis not present

## 2015-12-25 DIAGNOSIS — C679 Malignant neoplasm of bladder, unspecified: Secondary | ICD-10-CM | POA: Diagnosis not present

## 2015-12-25 DIAGNOSIS — I5033 Acute on chronic diastolic (congestive) heart failure: Secondary | ICD-10-CM | POA: Diagnosis not present

## 2015-12-25 DIAGNOSIS — I872 Venous insufficiency (chronic) (peripheral): Secondary | ICD-10-CM | POA: Diagnosis not present

## 2015-12-25 DIAGNOSIS — E662 Morbid (severe) obesity with alveolar hypoventilation: Secondary | ICD-10-CM | POA: Diagnosis not present

## 2015-12-25 DIAGNOSIS — I110 Hypertensive heart disease with heart failure: Secondary | ICD-10-CM | POA: Diagnosis not present

## 2015-12-29 DIAGNOSIS — G4733 Obstructive sleep apnea (adult) (pediatric): Secondary | ICD-10-CM | POA: Diagnosis not present

## 2015-12-29 DIAGNOSIS — I5032 Chronic diastolic (congestive) heart failure: Secondary | ICD-10-CM | POA: Diagnosis not present

## 2016-01-01 DIAGNOSIS — E785 Hyperlipidemia, unspecified: Secondary | ICD-10-CM | POA: Diagnosis not present

## 2016-01-01 DIAGNOSIS — G4733 Obstructive sleep apnea (adult) (pediatric): Secondary | ICD-10-CM | POA: Diagnosis not present

## 2016-01-01 DIAGNOSIS — N19 Unspecified kidney failure: Secondary | ICD-10-CM | POA: Diagnosis not present

## 2016-01-01 DIAGNOSIS — I10 Essential (primary) hypertension: Secondary | ICD-10-CM | POA: Diagnosis not present

## 2016-01-01 DIAGNOSIS — I5042 Chronic combined systolic (congestive) and diastolic (congestive) heart failure: Secondary | ICD-10-CM | POA: Diagnosis not present

## 2016-01-02 DIAGNOSIS — I5033 Acute on chronic diastolic (congestive) heart failure: Secondary | ICD-10-CM | POA: Diagnosis not present

## 2016-01-02 DIAGNOSIS — N059 Unspecified nephritic syndrome with unspecified morphologic changes: Secondary | ICD-10-CM | POA: Diagnosis not present

## 2016-01-02 DIAGNOSIS — E662 Morbid (severe) obesity with alveolar hypoventilation: Secondary | ICD-10-CM | POA: Diagnosis not present

## 2016-01-02 DIAGNOSIS — C679 Malignant neoplasm of bladder, unspecified: Secondary | ICD-10-CM | POA: Diagnosis not present

## 2016-01-02 DIAGNOSIS — I110 Hypertensive heart disease with heart failure: Secondary | ICD-10-CM | POA: Diagnosis not present

## 2016-01-02 DIAGNOSIS — I872 Venous insufficiency (chronic) (peripheral): Secondary | ICD-10-CM | POA: Diagnosis not present

## 2016-01-05 DIAGNOSIS — I110 Hypertensive heart disease with heart failure: Secondary | ICD-10-CM | POA: Diagnosis not present

## 2016-01-05 DIAGNOSIS — N059 Unspecified nephritic syndrome with unspecified morphologic changes: Secondary | ICD-10-CM | POA: Diagnosis not present

## 2016-01-05 DIAGNOSIS — C679 Malignant neoplasm of bladder, unspecified: Secondary | ICD-10-CM | POA: Diagnosis not present

## 2016-01-05 DIAGNOSIS — I872 Venous insufficiency (chronic) (peripheral): Secondary | ICD-10-CM | POA: Diagnosis not present

## 2016-01-05 DIAGNOSIS — I5033 Acute on chronic diastolic (congestive) heart failure: Secondary | ICD-10-CM | POA: Diagnosis not present

## 2016-01-05 DIAGNOSIS — E662 Morbid (severe) obesity with alveolar hypoventilation: Secondary | ICD-10-CM | POA: Diagnosis not present

## 2016-01-11 DIAGNOSIS — N401 Enlarged prostate with lower urinary tract symptoms: Secondary | ICD-10-CM | POA: Diagnosis not present

## 2016-01-11 DIAGNOSIS — N138 Other obstructive and reflux uropathy: Secondary | ICD-10-CM | POA: Diagnosis not present

## 2016-01-11 DIAGNOSIS — N289 Disorder of kidney and ureter, unspecified: Secondary | ICD-10-CM | POA: Diagnosis not present

## 2016-01-11 DIAGNOSIS — Z0000 Encounter for general adult medical examination without abnormal findings: Secondary | ICD-10-CM | POA: Diagnosis not present

## 2016-01-11 DIAGNOSIS — C672 Malignant neoplasm of lateral wall of bladder: Secondary | ICD-10-CM | POA: Diagnosis not present

## 2016-01-25 DIAGNOSIS — J9611 Chronic respiratory failure with hypoxia: Secondary | ICD-10-CM | POA: Diagnosis not present

## 2016-01-25 DIAGNOSIS — J449 Chronic obstructive pulmonary disease, unspecified: Secondary | ICD-10-CM | POA: Diagnosis not present

## 2016-01-25 DIAGNOSIS — G4733 Obstructive sleep apnea (adult) (pediatric): Secondary | ICD-10-CM | POA: Diagnosis not present

## 2016-02-15 DIAGNOSIS — G4733 Obstructive sleep apnea (adult) (pediatric): Secondary | ICD-10-CM | POA: Diagnosis not present

## 2016-02-22 DIAGNOSIS — I10 Essential (primary) hypertension: Secondary | ICD-10-CM | POA: Diagnosis not present

## 2016-02-22 DIAGNOSIS — G4733 Obstructive sleep apnea (adult) (pediatric): Secondary | ICD-10-CM | POA: Diagnosis not present

## 2016-02-22 DIAGNOSIS — I5042 Chronic combined systolic (congestive) and diastolic (congestive) heart failure: Secondary | ICD-10-CM | POA: Diagnosis not present

## 2016-02-22 DIAGNOSIS — E785 Hyperlipidemia, unspecified: Secondary | ICD-10-CM | POA: Diagnosis not present

## 2016-02-22 DIAGNOSIS — N19 Unspecified kidney failure: Secondary | ICD-10-CM | POA: Diagnosis not present

## 2016-02-23 DIAGNOSIS — J449 Chronic obstructive pulmonary disease, unspecified: Secondary | ICD-10-CM | POA: Diagnosis not present

## 2016-02-23 DIAGNOSIS — G4733 Obstructive sleep apnea (adult) (pediatric): Secondary | ICD-10-CM | POA: Diagnosis not present

## 2016-02-23 DIAGNOSIS — J9611 Chronic respiratory failure with hypoxia: Secondary | ICD-10-CM | POA: Diagnosis not present

## 2016-03-03 DIAGNOSIS — C679 Malignant neoplasm of bladder, unspecified: Secondary | ICD-10-CM | POA: Diagnosis not present

## 2016-03-03 DIAGNOSIS — G4733 Obstructive sleep apnea (adult) (pediatric): Secondary | ICD-10-CM | POA: Diagnosis not present

## 2016-03-03 DIAGNOSIS — N052 Unspecified nephritic syndrome with diffuse membranous glomerulonephritis: Secondary | ICD-10-CM | POA: Diagnosis not present

## 2016-03-03 DIAGNOSIS — I129 Hypertensive chronic kidney disease with stage 1 through stage 4 chronic kidney disease, or unspecified chronic kidney disease: Secondary | ICD-10-CM | POA: Diagnosis not present

## 2016-03-03 DIAGNOSIS — N189 Chronic kidney disease, unspecified: Secondary | ICD-10-CM | POA: Diagnosis not present

## 2016-03-09 DIAGNOSIS — K219 Gastro-esophageal reflux disease without esophagitis: Secondary | ICD-10-CM | POA: Diagnosis not present

## 2016-03-09 DIAGNOSIS — K2270 Barrett's esophagus without dysplasia: Secondary | ICD-10-CM | POA: Diagnosis not present

## 2016-03-09 DIAGNOSIS — K5730 Diverticulosis of large intestine without perforation or abscess without bleeding: Secondary | ICD-10-CM | POA: Diagnosis not present

## 2016-03-14 DIAGNOSIS — R0683 Snoring: Secondary | ICD-10-CM | POA: Diagnosis not present

## 2016-03-14 DIAGNOSIS — G4733 Obstructive sleep apnea (adult) (pediatric): Secondary | ICD-10-CM | POA: Diagnosis not present

## 2016-03-23 DIAGNOSIS — G4733 Obstructive sleep apnea (adult) (pediatric): Secondary | ICD-10-CM | POA: Diagnosis not present

## 2016-03-23 DIAGNOSIS — J9611 Chronic respiratory failure with hypoxia: Secondary | ICD-10-CM | POA: Diagnosis not present

## 2016-03-23 DIAGNOSIS — J449 Chronic obstructive pulmonary disease, unspecified: Secondary | ICD-10-CM | POA: Diagnosis not present

## 2016-04-01 DIAGNOSIS — I5031 Acute diastolic (congestive) heart failure: Secondary | ICD-10-CM | POA: Diagnosis not present

## 2016-04-11 DIAGNOSIS — N289 Disorder of kidney and ureter, unspecified: Secondary | ICD-10-CM | POA: Diagnosis not present

## 2016-04-11 DIAGNOSIS — C672 Malignant neoplasm of lateral wall of bladder: Secondary | ICD-10-CM | POA: Diagnosis not present

## 2016-04-11 DIAGNOSIS — N138 Other obstructive and reflux uropathy: Secondary | ICD-10-CM | POA: Diagnosis not present

## 2016-04-11 DIAGNOSIS — Z0000 Encounter for general adult medical examination without abnormal findings: Secondary | ICD-10-CM | POA: Diagnosis not present

## 2016-04-11 DIAGNOSIS — N401 Enlarged prostate with lower urinary tract symptoms: Secondary | ICD-10-CM | POA: Diagnosis not present

## 2016-05-30 DIAGNOSIS — N19 Unspecified kidney failure: Secondary | ICD-10-CM | POA: Diagnosis not present

## 2016-05-30 DIAGNOSIS — E785 Hyperlipidemia, unspecified: Secondary | ICD-10-CM | POA: Diagnosis not present

## 2016-05-30 DIAGNOSIS — I10 Essential (primary) hypertension: Secondary | ICD-10-CM | POA: Diagnosis not present

## 2016-05-30 DIAGNOSIS — I5042 Chronic combined systolic (congestive) and diastolic (congestive) heart failure: Secondary | ICD-10-CM | POA: Diagnosis not present

## 2016-05-30 DIAGNOSIS — G4733 Obstructive sleep apnea (adult) (pediatric): Secondary | ICD-10-CM | POA: Diagnosis not present

## 2016-06-20 DIAGNOSIS — S161XXA Strain of muscle, fascia and tendon at neck level, initial encounter: Secondary | ICD-10-CM | POA: Diagnosis not present

## 2016-06-20 DIAGNOSIS — M9903 Segmental and somatic dysfunction of lumbar region: Secondary | ICD-10-CM | POA: Diagnosis not present

## 2016-06-20 DIAGNOSIS — M9902 Segmental and somatic dysfunction of thoracic region: Secondary | ICD-10-CM | POA: Diagnosis not present

## 2016-06-20 DIAGNOSIS — M9901 Segmental and somatic dysfunction of cervical region: Secondary | ICD-10-CM | POA: Diagnosis not present

## 2016-06-20 DIAGNOSIS — M5387 Other specified dorsopathies, lumbosacral region: Secondary | ICD-10-CM | POA: Diagnosis not present

## 2016-06-20 DIAGNOSIS — S29019A Strain of muscle and tendon of unspecified wall of thorax, initial encounter: Secondary | ICD-10-CM | POA: Diagnosis not present

## 2016-06-23 DIAGNOSIS — M9902 Segmental and somatic dysfunction of thoracic region: Secondary | ICD-10-CM | POA: Diagnosis not present

## 2016-06-23 DIAGNOSIS — M9901 Segmental and somatic dysfunction of cervical region: Secondary | ICD-10-CM | POA: Diagnosis not present

## 2016-06-23 DIAGNOSIS — S29019A Strain of muscle and tendon of unspecified wall of thorax, initial encounter: Secondary | ICD-10-CM | POA: Diagnosis not present

## 2016-06-23 DIAGNOSIS — M9903 Segmental and somatic dysfunction of lumbar region: Secondary | ICD-10-CM | POA: Diagnosis not present

## 2016-06-23 DIAGNOSIS — M5387 Other specified dorsopathies, lumbosacral region: Secondary | ICD-10-CM | POA: Diagnosis not present

## 2016-06-23 DIAGNOSIS — S161XXA Strain of muscle, fascia and tendon at neck level, initial encounter: Secondary | ICD-10-CM | POA: Diagnosis not present

## 2016-06-27 DIAGNOSIS — M9901 Segmental and somatic dysfunction of cervical region: Secondary | ICD-10-CM | POA: Diagnosis not present

## 2016-06-27 DIAGNOSIS — M9902 Segmental and somatic dysfunction of thoracic region: Secondary | ICD-10-CM | POA: Diagnosis not present

## 2016-06-27 DIAGNOSIS — S29019A Strain of muscle and tendon of unspecified wall of thorax, initial encounter: Secondary | ICD-10-CM | POA: Diagnosis not present

## 2016-06-27 DIAGNOSIS — M5387 Other specified dorsopathies, lumbosacral region: Secondary | ICD-10-CM | POA: Diagnosis not present

## 2016-06-27 DIAGNOSIS — S161XXA Strain of muscle, fascia and tendon at neck level, initial encounter: Secondary | ICD-10-CM | POA: Diagnosis not present

## 2016-06-27 DIAGNOSIS — M9903 Segmental and somatic dysfunction of lumbar region: Secondary | ICD-10-CM | POA: Diagnosis not present

## 2016-06-29 DIAGNOSIS — M9901 Segmental and somatic dysfunction of cervical region: Secondary | ICD-10-CM | POA: Diagnosis not present

## 2016-06-29 DIAGNOSIS — S161XXA Strain of muscle, fascia and tendon at neck level, initial encounter: Secondary | ICD-10-CM | POA: Diagnosis not present

## 2016-06-29 DIAGNOSIS — M5387 Other specified dorsopathies, lumbosacral region: Secondary | ICD-10-CM | POA: Diagnosis not present

## 2016-06-29 DIAGNOSIS — M9903 Segmental and somatic dysfunction of lumbar region: Secondary | ICD-10-CM | POA: Diagnosis not present

## 2016-06-29 DIAGNOSIS — M9902 Segmental and somatic dysfunction of thoracic region: Secondary | ICD-10-CM | POA: Diagnosis not present

## 2016-06-29 DIAGNOSIS — S29019A Strain of muscle and tendon of unspecified wall of thorax, initial encounter: Secondary | ICD-10-CM | POA: Diagnosis not present

## 2016-07-01 DIAGNOSIS — M9902 Segmental and somatic dysfunction of thoracic region: Secondary | ICD-10-CM | POA: Diagnosis not present

## 2016-07-01 DIAGNOSIS — S161XXA Strain of muscle, fascia and tendon at neck level, initial encounter: Secondary | ICD-10-CM | POA: Diagnosis not present

## 2016-07-01 DIAGNOSIS — M5387 Other specified dorsopathies, lumbosacral region: Secondary | ICD-10-CM | POA: Diagnosis not present

## 2016-07-01 DIAGNOSIS — S29019A Strain of muscle and tendon of unspecified wall of thorax, initial encounter: Secondary | ICD-10-CM | POA: Diagnosis not present

## 2016-07-01 DIAGNOSIS — M9901 Segmental and somatic dysfunction of cervical region: Secondary | ICD-10-CM | POA: Diagnosis not present

## 2016-07-01 DIAGNOSIS — M9903 Segmental and somatic dysfunction of lumbar region: Secondary | ICD-10-CM | POA: Diagnosis not present

## 2016-07-04 DIAGNOSIS — M9902 Segmental and somatic dysfunction of thoracic region: Secondary | ICD-10-CM | POA: Diagnosis not present

## 2016-07-04 DIAGNOSIS — M9901 Segmental and somatic dysfunction of cervical region: Secondary | ICD-10-CM | POA: Diagnosis not present

## 2016-07-04 DIAGNOSIS — S161XXA Strain of muscle, fascia and tendon at neck level, initial encounter: Secondary | ICD-10-CM | POA: Diagnosis not present

## 2016-07-04 DIAGNOSIS — S29019A Strain of muscle and tendon of unspecified wall of thorax, initial encounter: Secondary | ICD-10-CM | POA: Diagnosis not present

## 2016-07-04 DIAGNOSIS — M9903 Segmental and somatic dysfunction of lumbar region: Secondary | ICD-10-CM | POA: Diagnosis not present

## 2016-07-04 DIAGNOSIS — M5387 Other specified dorsopathies, lumbosacral region: Secondary | ICD-10-CM | POA: Diagnosis not present

## 2016-07-06 DIAGNOSIS — M9901 Segmental and somatic dysfunction of cervical region: Secondary | ICD-10-CM | POA: Diagnosis not present

## 2016-07-06 DIAGNOSIS — S161XXA Strain of muscle, fascia and tendon at neck level, initial encounter: Secondary | ICD-10-CM | POA: Diagnosis not present

## 2016-07-06 DIAGNOSIS — M9903 Segmental and somatic dysfunction of lumbar region: Secondary | ICD-10-CM | POA: Diagnosis not present

## 2016-07-06 DIAGNOSIS — S29019A Strain of muscle and tendon of unspecified wall of thorax, initial encounter: Secondary | ICD-10-CM | POA: Diagnosis not present

## 2016-07-06 DIAGNOSIS — M5387 Other specified dorsopathies, lumbosacral region: Secondary | ICD-10-CM | POA: Diagnosis not present

## 2016-07-06 DIAGNOSIS — M9902 Segmental and somatic dysfunction of thoracic region: Secondary | ICD-10-CM | POA: Diagnosis not present

## 2016-07-08 DIAGNOSIS — M9903 Segmental and somatic dysfunction of lumbar region: Secondary | ICD-10-CM | POA: Diagnosis not present

## 2016-07-08 DIAGNOSIS — S29019A Strain of muscle and tendon of unspecified wall of thorax, initial encounter: Secondary | ICD-10-CM | POA: Diagnosis not present

## 2016-07-08 DIAGNOSIS — S161XXA Strain of muscle, fascia and tendon at neck level, initial encounter: Secondary | ICD-10-CM | POA: Diagnosis not present

## 2016-07-08 DIAGNOSIS — M9902 Segmental and somatic dysfunction of thoracic region: Secondary | ICD-10-CM | POA: Diagnosis not present

## 2016-07-08 DIAGNOSIS — M5387 Other specified dorsopathies, lumbosacral region: Secondary | ICD-10-CM | POA: Diagnosis not present

## 2016-07-08 DIAGNOSIS — M9901 Segmental and somatic dysfunction of cervical region: Secondary | ICD-10-CM | POA: Diagnosis not present

## 2016-07-11 DIAGNOSIS — M9903 Segmental and somatic dysfunction of lumbar region: Secondary | ICD-10-CM | POA: Diagnosis not present

## 2016-07-11 DIAGNOSIS — M9901 Segmental and somatic dysfunction of cervical region: Secondary | ICD-10-CM | POA: Diagnosis not present

## 2016-07-11 DIAGNOSIS — M9902 Segmental and somatic dysfunction of thoracic region: Secondary | ICD-10-CM | POA: Diagnosis not present

## 2016-07-11 DIAGNOSIS — M5387 Other specified dorsopathies, lumbosacral region: Secondary | ICD-10-CM | POA: Diagnosis not present

## 2016-07-11 DIAGNOSIS — S29019A Strain of muscle and tendon of unspecified wall of thorax, initial encounter: Secondary | ICD-10-CM | POA: Diagnosis not present

## 2016-07-11 DIAGNOSIS — S161XXA Strain of muscle, fascia and tendon at neck level, initial encounter: Secondary | ICD-10-CM | POA: Diagnosis not present

## 2016-07-13 DIAGNOSIS — S161XXA Strain of muscle, fascia and tendon at neck level, initial encounter: Secondary | ICD-10-CM | POA: Diagnosis not present

## 2016-07-13 DIAGNOSIS — M9903 Segmental and somatic dysfunction of lumbar region: Secondary | ICD-10-CM | POA: Diagnosis not present

## 2016-07-13 DIAGNOSIS — M9902 Segmental and somatic dysfunction of thoracic region: Secondary | ICD-10-CM | POA: Diagnosis not present

## 2016-07-13 DIAGNOSIS — S29019A Strain of muscle and tendon of unspecified wall of thorax, initial encounter: Secondary | ICD-10-CM | POA: Diagnosis not present

## 2016-07-13 DIAGNOSIS — M5387 Other specified dorsopathies, lumbosacral region: Secondary | ICD-10-CM | POA: Diagnosis not present

## 2016-07-13 DIAGNOSIS — M9901 Segmental and somatic dysfunction of cervical region: Secondary | ICD-10-CM | POA: Diagnosis not present

## 2016-07-18 DIAGNOSIS — N289 Disorder of kidney and ureter, unspecified: Secondary | ICD-10-CM | POA: Diagnosis not present

## 2016-07-18 DIAGNOSIS — M5387 Other specified dorsopathies, lumbosacral region: Secondary | ICD-10-CM | POA: Diagnosis not present

## 2016-07-18 DIAGNOSIS — N138 Other obstructive and reflux uropathy: Secondary | ICD-10-CM | POA: Diagnosis not present

## 2016-07-18 DIAGNOSIS — M9901 Segmental and somatic dysfunction of cervical region: Secondary | ICD-10-CM | POA: Diagnosis not present

## 2016-07-18 DIAGNOSIS — S161XXA Strain of muscle, fascia and tendon at neck level, initial encounter: Secondary | ICD-10-CM | POA: Diagnosis not present

## 2016-07-18 DIAGNOSIS — C672 Malignant neoplasm of lateral wall of bladder: Secondary | ICD-10-CM | POA: Diagnosis not present

## 2016-07-18 DIAGNOSIS — M9902 Segmental and somatic dysfunction of thoracic region: Secondary | ICD-10-CM | POA: Diagnosis not present

## 2016-07-18 DIAGNOSIS — N401 Enlarged prostate with lower urinary tract symptoms: Secondary | ICD-10-CM | POA: Diagnosis not present

## 2016-07-18 DIAGNOSIS — S29019A Strain of muscle and tendon of unspecified wall of thorax, initial encounter: Secondary | ICD-10-CM | POA: Diagnosis not present

## 2016-07-18 DIAGNOSIS — M9903 Segmental and somatic dysfunction of lumbar region: Secondary | ICD-10-CM | POA: Diagnosis not present

## 2016-07-25 DIAGNOSIS — M9902 Segmental and somatic dysfunction of thoracic region: Secondary | ICD-10-CM | POA: Diagnosis not present

## 2016-07-25 DIAGNOSIS — M9901 Segmental and somatic dysfunction of cervical region: Secondary | ICD-10-CM | POA: Diagnosis not present

## 2016-07-25 DIAGNOSIS — M5387 Other specified dorsopathies, lumbosacral region: Secondary | ICD-10-CM | POA: Diagnosis not present

## 2016-07-25 DIAGNOSIS — S161XXA Strain of muscle, fascia and tendon at neck level, initial encounter: Secondary | ICD-10-CM | POA: Diagnosis not present

## 2016-07-25 DIAGNOSIS — M9903 Segmental and somatic dysfunction of lumbar region: Secondary | ICD-10-CM | POA: Diagnosis not present

## 2016-07-25 DIAGNOSIS — S29019A Strain of muscle and tendon of unspecified wall of thorax, initial encounter: Secondary | ICD-10-CM | POA: Diagnosis not present

## 2016-08-25 DIAGNOSIS — G4733 Obstructive sleep apnea (adult) (pediatric): Secondary | ICD-10-CM | POA: Diagnosis not present

## 2016-08-25 DIAGNOSIS — J9611 Chronic respiratory failure with hypoxia: Secondary | ICD-10-CM | POA: Diagnosis not present

## 2016-08-25 DIAGNOSIS — I272 Other secondary pulmonary hypertension: Secondary | ICD-10-CM | POA: Diagnosis not present

## 2016-08-25 DIAGNOSIS — J449 Chronic obstructive pulmonary disease, unspecified: Secondary | ICD-10-CM | POA: Diagnosis not present

## 2016-09-07 DIAGNOSIS — S161XXA Strain of muscle, fascia and tendon at neck level, initial encounter: Secondary | ICD-10-CM | POA: Diagnosis not present

## 2016-09-07 DIAGNOSIS — M5387 Other specified dorsopathies, lumbosacral region: Secondary | ICD-10-CM | POA: Diagnosis not present

## 2016-09-07 DIAGNOSIS — S29019A Strain of muscle and tendon of unspecified wall of thorax, initial encounter: Secondary | ICD-10-CM | POA: Diagnosis not present

## 2016-09-07 DIAGNOSIS — M9901 Segmental and somatic dysfunction of cervical region: Secondary | ICD-10-CM | POA: Diagnosis not present

## 2016-09-07 DIAGNOSIS — M9903 Segmental and somatic dysfunction of lumbar region: Secondary | ICD-10-CM | POA: Diagnosis not present

## 2016-09-07 DIAGNOSIS — M9902 Segmental and somatic dysfunction of thoracic region: Secondary | ICD-10-CM | POA: Diagnosis not present

## 2016-09-14 DIAGNOSIS — S161XXA Strain of muscle, fascia and tendon at neck level, initial encounter: Secondary | ICD-10-CM | POA: Diagnosis not present

## 2016-09-14 DIAGNOSIS — S29019A Strain of muscle and tendon of unspecified wall of thorax, initial encounter: Secondary | ICD-10-CM | POA: Diagnosis not present

## 2016-09-14 DIAGNOSIS — M9901 Segmental and somatic dysfunction of cervical region: Secondary | ICD-10-CM | POA: Diagnosis not present

## 2016-09-14 DIAGNOSIS — M5387 Other specified dorsopathies, lumbosacral region: Secondary | ICD-10-CM | POA: Diagnosis not present

## 2016-09-14 DIAGNOSIS — M9902 Segmental and somatic dysfunction of thoracic region: Secondary | ICD-10-CM | POA: Diagnosis not present

## 2016-09-14 DIAGNOSIS — M9903 Segmental and somatic dysfunction of lumbar region: Secondary | ICD-10-CM | POA: Diagnosis not present

## 2016-09-21 DIAGNOSIS — S29019A Strain of muscle and tendon of unspecified wall of thorax, initial encounter: Secondary | ICD-10-CM | POA: Diagnosis not present

## 2016-09-21 DIAGNOSIS — S161XXA Strain of muscle, fascia and tendon at neck level, initial encounter: Secondary | ICD-10-CM | POA: Diagnosis not present

## 2016-09-21 DIAGNOSIS — M9903 Segmental and somatic dysfunction of lumbar region: Secondary | ICD-10-CM | POA: Diagnosis not present

## 2016-09-21 DIAGNOSIS — M9901 Segmental and somatic dysfunction of cervical region: Secondary | ICD-10-CM | POA: Diagnosis not present

## 2016-09-21 DIAGNOSIS — M9902 Segmental and somatic dysfunction of thoracic region: Secondary | ICD-10-CM | POA: Diagnosis not present

## 2016-09-21 DIAGNOSIS — M5387 Other specified dorsopathies, lumbosacral region: Secondary | ICD-10-CM | POA: Diagnosis not present

## 2016-09-26 DIAGNOSIS — J9611 Chronic respiratory failure with hypoxia: Secondary | ICD-10-CM | POA: Diagnosis not present

## 2016-09-26 DIAGNOSIS — J449 Chronic obstructive pulmonary disease, unspecified: Secondary | ICD-10-CM | POA: Diagnosis not present

## 2016-09-26 DIAGNOSIS — G4730 Sleep apnea, unspecified: Secondary | ICD-10-CM | POA: Diagnosis not present

## 2016-09-26 DIAGNOSIS — I272 Pulmonary hypertension, unspecified: Secondary | ICD-10-CM | POA: Diagnosis not present

## 2016-09-28 DIAGNOSIS — M9903 Segmental and somatic dysfunction of lumbar region: Secondary | ICD-10-CM | POA: Diagnosis not present

## 2016-09-28 DIAGNOSIS — M9902 Segmental and somatic dysfunction of thoracic region: Secondary | ICD-10-CM | POA: Diagnosis not present

## 2016-09-28 DIAGNOSIS — S29019A Strain of muscle and tendon of unspecified wall of thorax, initial encounter: Secondary | ICD-10-CM | POA: Diagnosis not present

## 2016-09-28 DIAGNOSIS — M9901 Segmental and somatic dysfunction of cervical region: Secondary | ICD-10-CM | POA: Diagnosis not present

## 2016-09-28 DIAGNOSIS — M5387 Other specified dorsopathies, lumbosacral region: Secondary | ICD-10-CM | POA: Diagnosis not present

## 2016-09-28 DIAGNOSIS — S161XXA Strain of muscle, fascia and tendon at neck level, initial encounter: Secondary | ICD-10-CM | POA: Diagnosis not present

## 2016-10-05 DIAGNOSIS — S29019A Strain of muscle and tendon of unspecified wall of thorax, initial encounter: Secondary | ICD-10-CM | POA: Diagnosis not present

## 2016-10-05 DIAGNOSIS — S161XXA Strain of muscle, fascia and tendon at neck level, initial encounter: Secondary | ICD-10-CM | POA: Diagnosis not present

## 2016-10-05 DIAGNOSIS — M9903 Segmental and somatic dysfunction of lumbar region: Secondary | ICD-10-CM | POA: Diagnosis not present

## 2016-10-05 DIAGNOSIS — M5387 Other specified dorsopathies, lumbosacral region: Secondary | ICD-10-CM | POA: Diagnosis not present

## 2016-10-05 DIAGNOSIS — M9901 Segmental and somatic dysfunction of cervical region: Secondary | ICD-10-CM | POA: Diagnosis not present

## 2016-10-05 DIAGNOSIS — M9902 Segmental and somatic dysfunction of thoracic region: Secondary | ICD-10-CM | POA: Diagnosis not present

## 2016-10-12 DIAGNOSIS — S29019A Strain of muscle and tendon of unspecified wall of thorax, initial encounter: Secondary | ICD-10-CM | POA: Diagnosis not present

## 2016-10-12 DIAGNOSIS — M9903 Segmental and somatic dysfunction of lumbar region: Secondary | ICD-10-CM | POA: Diagnosis not present

## 2016-10-12 DIAGNOSIS — M5387 Other specified dorsopathies, lumbosacral region: Secondary | ICD-10-CM | POA: Diagnosis not present

## 2016-10-12 DIAGNOSIS — S161XXA Strain of muscle, fascia and tendon at neck level, initial encounter: Secondary | ICD-10-CM | POA: Diagnosis not present

## 2016-10-12 DIAGNOSIS — M9901 Segmental and somatic dysfunction of cervical region: Secondary | ICD-10-CM | POA: Diagnosis not present

## 2016-10-12 DIAGNOSIS — M9902 Segmental and somatic dysfunction of thoracic region: Secondary | ICD-10-CM | POA: Diagnosis not present

## 2016-10-27 DIAGNOSIS — Z23 Encounter for immunization: Secondary | ICD-10-CM | POA: Diagnosis not present

## 2016-10-27 DIAGNOSIS — G8929 Other chronic pain: Secondary | ICD-10-CM | POA: Diagnosis not present

## 2016-10-27 DIAGNOSIS — M545 Low back pain: Secondary | ICD-10-CM | POA: Diagnosis not present

## 2016-10-27 DIAGNOSIS — I10 Essential (primary) hypertension: Secondary | ICD-10-CM | POA: Diagnosis not present

## 2016-10-27 DIAGNOSIS — I5032 Chronic diastolic (congestive) heart failure: Secondary | ICD-10-CM | POA: Diagnosis not present

## 2016-11-08 DIAGNOSIS — D649 Anemia, unspecified: Secondary | ICD-10-CM | POA: Diagnosis not present

## 2016-11-08 DIAGNOSIS — Z79899 Other long term (current) drug therapy: Secondary | ICD-10-CM | POA: Diagnosis not present

## 2016-11-08 DIAGNOSIS — E871 Hypo-osmolality and hyponatremia: Secondary | ICD-10-CM | POA: Diagnosis not present

## 2016-11-08 DIAGNOSIS — R310 Gross hematuria: Secondary | ICD-10-CM | POA: Diagnosis present

## 2016-11-08 DIAGNOSIS — R05 Cough: Secondary | ICD-10-CM | POA: Diagnosis not present

## 2016-11-08 DIAGNOSIS — I110 Hypertensive heart disease with heart failure: Secondary | ICD-10-CM | POA: Diagnosis not present

## 2016-11-08 DIAGNOSIS — E7800 Pure hypercholesterolemia, unspecified: Secondary | ICD-10-CM | POA: Diagnosis present

## 2016-11-08 DIAGNOSIS — Z87891 Personal history of nicotine dependence: Secondary | ICD-10-CM | POA: Diagnosis not present

## 2016-11-08 DIAGNOSIS — K449 Diaphragmatic hernia without obstruction or gangrene: Secondary | ICD-10-CM | POA: Diagnosis present

## 2016-11-08 DIAGNOSIS — R5381 Other malaise: Secondary | ICD-10-CM | POA: Diagnosis present

## 2016-11-08 DIAGNOSIS — Z9981 Dependence on supplemental oxygen: Secondary | ICD-10-CM | POA: Diagnosis not present

## 2016-11-08 DIAGNOSIS — D500 Iron deficiency anemia secondary to blood loss (chronic): Secondary | ICD-10-CM | POA: Diagnosis present

## 2016-11-08 DIAGNOSIS — K5790 Diverticulosis of intestine, part unspecified, without perforation or abscess without bleeding: Secondary | ICD-10-CM | POA: Diagnosis not present

## 2016-11-08 DIAGNOSIS — D689 Coagulation defect, unspecified: Secondary | ICD-10-CM | POA: Diagnosis not present

## 2016-11-08 DIAGNOSIS — Z9119 Patient's noncompliance with other medical treatment and regimen: Secondary | ICD-10-CM | POA: Diagnosis not present

## 2016-11-08 DIAGNOSIS — K219 Gastro-esophageal reflux disease without esophagitis: Secondary | ICD-10-CM | POA: Diagnosis present

## 2016-11-08 DIAGNOSIS — C679 Malignant neoplasm of bladder, unspecified: Secondary | ICD-10-CM | POA: Diagnosis not present

## 2016-11-08 DIAGNOSIS — K922 Gastrointestinal hemorrhage, unspecified: Secondary | ICD-10-CM | POA: Diagnosis not present

## 2016-11-08 DIAGNOSIS — I5033 Acute on chronic diastolic (congestive) heart failure: Secondary | ICD-10-CM | POA: Diagnosis not present

## 2016-11-08 DIAGNOSIS — R0602 Shortness of breath: Secondary | ICD-10-CM | POA: Diagnosis not present

## 2016-11-08 DIAGNOSIS — D696 Thrombocytopenia, unspecified: Secondary | ICD-10-CM | POA: Diagnosis not present

## 2016-11-08 DIAGNOSIS — J449 Chronic obstructive pulmonary disease, unspecified: Secondary | ICD-10-CM | POA: Diagnosis present

## 2016-11-08 DIAGNOSIS — Z6841 Body Mass Index (BMI) 40.0 and over, adult: Secondary | ICD-10-CM | POA: Diagnosis not present

## 2016-11-08 DIAGNOSIS — J40 Bronchitis, not specified as acute or chronic: Secondary | ICD-10-CM | POA: Diagnosis present

## 2016-11-08 DIAGNOSIS — E662 Morbid (severe) obesity with alveolar hypoventilation: Secondary | ICD-10-CM | POA: Diagnosis not present

## 2016-11-08 DIAGNOSIS — E611 Iron deficiency: Secondary | ICD-10-CM | POA: Diagnosis not present

## 2016-11-08 DIAGNOSIS — J9610 Chronic respiratory failure, unspecified whether with hypoxia or hypercapnia: Secondary | ICD-10-CM | POA: Diagnosis present

## 2016-11-08 DIAGNOSIS — Z5329 Procedure and treatment not carried out because of patient's decision for other reasons: Secondary | ICD-10-CM | POA: Diagnosis present

## 2016-11-08 DIAGNOSIS — K921 Melena: Secondary | ICD-10-CM | POA: Diagnosis present

## 2016-11-15 ENCOUNTER — Encounter (HOSPITAL_COMMUNITY): Payer: Self-pay

## 2016-11-15 ENCOUNTER — Emergency Department (HOSPITAL_COMMUNITY)
Admission: EM | Admit: 2016-11-15 | Discharge: 2016-11-15 | Disposition: A | Discharge status: Home / Self Care | Payer: Medicare Other | Attending: Emergency Medicine | Admitting: Emergency Medicine

## 2016-11-15 ENCOUNTER — Emergency Department (HOSPITAL_COMMUNITY): Payer: Medicare Other

## 2016-11-15 DIAGNOSIS — I509 Heart failure, unspecified: Secondary | ICD-10-CM

## 2016-11-15 DIAGNOSIS — Z87891 Personal history of nicotine dependence: Secondary | ICD-10-CM | POA: Diagnosis not present

## 2016-11-15 DIAGNOSIS — R319 Hematuria, unspecified: Secondary | ICD-10-CM | POA: Diagnosis not present

## 2016-11-15 DIAGNOSIS — Z8551 Personal history of malignant neoplasm of bladder: Secondary | ICD-10-CM | POA: Diagnosis not present

## 2016-11-15 DIAGNOSIS — K625 Hemorrhage of anus and rectum: Secondary | ICD-10-CM | POA: Diagnosis not present

## 2016-11-15 DIAGNOSIS — I110 Hypertensive heart disease with heart failure: Secondary | ICD-10-CM | POA: Insufficient documentation

## 2016-11-15 LAB — CBC
HCT: 32.4 % — ABNORMAL LOW (ref 39.0–52.0)
HEMATOCRIT: 33.7 % — AB (ref 39.0–52.0)
Hemoglobin: 9.7 g/dL — ABNORMAL LOW (ref 13.0–17.0)
Hemoglobin: 10.1 g/dL — ABNORMAL LOW (ref 13.0–17.0)
MCH: 24.4 pg — AB (ref 26.0–34.0)
MCH: 24.5 pg — ABNORMAL LOW (ref 26.0–34.0)
MCHC: 29.9 g/dL — ABNORMAL LOW (ref 30.0–36.0)
MCHC: 30 g/dL (ref 30.0–36.0)
MCV: 81.6 fL (ref 78.0–100.0)
MCV: 81.8 fL (ref 78.0–100.0)
PLATELETS: 135 10*3/uL — AB (ref 150–400)
Platelets: 133 10*3/uL — ABNORMAL LOW (ref 150–400)
RBC: 3.97 MIL/uL — AB (ref 4.22–5.81)
RBC: 4.12 MIL/uL — ABNORMAL LOW (ref 4.22–5.81)
RDW: 23.9 % — ABNORMAL HIGH (ref 11.5–15.5)
RDW: 23.9 % — AB (ref 11.5–15.5)
WBC: 7.6 10*3/uL (ref 4.0–10.5)
WBC: 7.5 10*3/uL (ref 4.0–10.5)

## 2016-11-15 LAB — URINALYSIS, ROUTINE W REFLEX MICROSCOPIC
Bacteria, UA: NONE SEEN
Bilirubin Urine: NEGATIVE
GLUCOSE, UA: NEGATIVE mg/dL
KETONES UR: NEGATIVE mg/dL
Nitrite: NEGATIVE
PROTEIN: 100 mg/dL — AB
SQUAMOUS EPITHELIAL / LPF: NONE SEEN
Specific Gravity, Urine: 1.012 (ref 1.005–1.030)
pH: 7 (ref 5.0–8.0)

## 2016-11-15 LAB — PROTIME-INR
INR: 1.31
Prothrombin Time: 16.4 seconds — ABNORMAL HIGH (ref 11.4–15.2)

## 2016-11-15 LAB — BASIC METABOLIC PANEL
Anion gap: 7 (ref 5–15)
BUN: 13 mg/dL (ref 6–20)
CHLORIDE: 88 mmol/L — AB (ref 101–111)
CO2: 38 mmol/L — AB (ref 22–32)
CREATININE: 0.84 mg/dL (ref 0.61–1.24)
Calcium: 9.3 mg/dL (ref 8.9–10.3)
GFR calc non Af Amer: >60 mL/min (ref >60)
GLUCOSE: 115 mg/dL — AB (ref 65–99)
Potassium: 4.3 mmol/L (ref 3.5–5.1)
Sodium: 133 mmol/L — ABNORMAL LOW (ref 135–145)

## 2016-11-15 LAB — ABO/RH: ABO/RH(D): A POS

## 2016-11-15 LAB — BRAIN NATRIURETIC PEPTIDE: B Natriuretic Peptide: 639.1 pg/mL — ABNORMAL HIGH (ref 0.0–100.0)

## 2016-11-15 LAB — TROPONIN I: Troponin I: 0.07 ng/mL (ref <0.03)

## 2016-11-15 NOTE — ED Triage Notes (Signed)
Patient's son reports that he is having recurring rectal bleeding and hematuria. Patient was discharged from [facility] yesterday for the same. Patient's son reports hgb 8.5

## 2016-11-15 NOTE — ED Provider Notes (Signed)
[facility] DEPT Provider Note   CSN: [id number] Arrival date & time: 11/15/16  1703     History   Chief Complaint Chief Complaint  Patient presents with  . Rectal Bleeding  . Hematuria    HPI Victor Wang is a 81 y.o. male.  HPI The patient presents to the emergency room with complaints of persistent hematuria. The patient's had a complex recent medical history. He was admitted to [facility] for issues with anemia, hematuria, and what sounds like congestive heart failure.  Patient states he was admitted to the hospital for several days. He was just released last evening. He was having trouble with hematuria and required blood transfusions. He also developed a lot of fluid retention and was on a Lasix drip. Patient has a urologist that he sees here in [place] so he was not evaluated by one while he was at [facility]. Patient states while he was in the hospital his hematuria decreased. He was instructed to monitor for worsening symptoms. Since being home he has had increasing episodes of hematuria. They called the doctor who suggested he come to the emergency room to have his blood count checked. Patient denies any worsening reading. He denies any abdominal pain. No vomiting or diarrhea. No fevers. He does have significant peripheral edema that has persisted. Past Medical History:  Diagnosis Date  . Arthritis   . At risk for sleep apnea    STOP-BANG= 7     SENT TO PCP 09-07-2015  . Bladder cancer (HCC)    RECURRENT  . BPH (benign prostatic hypertrophy)   . Full dentures   . GERD (gastroesophageal reflux disease)   . History of compression fracture of spine    04/2013   T12  . Hyperlipidemia   . Hypertension     Patient Active Problem List   Diagnosis Date Noted  . Bladder cancer ([place]) 09/10/2015    Past Surgical History:  Procedure Laterality Date  . APPENDECTOMY  1985  . CARPAL TUNNEL RELEASE Right 02-05-2008  . CYSTOSCOPY W/ RETROGRADES  Bilateral 10/29/2014   Procedure: CYSTOSCOPY WITH RETROGRADE PYELOGRAM;  Surgeon: Alexis Frock, MD;  Location: [facility];  Service: Urology;  Laterality: Bilateral;  . CYSTOSCOPY W/ RETROGRADES Bilateral 09/10/2015   Procedure: CYSTOSCOPY WITH RETROGRADE PYELOGRAM;  Surgeon: Alexis Frock, MD;  [facility];  Service: Urology;  Laterality: Bilateral;  . CYSTOSCOPY WITH BIOPSY N/A 05/26/2014   Procedure: CYSTOSCOPY WITH BIOPSY;  Surgeon: Sharyn Creamer, MD;  Location: [facility];  Service: Urology;  Laterality: N/A;  . CYSTOSCOPY WITH RETROGRADE PYELOGRAM, URETEROSCOPY AND STENT PLACEMENT Bilateral 04/09/2014   Procedure: CYSTOSCOPY WITH RETROGRADE PYELOGRAM, POSSIBLE URETEROSCOPY WITH BIOPSY AND STENT PLACEMENT;  Surgeon: Sharyn Creamer, MD;  Location: [facility];  Service: Urology;  Laterality: Bilateral;  . KNEE LIGAMENT RECONSTRUCTION Right 1970  . RIGHT SHOULDER SURGERY  2011  . TONSILLECTOMY AND ADENOIDECTOMY  CHILD  . TRANSURETHRAL RESECTION OF BLADDER TUMOR WITH GYRUS (TURBT-GYRUS) Bilateral 04/09/2014   Procedure: TRANSURETHRAL RESECTION OF BLADDER TUMOR WITH GYRUS (TURBT-GYRUS);  Surgeon: Sharyn Creamer, MD;  Location: [facility];  Service: Urology;  Laterality: Bilateral;  . TRANSURETHRAL RESECTION OF BLADDER TUMOR WITH GYRUS (TURBT-GYRUS) N/A 10/29/2014   Procedure: TRANSURETHRAL RESECTION OF BLADDER TUMOR WITH GYRUS (TURBT-GYRUS);  Surgeon: Alexis Frock, MD;  Location: [facility];  Service: Urology;  Laterality: N/A;  . TRANSURETHRAL RESECTION OF BLADDER TUMOR WITH GYRUS (TURBT-GYRUS) N/A 09/10/2015  Procedure: TRANSURETHRAL RESECTION OF BLADDER TUMOR ;  Surgeon: Alexis Frock, MD;  Location: [facility];  Service: Urology;  Laterality: N/A;  . TRANSURETHRAL RESECTION OF PROSTATE  2006   AND    POST CAURTERIZATION OF BLEEDERS  . TRANSURETHRAL RESECTION OF  PROSTATE N/A 09/10/2015   Procedure: TRANSURETHRAL RESECTION OF THE PROSTATE ;  Surgeon: Alexis Frock, MD;  Location: [facility];  Service: Urology;  Laterality: N/A;       Home Medications    Prior to Admission medications   Medication Sig Start Date End Date Taking? Authorizing Provider  allopurinol (ZYLOPRIM) 100 MG tablet Take 100 mg by mouth daily.    Yes Historical Provider, MD  benzonatate (TESSALON) 100 MG capsule Take 100 mg by mouth 3 (three) times daily as needed for cough.   Yes Historical Provider, MD  Calcium Carb-Cholecalciferol (CALCIUM 500 +D) 500-400 MG-UNIT TABS Take 1 tablet by mouth daily.   Yes Historical Provider, MD  Coenzyme Q10 (COQ10) 100 MG CAPS Take 100 mg by mouth daily.   Yes Historical Provider, MD  ferrous sulfate 325 (65 FE) MG tablet Take 325 mg by mouth 2 (two) times daily with a meal.   Yes Historical Provider, MD  furosemide (LASIX) 80 MG tablet Take 80 mg by mouth 2 (two) times daily.   Yes Historical Provider, MD  guaiFENesin (MUCINEX) 600 MG 12 hr tablet Take 600 mg by mouth 2 (two) times daily as needed for cough or to loosen phlegm.   Yes Historical Provider, MD  guaifenesin (ROBITUSSIN) 100 MG/5ML syrup Take 200 mg by mouth every 6 (six) hours as needed for cough.   Yes Historical Provider, MD  ipratropium-albuterol (DUONEB) 0.5-2.5 (3) MG/3ML SOLN Take 3 mLs by nebulization every 6 (six) hours as needed (for wheezing/shortness of breath).   Yes Historical Provider, MD  levofloxacin (LEVAQUIN) 500 MG tablet Take 500 mg by mouth daily. 11/13/16 11/20/16 Yes Historical Provider, MD  losartan (COZAAR) 100 MG tablet Take 100 mg by mouth daily.    Yes Historical Provider, MD  metoprolol succinate (TOPROL-XL) 25 MG 24 hr tablet Take 25 mg by mouth daily.   Yes Historical Provider, MD  omega-3 acid ethyl esters (LOVAZA) 1 g capsule Take 1 g by mouth 2 (two) times daily.   Yes Historical Provider, MD  omeprazole (PRILOSEC) 20 MG capsule  Take 20 mg by mouth daily.    Yes Historical Provider, MD  potassium chloride SA (K-DUR,KLOR-CON) 20 MEQ tablet Take 20 mEq by mouth daily.   Yes Historical Provider, MD  pravastatin (PRAVACHOL) 80 MG tablet Take 80 mg by mouth daily.    Yes Historical Provider, MD  zolpidem (AMBIEN) 5 MG tablet Take 5 mg by mouth at bedtime as needed for sleep.   Yes Historical Provider, MD    Family History No family history on file.  Social History Social History  Substance Use Topics  . Smoking status: Former Smoker    Packs/day: 1.50    Years: 35.00    Types: Cigarettes  . Smokeless tobacco: Never Used  . Alcohol use 3.2 oz/week    2 Cans of beer, 4 Standard drinks or equivalent per week     Allergies   Bee venom and Penicillins   Review of Systems Review of Systems  All other systems reviewed and are negative.    Physical Exam Updated Vital Signs BP 116/60 (BP Location: Left Arm)   Pulse 89   Temp 98.3 F (36.8 C) (  Oral)   Resp 22   Ht '6\' 4"'$  (1.93 m)   Wt (!) 154.9 kg   SpO2 (!) 85%   BMI 41.58 kg/m   Physical Exam  Constitutional: No distress.  Morbidly obese  HENT:  Head: Normocephalic and atraumatic.  Right Ear: External ear normal.  Left Ear: External ear normal.  Eyes: Conjunctivae are normal. Right eye exhibits no discharge. Left eye exhibits no discharge. No scleral icterus.  Neck: Neck supple. No tracheal deviation present.  Cardiovascular: Normal rate, regular rhythm and intact distal pulses.   Pulmonary/Chest: Effort normal and breath sounds normal. No stridor. No respiratory distress. He has no wheezes. He has no rales.  Abdominal: Soft. Bowel sounds are normal. He exhibits no distension. There is no tenderness. There is no rebound and no guarding.  Musculoskeletal: He exhibits edema (pitting edema bilaterally lower extremities). He exhibits no tenderness.  Neurological: He is alert. He has normal strength. No cranial nerve deficit (no facial droop,  extraocular movements intact, no slurred speech) or sensory deficit. He exhibits normal muscle tone. He displays no seizure activity. Coordination normal.  Skin: Skin is warm. No rash noted. He is not diaphoretic.  Psychiatric: He has a normal mood and affect.  Nursing note and vitals reviewed.    ED Treatments / Results  Labs (all labs ordered are listed, but only abnormal results are displayed) Labs Reviewed  BASIC METABOLIC PANEL - Abnormal; Notable for the following:       Result Value   Sodium 133 (*)    Chloride 88 (*)    CO2 38 (*)    Glucose, Bld 115 (*)    All other components within normal limits  CBC - Abnormal; Notable for the following:    RBC 3.97 (*)    Hemoglobin 9.7 (*)    HCT 32.4 (*)    MCH 24.4 (*)    MCHC 29.9 (*)    RDW 23.9 (*)    Platelets 135 (*)    All other components within normal limits  TROPONIN I - Abnormal; Notable for the following:    Troponin I 0.07 (*)    All other components within normal limits  BRAIN NATRIURETIC PEPTIDE - Abnormal; Notable for the following:    B Natriuretic Peptide 639.1 (*)    All other components within normal limits  PROTIME-INR - Abnormal; Notable for the following:    Prothrombin Time 16.4 (*)    All other components within normal limits  URINALYSIS, ROUTINE W REFLEX MICROSCOPIC - Abnormal; Notable for the following:    Color, Urine RED (*)    APPearance CLOUDY (*)    Hgb urine dipstick LARGE (*)    Protein, ur 100 (*)    Leukocytes, UA TRACE (*)    All other components within normal limits  CBC - Abnormal; Notable for the following:    RBC 4.12 (*)    Hemoglobin 10.1 (*)    HCT 33.7 (*)    MCH 24.5 (*)    RDW 23.9 (*)    Platelets 133 (*)    All other components within normal limits  TYPE AND SCREEN  ABO/RH    EKG  EKG Interpretation  Date/Time:  Tuesday November 15 2016 19:47:06 EST Ventricular Rate:  85 PR Interval:  184 QRS Duration: 106 QT Interval:  380 QTC Calculation: 452 R  Axis:   -57 Text Interpretation:  Sinus rhythm with Premature supraventricular complexes Incomplete right bundle branch block Left anterior fascicular block Abnormal ECG  No significant change since last tracing Confirmed by [facility]  MD-J, Lorita Forinash [id number]) on 11/15/2016 7:06:05 PM       Radiology Dg Chest Port 1 View  Result Date: 11/15/2016 CLINICAL DATA:  Recurrent rectal bleeding today. Discharged from another hospital yesterday. EXAM: PORTABLE CHEST 1 VIEW COMPARISON:  11/08/2016 FINDINGS: There is unchanged marked cardiomegaly. Mild diffuse vascular and interstitial prominence persists without significant interval change. Probable pleural effusions, particularly on the right. Mild medial base airspace opacities. IMPRESSION: The findings likely represent congestive heart failure. Electronically Signed   By: Andreas Newport M.D.   On: 11/15/2016 18:14    Procedures Procedures (including critical care time)  Medications Ordered in ED Medications - No data to display   Initial Impression / Assessment and Plan / ED Course  I have reviewed the triage vital signs and the nursing notes.  Pertinent labs & imaging results that were available during my care of the patient were reviewed by me and considered in my medical decision making (see chart for details).  Clinical Course as of Nov 15 2140  Tue Nov 15, 2016  1946 Hgb 7.6 on 12/15 at [facility] CXR suggests CHF.  Pt was recently admitted for same.  Troponin was 0.21 on 12/12 at [facility].  Decreased today in [place] BNP on 12/12 was 2600  [JK]    Clinical Course User Index [JK] Dorie Rank, MD   Patient presented to the emergency room for evaluation of hematuria. Patient had a complex recent medical history. He was admitted to the hospital at [facility] where he was treated for anemia as well as congestive heart failure. Patient states from a respiratory standpoint is feeling better. He feels at his  baseline. His laboratory tests show that his hemoglobin is improving since his hospitalization. He does to continue to have hematuria but he is going to see the urologist this Thursday.  I'm concerned concerning his congestive heart failure. Patient is laboratory tests do show that his BNP and troponin are declining but he still has a component of CHF. Patient ambulated on oxygen in the emergency room and his oxygen saturation dropped into the mid 80s. I recommended admission to the hospital. Patient does not want to be admitted. He states this is normal for him. He states when he was in the hospital his oxygen saturation was decreasing to the 80s. As soon as he rests it always goes back up. I explained my concern to the patient as well as family members. Patient clearly states that he does not want to be admitted and he will follow-up with his cardiologist. He understands he can return at any time.  He wants to take his evening dose of diuretics at home because it will make him urinate and he has to drive back to his home  Final Clinical Impressions(s) / ED Diagnoses   Final diagnoses:  Hematuria, unspecified type  Chronic congestive heart failure, unspecified congestive heart failure type [facility])    New Prescriptions New Prescriptions   No medications on file     Dorie Rank, MD 11/15/16 2141

## 2016-11-15 NOTE — ED Notes (Signed)
Patient request lab draw with IV start. RN made aware.

## 2016-11-15 NOTE — ED Notes (Signed)
Pt ambulated with assistance by RN & walker, pt remains on oxygen @ 3L.  Oxygen dropped to 85 % during ambulation with oxygen, back to 95% post ambulation.  Pt placed back on monitor.

## 2016-11-15 NOTE — Discharge Instructions (Signed)
Continue your medications.  Follow up with your cardiologist.  Follow up with your urologist on Thursday as planned

## 2016-11-16 DIAGNOSIS — E662 Morbid (severe) obesity with alveolar hypoventilation: Secondary | ICD-10-CM | POA: Diagnosis not present

## 2016-11-16 DIAGNOSIS — I130 Hypertensive heart and chronic kidney disease with heart failure and stage 1 through stage 4 chronic kidney disease, or unspecified chronic kidney disease: Secondary | ICD-10-CM | POA: Diagnosis not present

## 2016-11-16 DIAGNOSIS — I5033 Acute on chronic diastolic (congestive) heart failure: Secondary | ICD-10-CM | POA: Diagnosis not present

## 2016-11-16 DIAGNOSIS — Z9981 Dependence on supplemental oxygen: Secondary | ICD-10-CM | POA: Diagnosis not present

## 2016-11-16 DIAGNOSIS — I872 Venous insufficiency (chronic) (peripheral): Secondary | ICD-10-CM | POA: Diagnosis not present

## 2016-11-16 DIAGNOSIS — C679 Malignant neoplasm of bladder, unspecified: Secondary | ICD-10-CM | POA: Diagnosis not present

## 2016-11-16 DIAGNOSIS — G4733 Obstructive sleep apnea (adult) (pediatric): Secondary | ICD-10-CM | POA: Diagnosis not present

## 2016-11-16 DIAGNOSIS — Z6841 Body Mass Index (BMI) 40.0 and over, adult: Secondary | ICD-10-CM | POA: Diagnosis not present

## 2016-11-16 DIAGNOSIS — Z87891 Personal history of nicotine dependence: Secondary | ICD-10-CM | POA: Diagnosis not present

## 2016-11-17 DIAGNOSIS — N289 Disorder of kidney and ureter, unspecified: Secondary | ICD-10-CM | POA: Diagnosis not present

## 2016-11-17 DIAGNOSIS — N200 Calculus of kidney: Secondary | ICD-10-CM | POA: Diagnosis not present

## 2016-11-17 DIAGNOSIS — C651 Malignant neoplasm of right renal pelvis: Secondary | ICD-10-CM | POA: Diagnosis not present

## 2016-11-17 DIAGNOSIS — C672 Malignant neoplasm of lateral wall of bladder: Secondary | ICD-10-CM | POA: Diagnosis not present

## 2016-11-17 DIAGNOSIS — R310 Gross hematuria: Secondary | ICD-10-CM | POA: Diagnosis not present

## 2016-11-17 LAB — TYPE AND SCREEN
ABO/RH(D): A POS
Antibody Screen: NEGATIVE

## 2016-11-18 DIAGNOSIS — D649 Anemia, unspecified: Secondary | ICD-10-CM | POA: Diagnosis not present

## 2016-11-22 DIAGNOSIS — I872 Venous insufficiency (chronic) (peripheral): Secondary | ICD-10-CM | POA: Diagnosis not present

## 2016-11-22 DIAGNOSIS — I5033 Acute on chronic diastolic (congestive) heart failure: Secondary | ICD-10-CM | POA: Diagnosis not present

## 2016-11-22 DIAGNOSIS — I130 Hypertensive heart and chronic kidney disease with heart failure and stage 1 through stage 4 chronic kidney disease, or unspecified chronic kidney disease: Secondary | ICD-10-CM | POA: Diagnosis not present

## 2016-11-22 DIAGNOSIS — G4733 Obstructive sleep apnea (adult) (pediatric): Secondary | ICD-10-CM | POA: Diagnosis not present

## 2016-11-22 DIAGNOSIS — E662 Morbid (severe) obesity with alveolar hypoventilation: Secondary | ICD-10-CM | POA: Diagnosis not present

## 2016-11-22 DIAGNOSIS — C679 Malignant neoplasm of bladder, unspecified: Secondary | ICD-10-CM | POA: Diagnosis not present

## 2016-11-23 DIAGNOSIS — D649 Anemia, unspecified: Secondary | ICD-10-CM | POA: Diagnosis not present

## 2016-11-24 ENCOUNTER — Other Ambulatory Visit: Payer: Self-pay | Admitting: Urology

## 2016-11-24 DIAGNOSIS — D509 Iron deficiency anemia, unspecified: Secondary | ICD-10-CM | POA: Diagnosis not present

## 2016-11-24 DIAGNOSIS — I130 Hypertensive heart and chronic kidney disease with heart failure and stage 1 through stage 4 chronic kidney disease, or unspecified chronic kidney disease: Secondary | ICD-10-CM | POA: Diagnosis not present

## 2016-11-24 DIAGNOSIS — I872 Venous insufficiency (chronic) (peripheral): Secondary | ICD-10-CM | POA: Diagnosis not present

## 2016-11-24 DIAGNOSIS — G4733 Obstructive sleep apnea (adult) (pediatric): Secondary | ICD-10-CM | POA: Diagnosis not present

## 2016-11-24 DIAGNOSIS — E662 Morbid (severe) obesity with alveolar hypoventilation: Secondary | ICD-10-CM | POA: Diagnosis not present

## 2016-11-24 DIAGNOSIS — I5033 Acute on chronic diastolic (congestive) heart failure: Secondary | ICD-10-CM | POA: Diagnosis not present

## 2016-11-24 DIAGNOSIS — C679 Malignant neoplasm of bladder, unspecified: Secondary | ICD-10-CM | POA: Diagnosis not present

## 2016-11-25 DIAGNOSIS — I5033 Acute on chronic diastolic (congestive) heart failure: Secondary | ICD-10-CM | POA: Diagnosis not present

## 2016-11-25 DIAGNOSIS — I130 Hypertensive heart and chronic kidney disease with heart failure and stage 1 through stage 4 chronic kidney disease, or unspecified chronic kidney disease: Secondary | ICD-10-CM | POA: Diagnosis not present

## 2016-11-25 DIAGNOSIS — E662 Morbid (severe) obesity with alveolar hypoventilation: Secondary | ICD-10-CM | POA: Diagnosis not present

## 2016-11-25 DIAGNOSIS — G4733 Obstructive sleep apnea (adult) (pediatric): Secondary | ICD-10-CM | POA: Diagnosis not present

## 2016-11-25 DIAGNOSIS — C679 Malignant neoplasm of bladder, unspecified: Secondary | ICD-10-CM | POA: Diagnosis not present

## 2016-11-25 DIAGNOSIS — I872 Venous insufficiency (chronic) (peripheral): Secondary | ICD-10-CM | POA: Diagnosis not present

## 2016-11-29 DIAGNOSIS — D649 Anemia, unspecified: Secondary | ICD-10-CM | POA: Diagnosis not present

## 2016-11-30 DIAGNOSIS — I130 Hypertensive heart and chronic kidney disease with heart failure and stage 1 through stage 4 chronic kidney disease, or unspecified chronic kidney disease: Secondary | ICD-10-CM | POA: Diagnosis not present

## 2016-11-30 DIAGNOSIS — C679 Malignant neoplasm of bladder, unspecified: Secondary | ICD-10-CM | POA: Diagnosis not present

## 2016-11-30 DIAGNOSIS — I872 Venous insufficiency (chronic) (peripheral): Secondary | ICD-10-CM | POA: Diagnosis not present

## 2016-11-30 DIAGNOSIS — E662 Morbid (severe) obesity with alveolar hypoventilation: Secondary | ICD-10-CM | POA: Diagnosis not present

## 2016-11-30 DIAGNOSIS — I5033 Acute on chronic diastolic (congestive) heart failure: Secondary | ICD-10-CM | POA: Diagnosis not present

## 2016-11-30 DIAGNOSIS — G4733 Obstructive sleep apnea (adult) (pediatric): Secondary | ICD-10-CM | POA: Diagnosis not present

## 2016-12-02 DIAGNOSIS — I130 Hypertensive heart and chronic kidney disease with heart failure and stage 1 through stage 4 chronic kidney disease, or unspecified chronic kidney disease: Secondary | ICD-10-CM | POA: Diagnosis not present

## 2016-12-02 DIAGNOSIS — I872 Venous insufficiency (chronic) (peripheral): Secondary | ICD-10-CM | POA: Diagnosis not present

## 2016-12-02 DIAGNOSIS — E662 Morbid (severe) obesity with alveolar hypoventilation: Secondary | ICD-10-CM | POA: Diagnosis not present

## 2016-12-02 DIAGNOSIS — I5033 Acute on chronic diastolic (congestive) heart failure: Secondary | ICD-10-CM | POA: Diagnosis not present

## 2016-12-02 DIAGNOSIS — C679 Malignant neoplasm of bladder, unspecified: Secondary | ICD-10-CM | POA: Diagnosis not present

## 2016-12-02 DIAGNOSIS — G4733 Obstructive sleep apnea (adult) (pediatric): Secondary | ICD-10-CM | POA: Diagnosis not present

## 2016-12-07 DIAGNOSIS — D509 Iron deficiency anemia, unspecified: Secondary | ICD-10-CM | POA: Diagnosis not present

## 2016-12-07 DIAGNOSIS — G4733 Obstructive sleep apnea (adult) (pediatric): Secondary | ICD-10-CM | POA: Diagnosis not present

## 2016-12-07 DIAGNOSIS — I5033 Acute on chronic diastolic (congestive) heart failure: Secondary | ICD-10-CM | POA: Diagnosis not present

## 2016-12-07 DIAGNOSIS — I130 Hypertensive heart and chronic kidney disease with heart failure and stage 1 through stage 4 chronic kidney disease, or unspecified chronic kidney disease: Secondary | ICD-10-CM | POA: Diagnosis not present

## 2016-12-07 DIAGNOSIS — I872 Venous insufficiency (chronic) (peripheral): Secondary | ICD-10-CM | POA: Diagnosis not present

## 2016-12-07 DIAGNOSIS — C679 Malignant neoplasm of bladder, unspecified: Secondary | ICD-10-CM | POA: Diagnosis not present

## 2016-12-07 DIAGNOSIS — E662 Morbid (severe) obesity with alveolar hypoventilation: Secondary | ICD-10-CM | POA: Diagnosis not present

## 2016-12-08 NOTE — Progress Notes (Signed)
LOV DR Arn Medal 05-30-16 on chart lov pulmonary dr Gwenyth Allegra 09-26-16 on chart

## 2016-12-08 NOTE — Patient Instructions (Addendum)
Victor Wang  12/08/2016   Your procedure is scheduled on: 12-14-16  Report to [facility]  Entrance take [facility]  elevators to 3rd floor to  [place] at 1115 AM.  Call this number if you have problems the morning of surgery [phone]   Remember: ONLY 1 PERSON MAY GO WITH YOU TO SHORT STAY TO GET  READY MORNING OF B and E.  Do not eat food :After Midnight, CLEAR LIQUIDS FROM MIDNIGHT NIGHT BEFORE SURGERY UNTIL 715 AM DAY OF SURGERY, NOTHING BY MOUTH AFTRE 715 AM DAY OF SURGERY.   Bring cpap mask and tubing  Take these medicines the morning of surgery with A SIP OF WATER: ALLOPURINOL, DUONEB NEBULIZER IF NEEDED, METOPROLOL, PRAVACHOL (PRAVASTATIN)                                You may not have any metal on your body including hair pins and              piercings  Do not wear jewelry, make-up, lotions, powders or perfumes, deodorant             Do not wear nail polish.  Do not shave  48 hours prior to surgery.              Men may shave face and neck.   Do not bring valuables to the hospital. [place].  Contacts, dentures or bridgework may not be worn into surgery.  Leave suitcase in the car. After surgery it may be brought to your room.     Patients discharged the day of surgery will not be allowed to drive home.  Name and phone number of your driver: wife peggy [phone]  Special Instructions: N/A              Please read over the following fact sheets you were given: _____________________________________________________________________                CLEAR LIQUID DIET   Foods Allowed                                                                     Foods Excluded  Coffee and tea, regular and decaf                             liquids that you cannot  Plain Jell-O in any flavor                                             see through such as: Fruit ices (not with fruit  pulp)                                     milk, soups, orange  juice  Iced Popsicles                                    All solid food Carbonated beverages, regular and diet                                    Cranberry, grape and apple juices Sports drinks like Gatorade Lightly seasoned clear broth or consume(fat free) Sugar, honey syrup  Sample Menu Breakfast                                Lunch                                     Supper Cranberry juice                    Beef broth                            Chicken broth Jell-O                                     Grape juice                           Apple juice Coffee or tea                        Jell-O                                      Popsicle                                                Coffee or tea                        Coffee or tea  _____________________________________________________________________  [facility] - Preparing for Surgery Before surgery, you can play an important role.  Because skin is not sterile, your skin needs to be as free of germs as possible.  You can reduce the number of germs on your skin by washing with CHG (chlorahexidine gluconate) soap before surgery.  CHG is an antiseptic cleaner which kills germs and bonds with the skin to continue killing germs even after washing. Please DO NOT use if you have an allergy to CHG or antibacterial soaps.  If your skin becomes reddened/irritated stop using the CHG and inform your nurse when you arrive at Short Stay. Do not shave (including legs and underarms) for at least 48 hours prior to the first CHG shower.  You may shave your face/neck. Please follow these instructions carefully:  1.  Shower with CHG Soap the night before surgery and the  morning of Surgery.  2.  If you choose to wash your hair, wash  your hair first as usual with your  normal  shampoo.  3.  After you shampoo, rinse your hair and body thoroughly to remove the  shampoo.                            4.  Use CHG as you would any other liquid soap.  You can apply chg directly  to the skin and wash                       Gently with a scrungie or clean washcloth.  5.  Apply the CHG Soap to your body ONLY FROM THE NECK DOWN.   Do not use on face/ open                           Wound or open sores. Avoid contact with eyes, ears mouth and genitals (private parts).                       Wash face,  Genitals (private parts) with your normal soap.             6.  Wash thoroughly, paying special attention to the area where your surgery  will be performed.  7.  Thoroughly rinse your body with warm water from the neck down.  8.  DO NOT shower/wash with your normal soap after using and rinsing off  the CHG Soap.                9.  Pat yourself dry with a clean towel.            10.  Wear clean pajamas.            11.  Place clean sheets on your bed the night of your first shower and do not  sleep with pets. Day of Surgery : Do not apply any lotions/deodorants the morning of surgery.  Please wear clean clothes to the hospital/surgery center.  FAILURE TO FOLLOW THESE INSTRUCTIONS MAY RESULT IN THE CANCELLATION OF YOUR SURGERY PATIENT SIGNATURE_________________________________  NURSE SIGNATURE__________________________________  ________________________________________________________________________

## 2016-12-12 ENCOUNTER — Encounter (HOSPITAL_COMMUNITY): Payer: Self-pay

## 2016-12-12 ENCOUNTER — Encounter (HOSPITAL_COMMUNITY)
Admission: RE | Admit: 2016-12-12 | Discharge: 2016-12-12 | Disposition: A | Discharge status: Home / Self Care | Payer: Medicare Other | Source: Ambulatory Visit | Attending: Urology | Admitting: Urology

## 2016-12-12 DIAGNOSIS — Z01812 Encounter for preprocedural laboratory examination: Secondary | ICD-10-CM | POA: Insufficient documentation

## 2016-12-12 DIAGNOSIS — E785 Hyperlipidemia, unspecified: Secondary | ICD-10-CM | POA: Diagnosis not present

## 2016-12-12 DIAGNOSIS — I10 Essential (primary) hypertension: Secondary | ICD-10-CM | POA: Insufficient documentation

## 2016-12-12 HISTORY — DX: Other specified disorders of kidney and ureter: N28.89

## 2016-12-12 HISTORY — DX: Pulmonary hypertension, unspecified: I27.20

## 2016-12-12 HISTORY — DX: Anemia, unspecified: D64.9

## 2016-12-12 HISTORY — DX: Personal history of other medical treatment: Z92.89

## 2016-12-12 HISTORY — DX: Other complications of anesthesia, initial encounter: T88.59XA

## 2016-12-12 HISTORY — DX: Chronic obstructive pulmonary disease, unspecified: J44.9

## 2016-12-12 HISTORY — DX: Pneumonia, unspecified organism: J18.9

## 2016-12-12 HISTORY — DX: Adverse effect of unspecified anesthetic, initial encounter: T41.45XA

## 2016-12-12 HISTORY — DX: Acute embolism and thrombosis of unspecified vein: I82.90

## 2016-12-12 HISTORY — DX: Sleep apnea, unspecified: G47.30

## 2016-12-12 HISTORY — DX: Heart failure, unspecified: I50.9

## 2016-12-12 HISTORY — DX: Unspecified nephritic syndrome with unspecified morphologic changes: N05.9

## 2016-12-12 LAB — BASIC METABOLIC PANEL
Anion gap: 6 (ref 5–15)
BUN: 15 mg/dL (ref 6–20)
CHLORIDE: 97 mmol/L — AB (ref 101–111)
CO2: 29 mmol/L (ref 22–32)
CREATININE: 0.76 mg/dL (ref 0.61–1.24)
Calcium: 9 mg/dL (ref 8.9–10.3)
GFR calc Af Amer: >60 mL/min (ref >60)
GFR calc non Af Amer: >60 mL/min (ref >60)
Glucose, Bld: 139 mg/dL — ABNORMAL HIGH (ref 65–99)
Potassium: 4.2 mmol/L (ref 3.5–5.1)
SODIUM: 132 mmol/L — AB (ref 135–145)

## 2016-12-12 LAB — CBC
HCT: 23.8 % — ABNORMAL LOW (ref 39.0–52.0)
Hemoglobin: 7.7 g/dL — ABNORMAL LOW (ref 13.0–17.0)
MCH: 29.4 pg (ref 26.0–34.0)
MCHC: 32.4 g/dL (ref 30.0–36.0)
MCV: 90.8 fL (ref 78.0–100.0)
PLATELETS: 137 10*3/uL — AB (ref 150–400)
RBC: 2.62 MIL/uL — ABNORMAL LOW (ref 4.22–5.81)
RDW: 24.9 % — AB (ref 11.5–15.5)
WBC: 3.8 10*3/uL — AB (ref 4.0–10.5)

## 2016-12-12 NOTE — Progress Notes (Signed)
Echo 12-15-15 [place] [facility] with mild pulmonary hypertension on chart

## 2016-12-12 NOTE — Progress Notes (Addendum)
lov primary care 11-23-16 dr Nona Dell on chart Spoke with dr dr Smith [facility] anesthesia need information where pulmonary hypertension was diagnosed, requested echo 12-15-15 [facility] where pulmonary hypertension was diagnosed and patient has kept all pulmary appoint with dr Barbaraann Cao. Ok to use 1219-17 chest xray and 11-15-16 ekg  per dr Smith [facility] anesthesia.

## 2016-12-13 MED ORDER — BELLADONNA ALKALOIDS-OPIUM 16.2-60 MG RE SUPP
RECTAL | Status: AC
  Filled 2016-12-13: qty 1

## 2016-12-14 ENCOUNTER — Encounter (HOSPITAL_COMMUNITY): Admission: RE | Payer: Self-pay | Source: Ambulatory Visit

## 2016-12-14 ENCOUNTER — Ambulatory Visit (HOSPITAL_COMMUNITY): Admission: RE | Admit: 2016-12-14 | Payer: Medicare Other | Source: Ambulatory Visit | Admitting: Urology

## 2016-12-14 ENCOUNTER — Encounter (HOSPITAL_COMMUNITY): Payer: Self-pay | Admitting: Certified Registered Nurse Anesthetist

## 2016-12-14 SURGERY — CYSTOURETEROSCOPY, USING HOLMIUM LASER
Anesthesia: Choice | Laterality: Right

## 2016-12-14 NOTE — Discharge Instructions (Signed)
1 - You may have urinary urgency (bladder spasms) and bloody urine on / off with stent in place. This is normal. ° °2 - Call MD or go to ER for fever >102, severe pain / nausea / vomiting not relieved by medications, or acute change in medical status ° °

## 2016-12-19 ENCOUNTER — Other Ambulatory Visit: Payer: Self-pay | Admitting: Urology

## 2016-12-19 NOTE — Progress Notes (Signed)
Spoke with wife - Vollie Brunty and made aware of new surgery date of 12/28/2016 , surgery time 0730am-0845am.  And patient to arrive at 0530am.  Also nothing to eat or drink after midnite nite before surgery and to follow same medication instructions with a sip of water.

## 2016-12-21 DIAGNOSIS — I130 Hypertensive heart and chronic kidney disease with heart failure and stage 1 through stage 4 chronic kidney disease, or unspecified chronic kidney disease: Secondary | ICD-10-CM | POA: Diagnosis not present

## 2016-12-21 DIAGNOSIS — I872 Venous insufficiency (chronic) (peripheral): Secondary | ICD-10-CM | POA: Diagnosis not present

## 2016-12-21 DIAGNOSIS — C679 Malignant neoplasm of bladder, unspecified: Secondary | ICD-10-CM | POA: Diagnosis not present

## 2016-12-21 DIAGNOSIS — E662 Morbid (severe) obesity with alveolar hypoventilation: Secondary | ICD-10-CM | POA: Diagnosis not present

## 2016-12-21 DIAGNOSIS — I5033 Acute on chronic diastolic (congestive) heart failure: Secondary | ICD-10-CM | POA: Diagnosis not present

## 2016-12-21 DIAGNOSIS — G4733 Obstructive sleep apnea (adult) (pediatric): Secondary | ICD-10-CM | POA: Diagnosis not present

## 2016-12-27 NOTE — Anesthesia Preprocedure Evaluation (Addendum)
Anesthesia Evaluation  Patient identified by MRN, date of birth, ID band Patient awake    Reviewed: Allergy & Precautions, H&P , NPO status , Patient's Chart, lab work & pertinent test results  History of Anesthesia Complications (+) PONVNegative for: history of anesthetic complications  Airway Mallampati: II  TM Distance: >3 FB Neck ROM: Full    Dental no notable dental hx. (+) Edentulous Lower, Edentulous Upper, Dental Advisory Given   Pulmonary sleep apnea , COPD, former smoker,  Positive cough   Pulmonary exam normal        Cardiovascular hypertension, Pt. on medications and Pt. on home beta blockers Normal cardiovascular exam     Neuro/Psych negative psych ROS   GI/Hepatic Neg liver ROS, GERD  Medicated and Controlled,  Endo/Other  Morbid obesity  Renal/GU  Bladder dysfunction      Musculoskeletal  (+) Arthritis , Osteoarthritis,    Abdominal (+) + obese,   Peds negative pediatric ROS (+)  Hematology negative hematology ROS (+)   Anesthesia Other Findings   Reproductive/Obstetrics negative OB ROS                            Anesthesia Physical  Anesthesia Plan  ASA: III  Anesthesia Plan: General   Post-op Pain Management:    Induction: Intravenous  Airway Management Planned: LMA  Additional Equipment:   Intra-op Plan:   Post-operative Plan: Extubation in OR  Informed Consent: I have reviewed the patients History and Physical, chart, labs and discussed the procedure including the risks, benefits and alternatives for the proposed anesthesia with the patient or authorized representative who has indicated his/her understanding and acceptance.   Dental advisory given  Plan Discussed with: CRNA and Anesthesiologist  Anesthesia Plan Comments:        Anesthesia Quick Evaluation

## 2016-12-28 ENCOUNTER — Ambulatory Visit (HOSPITAL_COMMUNITY)
Admission: RE | Admit: 2016-12-28 | Discharge: 2016-12-28 | Disposition: A | Discharge status: Home / Self Care | Payer: Medicare Other | Source: Ambulatory Visit | Attending: Urology | Admitting: Urology

## 2016-12-28 ENCOUNTER — Encounter (HOSPITAL_COMMUNITY)
Admission: RE | Disposition: A | Discharge status: Home / Self Care | Payer: Self-pay | Source: Ambulatory Visit | Attending: Urology

## 2016-12-28 ENCOUNTER — Ambulatory Visit (HOSPITAL_COMMUNITY): Payer: Medicare Other | Admitting: Anesthesiology

## 2016-12-28 ENCOUNTER — Encounter (HOSPITAL_COMMUNITY): Payer: Self-pay | Admitting: *Deleted

## 2016-12-28 DIAGNOSIS — Z87891 Personal history of nicotine dependence: Secondary | ICD-10-CM | POA: Insufficient documentation

## 2016-12-28 DIAGNOSIS — K219 Gastro-esophageal reflux disease without esophagitis: Secondary | ICD-10-CM | POA: Insufficient documentation

## 2016-12-28 DIAGNOSIS — I509 Heart failure, unspecified: Secondary | ICD-10-CM | POA: Insufficient documentation

## 2016-12-28 DIAGNOSIS — C651 Malignant neoplasm of right renal pelvis: Secondary | ICD-10-CM | POA: Insufficient documentation

## 2016-12-28 DIAGNOSIS — I130 Hypertensive heart and chronic kidney disease with heart failure and stage 1 through stage 4 chronic kidney disease, or unspecified chronic kidney disease: Secondary | ICD-10-CM | POA: Diagnosis not present

## 2016-12-28 DIAGNOSIS — Z8551 Personal history of malignant neoplasm of bladder: Secondary | ICD-10-CM | POA: Insufficient documentation

## 2016-12-28 DIAGNOSIS — N189 Chronic kidney disease, unspecified: Secondary | ICD-10-CM | POA: Diagnosis not present

## 2016-12-28 DIAGNOSIS — I10 Essential (primary) hypertension: Secondary | ICD-10-CM | POA: Diagnosis not present

## 2016-12-28 DIAGNOSIS — E785 Hyperlipidemia, unspecified: Secondary | ICD-10-CM | POA: Insufficient documentation

## 2016-12-28 DIAGNOSIS — N2889 Other specified disorders of kidney and ureter: Secondary | ICD-10-CM | POA: Diagnosis not present

## 2016-12-28 DIAGNOSIS — I272 Pulmonary hypertension, unspecified: Secondary | ICD-10-CM | POA: Diagnosis not present

## 2016-12-28 DIAGNOSIS — N400 Enlarged prostate without lower urinary tract symptoms: Secondary | ICD-10-CM | POA: Insufficient documentation

## 2016-12-28 DIAGNOSIS — J449 Chronic obstructive pulmonary disease, unspecified: Secondary | ICD-10-CM | POA: Diagnosis not present

## 2016-12-28 DIAGNOSIS — N029 Recurrent and persistent hematuria with unspecified morphologic changes: Secondary | ICD-10-CM | POA: Insufficient documentation

## 2016-12-28 DIAGNOSIS — Z79899 Other long term (current) drug therapy: Secondary | ICD-10-CM | POA: Diagnosis not present

## 2016-12-28 DIAGNOSIS — R1900 Intra-abdominal and pelvic swelling, mass and lump, unspecified site: Secondary | ICD-10-CM | POA: Diagnosis present

## 2016-12-28 DIAGNOSIS — Z6841 Body Mass Index (BMI) 40.0 and over, adult: Secondary | ICD-10-CM | POA: Insufficient documentation

## 2016-12-28 DIAGNOSIS — C679 Malignant neoplasm of bladder, unspecified: Secondary | ICD-10-CM | POA: Diagnosis not present

## 2016-12-28 DIAGNOSIS — R319 Hematuria, unspecified: Secondary | ICD-10-CM | POA: Diagnosis not present

## 2016-12-28 HISTORY — PX: CYSTOSCOPY/RETROGRADE/URETEROSCOPY: SHX5316

## 2016-12-28 HISTORY — DX: Other specified postprocedural states: Z98.890

## 2016-12-28 HISTORY — DX: Other specified postprocedural states: R11.2

## 2016-12-28 HISTORY — DX: Dyspnea, unspecified: R06.00

## 2016-12-28 LAB — PREPARE RBC (CROSSMATCH)

## 2016-12-28 SURGERY — CYSTOSCOPY/RETROGRADE/URETEROSCOPY
Anesthesia: General | Site: Ureter | Laterality: Right

## 2016-12-28 MED ORDER — PROMETHAZINE HCL 25 MG/ML IJ SOLN: 6.2500 mg | INTRAMUSCULAR | Status: DC | PRN

## 2016-12-28 MED ORDER — IOHEXOL 300 MG/ML  SOLN
INTRAMUSCULAR | Status: DC | PRN
  Administered 2016-12-28: 50 mL

## 2016-12-28 MED ORDER — ONDANSETRON HCL 4 MG/2ML IJ SOLN
INTRAMUSCULAR | Status: DC | PRN
  Administered 2016-12-28: 4 mg via INTRAVENOUS

## 2016-12-28 MED ORDER — FENTANYL CITRATE (PF) 100 MCG/2ML IJ SOLN
INTRAMUSCULAR | Status: DC | PRN
  Administered 2016-12-28 (×2): 25 ug via INTRAVENOUS

## 2016-12-28 MED ORDER — ONDANSETRON HCL 4 MG/2ML IJ SOLN
INTRAMUSCULAR | Status: AC
  Filled 2016-12-28: qty 2

## 2016-12-28 MED ORDER — TRAMADOL HCL 50 MG PO TABS
50.0000 mg | ORAL_TABLET | Freq: Once | ORAL | Status: AC
  Administered 2016-12-28: 50 mg via ORAL
  Filled 2016-12-28: qty 1

## 2016-12-28 MED ORDER — DEXAMETHASONE SODIUM PHOSPHATE 10 MG/ML IJ SOLN
INTRAMUSCULAR | Status: DC | PRN
  Administered 2016-12-28: 10 mg via INTRAVENOUS

## 2016-12-28 MED ORDER — SODIUM CHLORIDE 0.9 % IV SOLN: Freq: Once | INTRAVENOUS | Status: DC

## 2016-12-28 MED ORDER — HYDROMORPHONE HCL 1 MG/ML IJ SOLN
INTRAMUSCULAR | Status: AC
  Filled 2016-12-28: qty 1

## 2016-12-28 MED ORDER — DEXAMETHASONE SODIUM PHOSPHATE 10 MG/ML IJ SOLN
INTRAMUSCULAR | Status: AC
  Filled 2016-12-28: qty 1

## 2016-12-28 MED ORDER — PROPOFOL 10 MG/ML IV BOLUS
INTRAVENOUS | Status: DC | PRN
  Administered 2016-12-28: 200 mg via INTRAVENOUS

## 2016-12-28 MED ORDER — LIDOCAINE 2% (20 MG/ML) 5 ML SYRINGE
INTRAMUSCULAR | Status: DC | PRN
Start: 2016-12-28 — End: 2016-12-28
  Administered 2016-12-28: 100 mg via INTRAVENOUS

## 2016-12-28 MED ORDER — LACTATED RINGERS IV SOLN
INTRAVENOUS | Status: DC | PRN
  Administered 2016-12-28: 07:00:00 via INTRAVENOUS

## 2016-12-28 MED ORDER — HYDROMORPHONE HCL 1 MG/ML IJ SOLN
0.2500 mg | INTRAMUSCULAR | Status: DC | PRN
  Administered 2016-12-28 (×2): 0.5 mg via INTRAVENOUS

## 2016-12-28 MED ORDER — GENTAMICIN IN SALINE 1.6-0.9 MG/ML-% IV SOLN
80.0000 mg | Freq: Once | INTRAVENOUS | Status: AC
  Administered 2016-12-28: 80 mg via INTRAVENOUS
  Filled 2016-12-28: qty 50

## 2016-12-28 MED ORDER — LEVOFLOXACIN IN D5W 750 MG/150ML IV SOLN
INTRAVENOUS | Status: AC
  Filled 2016-12-28: qty 150

## 2016-12-28 MED ORDER — SODIUM CHLORIDE 0.9 % IR SOLN
Status: DC | PRN
  Administered 2016-12-28: 4000 mL

## 2016-12-28 MED ORDER — TRAMADOL HCL 50 MG PO TABS: 50.0000 mg | ORAL_TABLET | Freq: Four times a day (QID) | ORAL | 0 refills | Status: DC | PRN

## 2016-12-28 MED ORDER — FENTANYL CITRATE (PF) 100 MCG/2ML IJ SOLN
INTRAMUSCULAR | Status: AC
  Filled 2016-12-28: qty 2

## 2016-12-28 MED ORDER — PROPOFOL 10 MG/ML IV BOLUS
INTRAVENOUS | Status: AC
  Filled 2016-12-28: qty 20

## 2016-12-28 SURGICAL SUPPLY — 23 items
BAG URO CATCHER STRL LF (MISCELLANEOUS) ×4 IMPLANT
BASKET LASER NITINOL 1.9FR (BASKET) ×3 IMPLANT
BSKT STON RTRVL 120 1.9FR (BASKET) ×2
CATH INTERMIT  6FR 70CM (CATHETERS) ×4 IMPLANT
CLOTH BEACON ORANGE TIMEOUT ST (SAFETY) ×4 IMPLANT
FIBER LASER FLEXIVA 1000 (UROLOGICAL SUPPLIES) IMPLANT
FIBER LASER FLEXIVA 365 (UROLOGICAL SUPPLIES) IMPLANT
FIBER LASER FLEXIVA 550 (UROLOGICAL SUPPLIES) IMPLANT
FIBER LASER TRAC TIP (UROLOGICAL SUPPLIES) IMPLANT
GLOVE BIOGEL M STRL SZ7.5 (GLOVE) ×4 IMPLANT
GOWN STRL REUS W/TWL LRG LVL3 (GOWN DISPOSABLE) ×8 IMPLANT
GUIDEWIRE ANG ZIPWIRE 038X150 (WIRE) ×4 IMPLANT
GUIDEWIRE STR DUAL SENSOR (WIRE) ×4 IMPLANT
IV NS 1000ML (IV SOLUTION) ×4
IV NS 1000ML BAXH (IV SOLUTION) ×2 IMPLANT
MANIFOLD NEPTUNE II (INSTRUMENTS) ×4 IMPLANT
PACK CYSTO (CUSTOM PROCEDURE TRAY) ×4 IMPLANT
SHEATH ACCESS URETERAL 38CM (SHEATH) ×3 IMPLANT
STENT URET 6FRX26 CONTOUR (STENTS) ×3 IMPLANT
SYR CONTROL 10ML LL (SYRINGE) ×4 IMPLANT
TUBE FEEDING 8FR 16IN STR KANG (MISCELLANEOUS) ×4 IMPLANT
TUBING CONNECTING 10 (TUBING) ×3 IMPLANT
TUBING CONNECTING 10' (TUBING) ×1

## 2016-12-28 NOTE — Progress Notes (Signed)
O.K.  To go to Short Stay -per Dr. Tobias Alexander-

## 2016-12-28 NOTE — Discharge Instructions (Signed)
General Anesthesia, Adult, Care After These instructions provide you with information about caring for yourself after your procedure. Your health care provider may also give you more specific instructions. Your treatment has been planned according to current medical practices, but problems sometimes occur. Call your health care provider if you have any problems or questions after your procedure. What can I expect after the procedure? After the procedure, it is common to have:  Vomiting.  A sore throat.  Mental slowness. It is common to feel:  Nauseous.  Cold or shivery.  Sleepy.  Tired.  Sore or achy, even in parts of your body where you did not have surgery. Follow these instructions at home: For at least 24 hours after the procedure:  Do not:  Participate in activities where you could fall or become injured.  Drive.  Use heavy machinery.  Drink alcohol.  Take sleeping pills or medicines that cause drowsiness.  Make important decisions or sign legal documents.  Take care of children on your own.  Rest. Eating and drinking  If you vomit, drink water, juice, or soup when you can drink without vomiting.  Drink enough fluid to keep your urine clear or pale yellow.  Make sure you have little or no nausea before eating solid foods.  Follow the diet recommended by your health care provider. General instructions  Have a responsible adult stay with you until you are awake and alert.  Return to your normal activities as told by your health care provider. Ask your health care provider what activities are safe for you.  Take over-the-counter and prescription medicines only as told by your health care provider.  If you smoke, do not smoke without supervision.  Keep all follow-up visits as told by your health care provider. This is important. Contact a health care provider if:  You continue to have nausea or vomiting at home, and medicines are not helpful.  You  cannot drink fluids or start eating again.  You cannot urinate after 8-12 hours.  You develop a skin rash.  You have fever.  You have increasing redness at the site of your procedure. Get help right away if:  You have difficulty breathing.  You have chest pain.  You have unexpected bleeding.  You feel that you are having a life-threatening or urgent problem. This information is not intended to replace advice given to you by your health care provider. Make sure you discuss any questions you have with your health care provider. Document Released: 02/20/2001 Document Revised: 04/18/2016 Document Reviewed: 10/29/2015 Elsevier Interactive Patient Education  2017 [place] may have urinary urgency (bladder spasms) and bloody urine on / off with stent in place. This is normal.  2 - Call MD or go to ER for fever >102, severe pain / nausea / vomiting not relieved by medications, or acute change in medical status

## 2016-12-28 NOTE — Anesthesia Postprocedure Evaluation (Addendum)
Anesthesia Post Note  Patient: Victor Wang  Procedure(s) Performed: Procedure(s) (LRB): CYSTOSCOPY/RETROGRADE/URETEROSCOPY WITH BIOPSY right renal pelvis insertion double j stent (Right)  Patient location during evaluation: PACU Anesthesia Type: General Level of consciousness: sedated Pain management: pain level controlled Vital Signs Assessment: post-procedure vital signs reviewed and stable Respiratory status: spontaneous breathing and respiratory function stable Cardiovascular status: stable Anesthetic complications: no       Last Vitals:  Vitals:   12/28/16 0932 12/28/16 0935  BP: (!) 108/49 (!) 119/56  Pulse: 88 90  Resp: 20 20  Temp: 36.7 C     Last Pain:  Vitals:   12/28/16 0935  TempSrc:   PainSc: 4                  Sorayah Schrodt DANIEL

## 2016-12-28 NOTE — Progress Notes (Signed)
Dr. Tresa Moore talked with patient.

## 2016-12-28 NOTE — Anesthesia Procedure Notes (Signed)
Procedure Name: LMA Insertion Date/Time: 12/28/2016 7:31 AM Performed by: Lind Covert Pre-anesthesia Checklist: Patient identified, Emergency Drugs available, Suction available, Patient being monitored and Timeout performed Patient Re-evaluated:Patient Re-evaluated prior to inductionOxygen Delivery Method: Circle system utilized Preoxygenation: Pre-oxygenation with 100% oxygen Intubation Type: IV induction LMA: LMA inserted LMA Size: 5.0 Number of attempts: 1 Placement Confirmation: positive ETCO2 and breath sounds checked- equal and bilateral Dental Injury: Teeth and Oropharynx as per pre-operative assessment

## 2016-12-28 NOTE — H&P (Signed)
Victor Wang is an 82 y.o. male.    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 Former smoker and cytoxan use.    Recent History:    10/2016 CT - NEW 3-4 cm Rt renal pelvis enhancing mass worrisome for upper tract TCC on eva new gross hematuria.    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 Most recent Cr <1.0.    3 - Prostatic Hyperplasia - s/p TURP previously years ago, re-TURP 08/2015 as part of TURBT of tumor arising from right prostatic lobe urothelium.   4 - Gross Hematuria - new gross hematuria noted 10/2016 during CHF admission at [facility]. CT with strong concern for Rt renal pelvis cancer as per above.   PMH sig for significant obesity, HTN, Appy, Progressive COPD/obesity hypoventilation (O2 at times, CPAP). Denies CVA or ischemic heart disease. His PCP is Dr Ula Lingo    Today " Victor Wang " is seen to proceed with RIGHT ureteroscopy / biopsy / possible laser ablation for further evaluation of right renal pelvis mass.    Past Medical History:  Diagnosis Date  . Anemia   . Arthritis   . Bladder cancer (HCC)    RECURRENT  . BPH (benign prostatic hypertrophy)   . CHF (congestive heart failure) ([place])   . Clot 2007    kidney biopsy done  . Complication of anesthesia    oxygen level drops when put to sleep , has some bleeding issues after every surgery with cautery required  . COPD (chronic obstructive pulmonary disease) ([place])   . Dyspnea    exertion; can not complete a flight of stairs without stopping to catch his breath   . Full dentures   . GERD (gastroesophageal reflux disease)   . Headache   . History of blood transfusion last done 11-13-16   total of 7 units   . History of compression fracture of spine    04/2013   T12  . Hyperlipidemia   .  Hypertension   . Nephritis 2007   oral chemo and prednisone done  . Pneumonia last 2016   x 2 in 2016  . PONV (postoperative nausea and vomiting)   . Pulmonary hypertension    mild per 12-15-15 echo [facility]  . Renal mass    right  . Sleep apnea     Past Surgical History:  Procedure Laterality Date  . APPENDECTOMY  1985  . CARPAL TUNNEL RELEASE Right 02-05-2008  . CYSTOSCOPY W/ RETROGRADES Bilateral 10/29/2014   Procedure: CYSTOSCOPY WITH RETROGRADE PYELOGRAM;  Surgeon: Alexis Frock, MD;  Location: [facility];  Service: Urology;  Laterality: Bilateral;  . CYSTOSCOPY W/ RETROGRADES Bilateral 09/10/2015   Procedure: CYSTOSCOPY WITH RETROGRADE PYELOGRAM;  Surgeon: Alexis Frock, MD;  Location: [facility];  Service: Urology;  Laterality: Bilateral;  . CYSTOSCOPY WITH BIOPSY N/A 05/26/2014   Procedure: CYSTOSCOPY WITH BIOPSY;  Surgeon: Sharyn Creamer, MD;  Location: [facility];  Service: Urology;  Laterality: N/A;  . CYSTOSCOPY WITH RETROGRADE PYELOGRAM, URETEROSCOPY AND STENT PLACEMENT Bilateral 04/09/2014   Procedure: CYSTOSCOPY WITH RETROGRADE PYELOGRAM, POSSIBLE URETEROSCOPY WITH BIOPSY AND STENT PLACEMENT;  Surgeon: Sharyn Creamer, MD;  Location: [facility];  Service: Urology;  Laterality: Bilateral;  . KNEE LIGAMENT RECONSTRUCTION Right 1970  .  RIGHT SHOULDER SURGERY  2011  . TONSILLECTOMY AND ADENOIDECTOMY  CHILD   and adenoids  . TRANSURETHRAL RESECTION OF BLADDER TUMOR WITH GYRUS (TURBT-GYRUS) Bilateral 04/09/2014   Procedure: TRANSURETHRAL RESECTION OF BLADDER TUMOR WITH GYRUS (TURBT-GYRUS);  Surgeon: Sharyn Creamer, MD;  Location: [facility];  Service: Urology;  Laterality: Bilateral;  . TRANSURETHRAL RESECTION OF BLADDER TUMOR WITH GYRUS (TURBT-GYRUS) N/A 10/29/2014   Procedure: TRANSURETHRAL RESECTION OF BLADDER TUMOR WITH GYRUS (TURBT-GYRUS);  Surgeon: Alexis Frock, MD;  Location:  [facility];  Service: Urology;  Laterality: N/A;  . TRANSURETHRAL RESECTION OF BLADDER TUMOR WITH GYRUS (TURBT-GYRUS) N/A 09/10/2015   Procedure: TRANSURETHRAL RESECTION OF BLADDER TUMOR ;  Surgeon: Alexis Frock, MD;  Location: [facility];  Service: Urology;  Laterality: N/A;  . TRANSURETHRAL RESECTION OF PROSTATE  2006   AND    POST CAURTERIZATION OF BLEEDERS  . TRANSURETHRAL RESECTION OF PROSTATE N/A 09/10/2015   Procedure: TRANSURETHRAL RESECTION OF THE PROSTATE ;  Surgeon: Alexis Frock, MD;  Location: [facility];  Service: Urology;  Laterality: N/A;    History reviewed. No pertinent family history. Social History:  reports that he quit smoking about 31 years ago. His smoking use included Cigarettes. He has a 52.50 pack-year smoking history. He has never used smokeless tobacco. He reports that he drinks about 2.4 oz of alcohol per week . He reports that he does not use drugs.  Allergies:  Allergies  Allergen Reactions  . Bee Venom Anaphylaxis  . Penicillins Anaphylaxis, Hives and Other (See Comments)    Has patient had a PCN reaction causing immediate rash, facial/tongue/throat swelling, SOB or lightheadedness with hypotension: Yes Has patient had a PCN reaction causing severe rash involving mucus membranes or skin necrosis: Yes Has patient had a PCN reaction that required hospitalization No Has patient had a PCN reaction occurring within the last 10 years: No If all of the above answers are "NO", then may proceed with Cephalosporin use.     Medications Prior to Admission  Medication Sig Dispense Refill  . allopurinol (ZYLOPRIM) 100 MG tablet Take 100 mg by mouth daily.     . Calcium-Magnesium-Vitamin D (CALCIUM 1200+D3 PO) Take 1 tablet by mouth daily.    . Coenzyme Q10 (COQ10) 100 MG CAPS Take 100 mg by mouth daily.    . ferrous sulfate 325 (65 FE) MG tablet Take 325 mg by mouth 2 (two) times daily with a meal.    . furosemide  (LASIX) 40 MG tablet Take 40 mg by mouth 2 (two) times daily.    Marland Kitchen guaiFENesin (MUCINEX) 600 MG 12 hr tablet Take 600 mg by mouth 2 (two) times daily as needed for cough or to loosen phlegm.    Marland Kitchen ipratropium-albuterol (DUONEB) 0.5-2.5 (3) MG/3ML SOLN Take 3 mLs by nebulization every 6 (six) hours as needed (for wheezing/shortness of breath).    . losartan (COZAAR) 100 MG tablet Take 100 mg by mouth daily.     . metoprolol (LOPRESSOR) 50 MG tablet Take 50 mg by mouth daily.    . Omega 3 1200 MG CAPS Take 1,200 mg by mouth 2 (two) times daily.    Marland Kitchen omeprazole (PRILOSEC) 20 MG capsule Take 20 mg by mouth daily.     . potassium chloride SA (K-DUR,KLOR-CON) 20 MEQ tablet Take 20 mEq by mouth daily.    . pravastatin (PRAVACHOL) 80 MG tablet Take 80 mg by mouth daily.       No results  found for this or any previous visit (from the past 48 hour(s)). No results found.  Review of Systems  Constitutional: Negative.   HENT: Negative.   Eyes: Negative.   Respiratory: Negative.   Cardiovascular: Negative.   Gastrointestinal: Negative.   Genitourinary: Positive for hematuria.  Musculoskeletal: Negative.   Skin: Negative.   Neurological: Negative.   Endo/Heme/Allergies: Negative.   Psychiatric/Behavioral: Negative.     Blood pressure 116/62, pulse 96, temperature 99.8 F (37.7 C), temperature source Oral, resp. rate 20, height 6' (1.829 m), weight (!) 144.2 kg (318 lb), SpO2 100 %. Physical Exam  Constitutional: He appears well-developed.  HENT:  Head: Normocephalic.  Neck: Normal range of motion.  Cardiovascular: Normal rate.   Respiratory: Effort normal.  GI:  Stable truncal obesity  Genitourinary:  Genitourinary Comments: No CVAT.   Musculoskeletal: Normal range of motion.  Neurological: He is alert.  Skin: Skin is warm.  Psychiatric: He has a normal mood and affect. His behavior is normal. Judgment and thought content normal.     Assessment/Plan  New Rt renal pelvis mass quite  concerning. His functional capacity is quite poor.   Proceed with cysto / Rt retrograde / Rt ureteroscopy and possible BX or ablation. If confirms large volume or high grade cancer he would ideally be served by nephrectomy / nephro-ureterectomy but I do not think he could get through that. Less aggressive options would be palliation with Tecentriq or similar or palliative transition.   Risks, benefits, alternatives, expected peri-op course discussed.   I am amenable to any type of anesthesia pending whatever is felt safest from pt's CV perspective.    Alexis Frock, MD 12/28/2016, 6:36 AM

## 2016-12-28 NOTE — Transfer of Care (Signed)
Immediate Anesthesia Transfer of Care Note  Patient: Victor Wang  Procedure(s) Performed: Procedure(s): CYSTOSCOPY/RETROGRADE/URETEROSCOPY WITH BIOPSY right renal pelvis insertion double j stent (Right)  Patient Location: PACU  Anesthesia Type:General  Level of Consciousness: awake, alert  and oriented  Airway & Oxygen Therapy: Patient Spontanous Breathing and Patient connected to face mask oxygen  Post-op Assessment: Report given to RN and Post -op Vital signs reviewed and stable  Post vital signs: Reviewed and stable  Last Vitals:  Vitals:   12/28/16 0616  BP: 116/62  Pulse: 96  Resp: 20  Temp: 37.7 C    Last Pain:  Vitals:   12/28/16 0616  TempSrc: Oral         Complications: No apparent anesthesia complications

## 2016-12-28 NOTE — Brief Op Note (Signed)
12/28/2016  8:11 AM  PATIENT:  Victor Wang  82 y.o. male  PRE-OPERATIVE DIAGNOSIS:  RIGHT RENAL PELVIS MASS, HEMATURIA  POST-OPERATIVE DIAGNOSIS:  right renal pelvis mass hematura  PROCEDURE:  Procedure(s): CYSTOSCOPY/RETROGRADE/URETEROSCOPY WITH BIOPSY right renal pelvis insertion double j stent (Right)  SURGEON:  Surgeon(s) and Role:    * Alexis Frock, MD - Primary  PHYSICIAN ASSISTANT:   ASSISTANTS: none   ANESTHESIA:   general  EBL:  Total I/O In: 700 [I.V.:700] Out: -   BLOOD ADMINISTERED:none  DRAINS: none   LOCAL MEDICATIONS USED:  NONE  SPECIMEN:  Source of Specimen:  Rt renal pelvis mass  DISPOSITION OF SPECIMEN:  PATHOLOGY  COUNTS:  YES  TOURNIQUET:  * No tourniquets in log *  DICTATION: .Other Dictation: Dictation Number [id number]  PLAN OF CARE: Discharge to home after PACU  PATIENT DISPOSITION:  PACU - hemodynamically stable.   Delay start of Pharmacological VTE agent (>24hrs) due to surgical blood loss or risk of bleeding: yes

## 2016-12-29 ENCOUNTER — Encounter (HOSPITAL_COMMUNITY): Payer: Self-pay | Admitting: Urology

## 2016-12-29 DIAGNOSIS — G4733 Obstructive sleep apnea (adult) (pediatric): Secondary | ICD-10-CM | POA: Diagnosis not present

## 2016-12-29 DIAGNOSIS — E662 Morbid (severe) obesity with alveolar hypoventilation: Secondary | ICD-10-CM | POA: Diagnosis not present

## 2016-12-29 DIAGNOSIS — I5033 Acute on chronic diastolic (congestive) heart failure: Secondary | ICD-10-CM | POA: Diagnosis not present

## 2016-12-29 DIAGNOSIS — C679 Malignant neoplasm of bladder, unspecified: Secondary | ICD-10-CM | POA: Diagnosis not present

## 2016-12-29 DIAGNOSIS — I872 Venous insufficiency (chronic) (peripheral): Secondary | ICD-10-CM | POA: Diagnosis not present

## 2016-12-29 DIAGNOSIS — I130 Hypertensive heart and chronic kidney disease with heart failure and stage 1 through stage 4 chronic kidney disease, or unspecified chronic kidney disease: Secondary | ICD-10-CM | POA: Diagnosis not present

## 2016-12-29 NOTE — Op Note (Signed)
Victor Wang, ALOISI NO.:  [id number]  MEDICAL RECORD NO.:  [id number]  LOCATION:                                 FACILITY:  PHYSICIAN:  Alexis Frock, MD     DATE OF BIRTH:  04-04-1935  DATE OF PROCEDURE: 12/28/2016                              OPERATIVE REPORT   PREOPERATIVE DIAGNOSIS:  Right renal pelvis mass with recurrent hematuria.  PROCEDURE PERFORMED: 1. Cystoscopy with right retrograde pyelogram and interpretation. 2. Right ureteroscopy with biopsy. 3. Insertion of right ureteral stent, 6 x 26 Contour, no tether.  ESTIMATED BLOOD LOSS:  Nil.  COMPLICATIONS:  None.  SPECIMENS:  Right renal pelvis mass for permanent pathology.  FINDINGS: 1. Filling defect in the right renal pelvis.  This appeared to be     mostly papillary tumor with some nodular component, representative     area snared, and set aside for permanent pathology. 2. Successful placement of right ureteral stent, proximal on renal     pelvis and distal on urinary bladder. 3. Unremarkable urinary bladder. 4. Unremarkable right ureter.  INDICATION:  Mr. Deschene is a very pleasant, but somewhat comorbid 82- year-old gentleman with history of morbid obesity, COPD, and congestive heart failure with recurrent exacerbations.  His functional status is fairly good at baseline.  He has been followed for a known history of bladder cancer for sometime.  He has been compliant with followup and has been without evidence of recurrent disease.  On surveillance, however, he developed a new gross hematuria and was found on axial imaging to have a new somewhat infiltrative appearing right renal pelvis mass highly concerning for right urothelial carcinoma, renal pelvis. This was clinically localized.  It is clearly felt that the next best diagnostic and therapeutic maneuver would be ureteroscopy with biopsy, possible tumor ablation if appears minimal.  He wished to proceed. Informed consent was  obtained and placed in the medical record. Notably, the patient is at his functional baseline.  No new edema, fevers, or productive cough.  PROCEDURE IN DETAIL:  The patient being Britian Jentz verified. Procedure being cystoscopy with right retrograde, right ureteroscopy with biopsy was confirmed.  Procedure was carried out.  Time-out was performed.  Intravenous antibiotics were administered.  General LMA anesthesia was induced.  The patient was placed into a low lithotomy position.  Sterile field was created by prepping and draping the patient's penis, perineum, and proximal thighs using iodine.  Next, cystourethroscopy was performed using a rigid cystoscope with offset lens.  Inspection of the anterior and posterior urethra revealed a prior TURP defect in the prostatic fossa.  Inspection of urinary bladder revealed no diverticula, calcifications, or papillary lesions.  There was some formed clot that was irrigated, that was small volume. Ureteral orifices were anatomic.  No papular lesions noted again.  Right ureteral orifice was cannulated with 6-French end-hole catheter and right retrograde pyelogram was obtained.  Right retrograde pyelogram demonstrated a single right ureter with single-system right kidney.  There was a filling defect in the renal pelvis somewhat shaggy appearing consistent with known renal pelvis mass.  A 0.038 Zip wire was advanced at the level of the upper pole, set aside as a  safety wire.  An 8-French feeding tube placed in the urinary bladder for pressure release.  Semi-rigid ureteroscopy was performed to the distal four-fifths of the right ureter alongside a separate Sensor working wire.  No mucosal abnormalities were seen whatsoever.  This was exchanged for a 12/14 medium-sized ureteral access sheath at the level of proximal ureter using continuous fluoroscopic guidance and flexible digital ureteroscopy was performed of the proximal ureter and  systematic inspection of the right kidney including all calices x3.  As anticipated there was a predominantly papillary, but also with nodular component renal pelvis mass.  There was some formed clot as well.  There was no active bleeding.  This mass given as mostly papillary component did appear amenable to a basketing technique biopsy, as such an Escape basket was used to snare representative areas of the tumor, which were sequentially grasped, removed, and set aside for pathology.  This was performed x3.  Given the size of the mass somewhat nodular components, it was not felt that this was amenable to successful laser ablation. The patient would most likely benefit from right nephroureterectomy versus palliative approach depending on his wishes, but of course favoring nephroureterectomy, therefore felt that right ureteral stent placement was clearly warranted to prevent obstruction, but also to aid in future nephroureterectomy if he decides to proceed with that as such the access sheath was removed under continuous vision.  No mucosal abnormalities were found.  A new 6 x 26 Contour-type stent was placed using cystoscopic and fluoroscopic guidance proximal on renal pelvis and distal on urinary bladder.  The bladder was partially emptied per cystoscope.  Procedure was then terminated.  The patient tolerated the procedure well.  There were no immediate periprocedural complications. The patient was taken to the Postanesthesia Care Unit in stable condition.    ______________________________ Alexis Frock, MD   ______________________________ Alexis Frock, MD    TM/MEDQ  D:  12/28/2016  T:  12/29/2016  Job:  [id number]

## 2017-01-01 ENCOUNTER — Inpatient Hospital Stay (HOSPITAL_COMMUNITY): Payer: Medicare Other

## 2017-01-01 ENCOUNTER — Inpatient Hospital Stay (HOSPITAL_COMMUNITY)
Admission: AD | Admit: 2017-01-01 | Discharge: 2017-01-04 | DRG: 687 | Disposition: A | Discharge status: Home / Self Care | Payer: Medicare Other | Source: Other Acute Inpatient Hospital | Attending: Internal Medicine | Admitting: Internal Medicine

## 2017-01-01 ENCOUNTER — Encounter (HOSPITAL_COMMUNITY): Payer: Self-pay | Admitting: Internal Medicine

## 2017-01-01 DIAGNOSIS — J449 Chronic obstructive pulmonary disease, unspecified: Secondary | ICD-10-CM | POA: Diagnosis present

## 2017-01-01 DIAGNOSIS — N029 Recurrent and persistent hematuria with unspecified morphologic changes: Secondary | ICD-10-CM | POA: Diagnosis present

## 2017-01-01 DIAGNOSIS — M1990 Unspecified osteoarthritis, unspecified site: Secondary | ICD-10-CM | POA: Diagnosis present

## 2017-01-01 DIAGNOSIS — Z87891 Personal history of nicotine dependence: Secondary | ICD-10-CM | POA: Diagnosis not present

## 2017-01-01 DIAGNOSIS — I959 Hypotension, unspecified: Secondary | ICD-10-CM | POA: Diagnosis not present

## 2017-01-01 DIAGNOSIS — N390 Urinary tract infection, site not specified: Secondary | ICD-10-CM | POA: Diagnosis present

## 2017-01-01 DIAGNOSIS — I509 Heart failure, unspecified: Secondary | ICD-10-CM

## 2017-01-01 DIAGNOSIS — I130 Hypertensive heart and chronic kidney disease with heart failure and stage 1 through stage 4 chronic kidney disease, or unspecified chronic kidney disease: Secondary | ICD-10-CM | POA: Diagnosis present

## 2017-01-01 DIAGNOSIS — Z888 Allergy status to other drugs, medicaments and biological substances status: Secondary | ICD-10-CM

## 2017-01-01 DIAGNOSIS — G4733 Obstructive sleep apnea (adult) (pediatric): Secondary | ICD-10-CM | POA: Diagnosis present

## 2017-01-01 DIAGNOSIS — I872 Venous insufficiency (chronic) (peripheral): Secondary | ICD-10-CM | POA: Diagnosis present

## 2017-01-01 DIAGNOSIS — Z91030 Bee allergy status: Secondary | ICD-10-CM

## 2017-01-01 DIAGNOSIS — E860 Dehydration: Secondary | ICD-10-CM | POA: Diagnosis present

## 2017-01-01 DIAGNOSIS — K219 Gastro-esophageal reflux disease without esophagitis: Secondary | ICD-10-CM | POA: Diagnosis present

## 2017-01-01 DIAGNOSIS — D62 Acute posthemorrhagic anemia: Secondary | ICD-10-CM | POA: Diagnosis present

## 2017-01-01 DIAGNOSIS — N189 Chronic kidney disease, unspecified: Secondary | ICD-10-CM | POA: Diagnosis not present

## 2017-01-01 DIAGNOSIS — E662 Morbid (severe) obesity with alveolar hypoventilation: Secondary | ICD-10-CM | POA: Diagnosis present

## 2017-01-01 DIAGNOSIS — C651 Malignant neoplasm of right renal pelvis: Secondary | ICD-10-CM | POA: Diagnosis not present

## 2017-01-01 DIAGNOSIS — N179 Acute kidney failure, unspecified: Secondary | ICD-10-CM | POA: Diagnosis present

## 2017-01-01 DIAGNOSIS — N400 Enlarged prostate without lower urinary tract symptoms: Secondary | ICD-10-CM | POA: Diagnosis present

## 2017-01-01 DIAGNOSIS — E7800 Pure hypercholesterolemia, unspecified: Secondary | ICD-10-CM | POA: Diagnosis not present

## 2017-01-01 DIAGNOSIS — I5042 Chronic combined systolic (congestive) and diastolic (congestive) heart failure: Secondary | ICD-10-CM | POA: Diagnosis present

## 2017-01-01 DIAGNOSIS — Z79899 Other long term (current) drug therapy: Secondary | ICD-10-CM | POA: Diagnosis not present

## 2017-01-01 DIAGNOSIS — K8020 Calculus of gallbladder without cholecystitis without obstruction: Secondary | ICD-10-CM | POA: Diagnosis not present

## 2017-01-01 DIAGNOSIS — R1011 Right upper quadrant pain: Secondary | ICD-10-CM

## 2017-01-01 DIAGNOSIS — N182 Chronic kidney disease, stage 2 (mild): Secondary | ICD-10-CM | POA: Diagnosis present

## 2017-01-01 DIAGNOSIS — C679 Malignant neoplasm of bladder, unspecified: Secondary | ICD-10-CM | POA: Diagnosis present

## 2017-01-01 DIAGNOSIS — K7460 Unspecified cirrhosis of liver: Secondary | ICD-10-CM | POA: Diagnosis present

## 2017-01-01 DIAGNOSIS — D500 Iron deficiency anemia secondary to blood loss (chronic): Secondary | ICD-10-CM | POA: Diagnosis present

## 2017-01-01 DIAGNOSIS — R310 Gross hematuria: Secondary | ICD-10-CM | POA: Diagnosis present

## 2017-01-01 DIAGNOSIS — R109 Unspecified abdominal pain: Secondary | ICD-10-CM | POA: Diagnosis present

## 2017-01-01 DIAGNOSIS — I110 Hypertensive heart disease with heart failure: Secondary | ICD-10-CM | POA: Diagnosis not present

## 2017-01-01 DIAGNOSIS — I10 Essential (primary) hypertension: Secondary | ICD-10-CM | POA: Diagnosis not present

## 2017-01-01 DIAGNOSIS — E861 Hypovolemia: Secondary | ICD-10-CM | POA: Diagnosis present

## 2017-01-01 DIAGNOSIS — I272 Pulmonary hypertension, unspecified: Secondary | ICD-10-CM | POA: Diagnosis present

## 2017-01-01 DIAGNOSIS — Z6841 Body Mass Index (BMI) 40.0 and over, adult: Secondary | ICD-10-CM

## 2017-01-01 DIAGNOSIS — Z880 Allergy status to penicillin: Secondary | ICD-10-CM

## 2017-01-01 DIAGNOSIS — E785 Hyperlipidemia, unspecified: Secondary | ICD-10-CM | POA: Diagnosis present

## 2017-01-01 DIAGNOSIS — E871 Hypo-osmolality and hyponatremia: Secondary | ICD-10-CM | POA: Diagnosis present

## 2017-01-01 DIAGNOSIS — J9811 Atelectasis: Secondary | ICD-10-CM | POA: Diagnosis not present

## 2017-01-01 DIAGNOSIS — W19XXXA Unspecified fall, initial encounter: Secondary | ICD-10-CM | POA: Diagnosis present

## 2017-01-01 DIAGNOSIS — N2889 Other specified disorders of kidney and ureter: Secondary | ICD-10-CM | POA: Diagnosis not present

## 2017-01-01 DIAGNOSIS — R531 Weakness: Secondary | ICD-10-CM | POA: Diagnosis not present

## 2017-01-01 DIAGNOSIS — R319 Hematuria, unspecified: Secondary | ICD-10-CM | POA: Diagnosis not present

## 2017-01-01 LAB — CBC WITH DIFFERENTIAL/PLATELET
BASOS ABS: 0 10*3/uL (ref 0.0–0.1)
BASOS PCT: 0 %
EOS ABS: 0 10*3/uL (ref 0.0–0.7)
Eosinophils Relative: 0 %
HEMATOCRIT: 22 % — AB (ref 39.0–52.0)
Hemoglobin: 7.3 g/dL — ABNORMAL LOW (ref 13.0–17.0)
Lymphocytes Relative: 5 %
Lymphs Abs: 0.4 10*3/uL — ABNORMAL LOW (ref 0.7–4.0)
MCH: 30.2 pg (ref 26.0–34.0)
MCHC: 33.2 g/dL (ref 30.0–36.0)
MCV: 90.9 fL (ref 78.0–100.0)
MONO ABS: 0.8 10*3/uL (ref 0.1–1.0)
Monocytes Relative: 10 %
NEUTROS ABS: 7.6 10*3/uL (ref 1.7–7.7)
Neutrophils Relative %: 85 %
PLATELETS: 168 10*3/uL (ref 150–400)
RBC: 2.42 MIL/uL — ABNORMAL LOW (ref 4.22–5.81)
RDW: 18.1 % — AB (ref 11.5–15.5)
WBC: 8.8 10*3/uL (ref 4.0–10.5)

## 2017-01-01 LAB — COMPREHENSIVE METABOLIC PANEL
ALK PHOS: 118 U/L (ref 38–126)
ALT: 18 U/L (ref 17–63)
ANION GAP: 6 (ref 5–15)
AST: 35 U/L (ref 15–41)
Albumin: 2.6 g/dL — ABNORMAL LOW (ref 3.5–5.0)
BILIRUBIN TOTAL: 1.4 mg/dL — AB (ref 0.3–1.2)
BUN: 37 mg/dL — ABNORMAL HIGH (ref 6–20)
CALCIUM: 8 mg/dL — AB (ref 8.9–10.3)
CO2: 25 mmol/L (ref 22–32)
Chloride: 93 mmol/L — ABNORMAL LOW (ref 101–111)
Creatinine, Ser: 1.48 mg/dL — ABNORMAL HIGH (ref 0.61–1.24)
GFR calc non Af Amer: 43 mL/min — ABNORMAL LOW (ref >60)
GFR, EST AFRICAN AMERICAN: 50 mL/min — AB (ref >60)
GLUCOSE: 131 mg/dL — AB (ref 65–99)
Potassium: 4.6 mmol/L (ref 3.5–5.1)
Sodium: 124 mmol/L — ABNORMAL LOW (ref 135–145)
TOTAL PROTEIN: 5.5 g/dL — AB (ref 6.5–8.1)

## 2017-01-01 LAB — URINALYSIS, ROUTINE W REFLEX MICROSCOPIC
Bilirubin Urine: NEGATIVE
Glucose, UA: NEGATIVE mg/dL
Ketones, ur: NEGATIVE mg/dL
NITRITE: NEGATIVE
PROTEIN: 30 mg/dL — AB
SPECIFIC GRAVITY, URINE: 1.011 (ref 1.005–1.030)
pH: 5 (ref 5.0–8.0)

## 2017-01-01 LAB — TYPE AND SCREEN
ABO/RH(D): A POS
Antibody Screen: NEGATIVE
UNIT DIVISION: 0
UNIT DIVISION: 0

## 2017-01-01 LAB — LACTIC ACID, PLASMA: Lactic Acid, Venous: 1.2 mmol/L (ref 0.5–1.9)

## 2017-01-01 MED ORDER — SODIUM CHLORIDE 0.9 % IV SOLN
INTRAVENOUS | Status: DC
  Administered 2017-01-01: 10 mL via INTRAVENOUS
  Administered 2017-01-03: 01:00:00 via INTRAVENOUS

## 2017-01-01 MED ORDER — ONDANSETRON HCL 4 MG PO TABS: 4.0000 mg | ORAL_TABLET | Freq: Four times a day (QID) | ORAL | Status: DC | PRN

## 2017-01-01 MED ORDER — PRAVASTATIN SODIUM 20 MG PO TABS
80.0000 mg | ORAL_TABLET | Freq: Every day | ORAL | Status: DC
  Administered 2017-01-02 – 2017-01-04 (×3): 80 mg via ORAL
  Filled 2017-01-01 (×3): qty 4

## 2017-01-01 MED ORDER — ACETAMINOPHEN 325 MG PO TABS: 650.0000 mg | ORAL_TABLET | Freq: Four times a day (QID) | ORAL | Status: DC | PRN

## 2017-01-01 MED ORDER — PANTOPRAZOLE SODIUM 40 MG PO TBEC
40.0000 mg | DELAYED_RELEASE_TABLET | Freq: Every day | ORAL | Status: DC
  Administered 2017-01-02 – 2017-01-04 (×3): 40 mg via ORAL
  Filled 2017-01-01 (×3): qty 1

## 2017-01-01 MED ORDER — ACETAMINOPHEN 650 MG RE SUPP: 650.0000 mg | Freq: Four times a day (QID) | RECTAL | Status: DC | PRN

## 2017-01-01 MED ORDER — ALLOPURINOL 100 MG PO TABS
100.0000 mg | ORAL_TABLET | Freq: Every day | ORAL | Status: DC
  Administered 2017-01-02 – 2017-01-04 (×3): 100 mg via ORAL
  Filled 2017-01-01 (×3): qty 1

## 2017-01-01 MED ORDER — IPRATROPIUM-ALBUTEROL 0.5-2.5 (3) MG/3ML IN SOLN: 3.0000 mL | Freq: Four times a day (QID) | RESPIRATORY_TRACT | Status: DC | PRN

## 2017-01-01 MED ORDER — ONDANSETRON HCL 4 MG/2ML IJ SOLN: 4.0000 mg | Freq: Four times a day (QID) | INTRAMUSCULAR | Status: DC | PRN

## 2017-01-01 MED ORDER — FERROUS SULFATE 325 (65 FE) MG PO TABS
325.0000 mg | ORAL_TABLET | Freq: Two times a day (BID) | ORAL | Status: DC
  Administered 2017-01-02 – 2017-01-04 (×5): 325 mg via ORAL
  Filled 2017-01-01 (×5): qty 1

## 2017-01-01 NOTE — Progress Notes (Signed)
Pt has declined the use of CPAP for the night.  Pt to notify RT if he changes his mind.  RT to monitor and assess as needed.

## 2017-01-01 NOTE — H&P (Signed)
History and Physical    Victor Wang XLK:[id number] DOB: 08/10/1935 DOA: 01/01/2017  PCP: Townsend Roger, MD  Patient coming from: Patient was transferred from [facility].  Chief Complaint: Weakness and fall.  HPI: Victor Wang is a 82 y.o. male with history of combined systolic and diastolic CHF, hypertension, COPD, sleep apnea, chronic kidney disease and recent right kidney biopsy and stent placement by Dr. Tresa Moore last week presents to the ER at [facility] due to persistent weakness. Patient stated after procedure he was doing well and the following day he had a fall and has been having persistent pain in the right flank area. Patient also had been having ongoing hematuria which has gradually improved but still persists. Denies any nausea vomiting or diarrhea chest pain or shortness of breath. In the ER at [facility] patient was found to be hypotensive with a blood pressure 90 systolic hemoglobin was around 7.1 and patient had hematuria. Chest x-ray was showing congestion creatinine of 1.7 which was increased from baseline of normal last week. Patient was given 500 mL fluid bolus and 1 unit of PRBC and transferred to [facility]. On my exam patient denies any chest pain and is not in distress. Complains of right flank pain. Renal sonogram done at [facility] did not show anything acute.   ED Course: Patient was a direct admit.  Review of Systems: As per HPI, rest all negative.   Past Medical History:  Diagnosis Date  . Anemia   . Arthritis   . Bladder cancer (HCC)    RECURRENT  . BPH (benign prostatic hypertrophy)   . CHF (congestive heart failure) ([place])   . Clot 2007    kidney biopsy done  . Complication of anesthesia    oxygen level drops when put to sleep , has some bleeding issues after every surgery with cautery required  . COPD (chronic obstructive pulmonary disease) ([place])   . Dyspnea    exertion; can not complete a flight of  stairs without stopping to catch his breath   . Full dentures   . GERD (gastroesophageal reflux disease)   . Headache   . History of blood transfusion last done 11-13-16   total of 7 units   . History of compression fracture of spine    04/2013   T12  . Hyperlipidemia   . Hypertension   . Nephritis 2007   oral chemo and prednisone done  . Pneumonia last 2016   x 2 in 2016  . PONV (postoperative nausea and vomiting)   . Pulmonary hypertension    mild per 12-15-15 echo [facility]  . Renal mass    right  . Sleep apnea     Past Surgical History:  Procedure Laterality Date  . APPENDECTOMY  1985  . CARPAL TUNNEL RELEASE Right 02-05-2008  . CYSTOSCOPY W/ RETROGRADES Bilateral 10/29/2014   Procedure: CYSTOSCOPY WITH RETROGRADE PYELOGRAM;  Surgeon: Alexis Frock, MD;  Location: [facility];  Service: Urology;  Laterality: Bilateral;  . CYSTOSCOPY W/ RETROGRADES Bilateral 09/10/2015   Procedure: CYSTOSCOPY WITH RETROGRADE PYELOGRAM;  Surgeon: Alexis Frock, MD;  Location: [facility];  Service: Urology;  Laterality: Bilateral;  . CYSTOSCOPY WITH BIOPSY N/A 05/26/2014   Procedure: CYSTOSCOPY WITH BIOPSY;  Surgeon: Sharyn Creamer, MD;  Location: [facility];  Service: Urology;  Laterality: N/A;  . CYSTOSCOPY WITH RETROGRADE PYELOGRAM, URETEROSCOPY AND STENT PLACEMENT Bilateral 04/09/2014   Procedure: CYSTOSCOPY WITH RETROGRADE PYELOGRAM,  POSSIBLE URETEROSCOPY WITH BIOPSY AND STENT PLACEMENT;  Surgeon: Sharyn Creamer, MD;  Location: [facility];  Service: Urology;  Laterality: Bilateral;  . CYSTOSCOPY/RETROGRADE/URETEROSCOPY Right 12/28/2016   Procedure: CYSTOSCOPY/RETROGRADE/URETEROSCOPY WITH BIOPSY right renal pelvis insertion double j stent;  Surgeon: Alexis Frock, MD;  Location: [facility];  Service: Urology;  Laterality: Right;  . KNEE LIGAMENT RECONSTRUCTION Right 1970  . RIGHT SHOULDER SURGERY  2011  . TONSILLECTOMY  AND ADENOIDECTOMY  CHILD   and adenoids  . TRANSURETHRAL RESECTION OF BLADDER TUMOR WITH GYRUS (TURBT-GYRUS) Bilateral 04/09/2014   Procedure: TRANSURETHRAL RESECTION OF BLADDER TUMOR WITH GYRUS (TURBT-GYRUS);  Surgeon: Sharyn Creamer, MD;  Location: [facility];  Service: Urology;  Laterality: Bilateral;  . TRANSURETHRAL RESECTION OF BLADDER TUMOR WITH GYRUS (TURBT-GYRUS) N/A 10/29/2014   Procedure: TRANSURETHRAL RESECTION OF BLADDER TUMOR WITH GYRUS (TURBT-GYRUS);  Surgeon: Alexis Frock, MD;  Location: [facility];  Service: Urology;  Laterality: N/A;  . TRANSURETHRAL RESECTION OF BLADDER TUMOR WITH GYRUS (TURBT-GYRUS) N/A 09/10/2015   Procedure: TRANSURETHRAL RESECTION OF BLADDER TUMOR ;  Surgeon: Alexis Frock, MD;  Location: [facility];  Service: Urology;  Laterality: N/A;  . TRANSURETHRAL RESECTION OF PROSTATE  2006   AND    POST CAURTERIZATION OF BLEEDERS  . TRANSURETHRAL RESECTION OF PROSTATE N/A 09/10/2015   Procedure: TRANSURETHRAL RESECTION OF THE PROSTATE ;  Surgeon: Alexis Frock, MD;  Location: [facility];  Service: Urology;  Laterality: N/A;     reports that he quit smoking about 31 years ago. His smoking use included Cigarettes. He has a 52.50 pack-year smoking history. He has never used smokeless tobacco. He reports that he drinks about 2.4 oz of alcohol per week . He reports that he does not use drugs.  Allergies  Allergen Reactions  . Bee Venom Anaphylaxis  . Penicillins Anaphylaxis, Hives and Other (See Comments)    Has patient had a PCN reaction causing immediate rash, facial/tongue/throat swelling, SOB or lightheadedness with hypotension: Yes Has patient had a PCN reaction causing severe rash involving mucus membranes or skin necrosis: Yes Has patient had a PCN reaction that required hospitalization No Has patient had a PCN reaction occurring within the last 10 years: No If all of the above answers are  "NO", then may proceed with Cephalosporin use.   . Tramadol Other (See Comments)    Dizzy and loopy.     Family History  Problem Relation Age of Onset  . Congestive Heart Failure Mother   . Bladder Cancer Neg Hx     Prior to Admission medications   Medication Sig Start Date End Date Taking? Authorizing Provider  acetaminophen (TYLENOL) 500 MG tablet Take 1,000 mg by mouth every 6 (six) hours as needed for moderate pain.   Yes Historical Provider, MD  allopurinol (ZYLOPRIM) 100 MG tablet Take 100 mg by mouth daily.    Yes Historical Provider, MD  Calcium-Magnesium-Vitamin D (CALCIUM 1200+D3 PO) Take 1 tablet by mouth daily.   Yes Historical Provider, MD  Coenzyme Q10 (COQ10) 100 MG CAPS Take 100 mg by mouth daily.   Yes Historical Provider, MD  ferrous sulfate 325 (65 FE) MG tablet Take 325 mg by mouth 2 (two) times daily with a meal.   Yes Historical Provider, MD  furosemide (LASIX) 40 MG tablet Take 40 mg by mouth 2 (two) times daily.   Yes Historical Provider, MD  ipratropium-albuterol (DUONEB) 0.5-2.5 (3) MG/3ML SOLN Take 3 mLs by nebulization every 6 (  six) hours as needed (for wheezing/shortness of breath).   Yes Historical Provider, MD  losartan (COZAAR) 100 MG tablet Take 100 mg by mouth daily.    Yes Historical Provider, MD  metoprolol succinate (TOPROL-XL) 50 MG 24 hr tablet Take 50 mg by mouth daily. Take with or immediately following a meal.   Yes Historical Provider, MD  omeprazole (PRILOSEC) 20 MG capsule Take 20 mg by mouth daily.    Yes Historical Provider, MD  potassium chloride SA (K-DUR,KLOR-CON) 20 MEQ tablet Take 20 mEq by mouth daily.   Yes Historical Provider, MD  pravastatin (PRAVACHOL) 80 MG tablet Take 80 mg by mouth daily.    Yes Historical Provider, MD  traMADol (ULTRAM) 50 MG tablet Take 1 tablet (50 mg total) by mouth every 6 (six) hours as needed for moderate pain. Post-operatively Patient not taking: Reported on 01/01/2017 12/28/16   Alexis Frock, MD     Physical Exam:Blood pressure was 110/76, respiration 18, and temperature 97.4 and O2 sat was 100%. There were no vitals filed for this visit.    Constitutional: Moderately built and nourished. There were no vitals filed for this visit. Eyes: Anicteric no pallor. ENMT: No discharge from the ears eyes nose and mouth. Neck: No mass felt. No JVD appreciated. Respiratory: Bilateral air entry present. No rhonchi or crepitation. Cardiovascular: S1-S2 heard no murmurs appreciated. Abdomen: Mild tenderness in the right flank area. Musculoskeletal: Bilateral lower extremity edema. Skin: Chronic skin changes of the lower extremity. Neurologic: Alert awake oriented to time place and person. Moves all extremities. Psychiatric: Appears normal. Normal affect.   Labs on Admission: I have personally reviewed following labs and imaging studies  CBC: No results for input(s): WBC, NEUTROABS, HGB, HCT, MCV, PLT in the last 168 hours. Basic Metabolic Panel: No results for input(s): NA, K, CL, CO2, GLUCOSE, BUN, CREATININE, CALCIUM, MG, PHOS in the last 168 hours. GFR: Estimated Creatinine Clearance: 110.4 mL/min (by C-G formula based on SCr of 0.76 mg/dL). Liver Function Tests: No results for input(s): AST, ALT, ALKPHOS, BILITOT, PROT, ALBUMIN in the last 168 hours. No results for input(s): LIPASE, AMYLASE in the last 168 hours. No results for input(s): AMMONIA in the last 168 hours. Coagulation Profile: No results for input(s): INR, PROTIME in the last 168 hours. Cardiac Enzymes: No results for input(s): CKTOTAL, CKMB, CKMBINDEX, TROPONINI in the last 168 hours. BNP (last 3 results) No results for input(s): PROBNP in the last 8760 hours. HbA1C: No results for input(s): HGBA1C in the last 72 hours. CBG: No results for input(s): GLUCAP in the last 168 hours. Lipid Profile: No results for input(s): CHOL, HDL, LDLCALC, TRIG, CHOLHDL, LDLDIRECT in the last 72 hours. Thyroid Function Tests: No  results for input(s): TSH, T4TOTAL, FREET4, T3FREE, THYROIDAB in the last 72 hours. Anemia Panel: No results for input(s): VITAMINB12, FOLATE, FERRITIN, TIBC, IRON, RETICCTPCT in the last 72 hours. Urine analysis:    Component Value Date/Time   COLORURINE RED (A) 11/15/2016 1730   APPEARANCEUR CLOUDY (A) 11/15/2016 1730   LABSPEC 1.012 11/15/2016 1730   PHURINE 7.0 11/15/2016 1730   GLUCOSEU NEGATIVE 11/15/2016 1730   HGBUR LARGE (A) 11/15/2016 1730   BILIRUBINUR NEGATIVE 11/15/2016 1730   KETONESUR NEGATIVE 11/15/2016 1730   PROTEINUR 100 (A) 11/15/2016 1730   NITRITE NEGATIVE 11/15/2016 1730   LEUKOCYTESUR TRACE (A) 11/15/2016 1730   Sepsis Labs: '@LABRCNTIP'$ (procalcitonin:4,lacticidven:4) )No results found for this or any previous visit (from the past 240 hour(s)).   Radiological Exams on Admission: No  results found.   Assessment/Plan Principal Problem:   Acute blood loss anemia Active Problems:   CHF (congestive heart failure) (HCC)   Abdominal pain   Hypotension   OSA (obstructive sleep apnea)   Essential hypertension    1. Hypotension probably from a combination of dehydration and blood loss - at this time given the history of CHF we will not aggressively hydrate. Recheck hemoglobin. Hold antihypertensives. Check UA, lactic acid levels troponin CK and repeat metabolic panel and CBC. Will keep patient on empiric Azactam for now. 2. Acute renal failure probably secondary to hypotension and dehydration - hold antihypertensives and closely follow intake and output and metabolic panel. 3. History of combined systolic and diastolic CHF - patient appears mildly fluid overloaded but blood pressures in the low normal will keep off Lasix for now and if patient's breathing gets decompensated then may need Lasix with vasopressors. 4. Hypertension - holding all antihypertensives for now. 5. COPD - not actively wheezing. 6. Sleep apnea on CPAP.  Repeat labs including CBC, metabolic  panel, chest x-ray, UA EKG cardiac markers and CT abdomen and pelvis are pending.   DVT prophylaxis: SCDs. Code Status: Full code.  Family Communication: Discussed with patient's daughter.  Disposition Plan: Home.  Consults called: Dr. Tresa Moore. Urologist.  Admission status: Inpatient.    Rise Patience MD [facility] Pager [phone].  If 7PM-7AM, please contact night-coverage [email or web address] Password [email or web address]  01/01/2017, 11:00 PM

## 2017-01-02 ENCOUNTER — Inpatient Hospital Stay (HOSPITAL_COMMUNITY): Payer: Medicare Other

## 2017-01-02 LAB — BASIC METABOLIC PANEL
ANION GAP: 7 (ref 5–15)
BUN: 32 mg/dL — ABNORMAL HIGH (ref 6–20)
CO2: 26 mmol/L (ref 22–32)
Calcium: 8.7 mg/dL — ABNORMAL LOW (ref 8.9–10.3)
Chloride: 97 mmol/L — ABNORMAL LOW (ref 101–111)
Creatinine, Ser: 1.33 mg/dL — ABNORMAL HIGH (ref 0.61–1.24)
GFR calc Af Amer: 57 mL/min — ABNORMAL LOW (ref >60)
GFR, EST NON AFRICAN AMERICAN: 49 mL/min — AB (ref >60)
GLUCOSE: 121 mg/dL — AB (ref 65–99)
POTASSIUM: 4.4 mmol/L (ref 3.5–5.1)
Sodium: 130 mmol/L — ABNORMAL LOW (ref 135–145)

## 2017-01-02 LAB — CBC WITH DIFFERENTIAL/PLATELET
BASOS PCT: 0 %
Basophils Absolute: 0 10*3/uL (ref 0.0–0.1)
Basophils Absolute: 0 10*3/uL (ref 0.0–0.1)
Basophils Relative: 0 %
EOS ABS: 0 10*3/uL (ref 0.0–0.7)
EOS PCT: 0 %
Eosinophils Absolute: 0 10*3/uL (ref 0.0–0.7)
Eosinophils Relative: 0 %
HCT: 22.6 % — ABNORMAL LOW (ref 39.0–52.0)
HEMATOCRIT: 21.5 % — AB (ref 39.0–52.0)
HEMOGLOBIN: 7.1 g/dL — AB (ref 13.0–17.0)
Hemoglobin: 7.7 g/dL — ABNORMAL LOW (ref 13.0–17.0)
LYMPHS ABS: 0.7 10*3/uL (ref 0.7–4.0)
LYMPHS ABS: 0.6 10*3/uL — AB (ref 0.7–4.0)
LYMPHS PCT: 9 %
Lymphocytes Relative: 7 %
MCH: 30.1 pg (ref 26.0–34.0)
MCH: 30.9 pg (ref 26.0–34.0)
MCHC: 33 g/dL (ref 30.0–36.0)
MCHC: 34.1 g/dL (ref 30.0–36.0)
MCV: 91.1 fL (ref 78.0–100.0)
MCV: 90.8 fL (ref 78.0–100.0)
MONO ABS: 1 10*3/uL (ref 0.1–1.0)
MONOS PCT: 10 %
MONOS PCT: 12 %
Monocytes Absolute: 0.8 10*3/uL (ref 0.1–1.0)
NEUTROS ABS: 6.3 10*3/uL (ref 1.7–7.7)
NEUTROS ABS: 6.5 10*3/uL (ref 1.7–7.7)
NEUTROS PCT: 81 %
Neutrophils Relative %: 81 %
PLATELETS: 157 10*3/uL (ref 150–400)
Platelets: 158 10*3/uL (ref 150–400)
RBC: 2.36 MIL/uL — AB (ref 4.22–5.81)
RBC: 2.49 MIL/uL — AB (ref 4.22–5.81)
RDW: 18.1 % — ABNORMAL HIGH (ref 11.5–15.5)
RDW: 18 % — ABNORMAL HIGH (ref 11.5–15.5)
WBC: 7.9 10*3/uL (ref 4.0–10.5)
WBC: 8.1 10*3/uL (ref 4.0–10.5)

## 2017-01-02 LAB — RETICULOCYTES
RBC.: 4.12 MIL/uL — ABNORMAL LOW (ref 4.22–5.81)
RETIC COUNT ABSOLUTE: 90.6 10*3/uL (ref 19.0–186.0)
RETIC CT PCT: 2.2 % (ref 0.4–3.1)

## 2017-01-02 LAB — IRON AND TIBC
IRON: 17 ug/dL — AB (ref 45–182)
Saturation Ratios: 7 % — ABNORMAL LOW (ref 17.9–39.5)
TIBC: 244 ug/dL — ABNORMAL LOW (ref 250–450)
UIBC: 227 ug/dL

## 2017-01-02 LAB — VITAMIN B12: Vitamin B-12: 1483 pg/mL — ABNORMAL HIGH (ref 180–914)

## 2017-01-02 LAB — TSH: TSH: 1.492 u[IU]/mL (ref 0.350–4.500)

## 2017-01-02 LAB — TROPONIN I
Troponin I: 0.03 ng/mL (ref <0.03)
Troponin I: <0.03 ng/mL (ref <0.03)
Troponin I: 0.03 ng/mL (ref <0.03)

## 2017-01-02 LAB — MRSA PCR SCREENING: MRSA by PCR: NEGATIVE

## 2017-01-02 LAB — CK: Total CK: 32 U/L — ABNORMAL LOW (ref 49–397)

## 2017-01-02 LAB — FOLATE: Folate: 14.6 ng/mL (ref >5.9)

## 2017-01-02 LAB — AMMONIA: AMMONIA: 26 umol/L (ref 9–35)

## 2017-01-02 LAB — PREPARE RBC (CROSSMATCH)

## 2017-01-02 LAB — PROTIME-INR
INR: 1.35
Prothrombin Time: 16.7 seconds — ABNORMAL HIGH (ref 11.4–15.2)

## 2017-01-02 LAB — FERRITIN: FERRITIN: 59 ng/mL (ref 24–336)

## 2017-01-02 LAB — BRAIN NATRIURETIC PEPTIDE: B NATRIURETIC PEPTIDE 5: 384.2 pg/mL — AB (ref 0.0–100.0)

## 2017-01-02 MED ORDER — SODIUM CHLORIDE 0.9 % IV SOLN
Freq: Once | INTRAVENOUS | Status: AC
  Administered 2017-01-02: 11:00:00 via INTRAVENOUS

## 2017-01-02 MED ORDER — ORAL CARE MOUTH RINSE
15.0000 mL | Freq: Two times a day (BID) | OROMUCOSAL | Status: DC
  Administered 2017-01-02 – 2017-01-04 (×4): 15 mL via OROMUCOSAL

## 2017-01-02 MED ORDER — DEXTROSE 5 % IV SOLN
1.0000 g | Freq: Three times a day (TID) | INTRAVENOUS | Status: DC
  Administered 2017-01-02 – 2017-01-03 (×4): 1 g via INTRAVENOUS
  Filled 2017-01-02 (×5): qty 1

## 2017-01-02 MED ORDER — FUROSEMIDE 10 MG/ML IJ SOLN
40.0000 mg | Freq: Once | INTRAMUSCULAR | Status: AC
  Administered 2017-01-02: 40 mg via INTRAVENOUS
  Filled 2017-01-02: qty 4

## 2017-01-02 NOTE — Progress Notes (Signed)
Date:  January 02, 2017 Chart reviewed for concurrent status and case management needs. Will continue to follow patient progress./[facility] for [facility] Discharge Planning: following for needs Expected discharge date: [id number] Velva Harman, [place], [place], [place]

## 2017-01-02 NOTE — Consult Note (Signed)
Reason for Consult:Right Renal Pelvis Cancer, Symptomatic Anemia, Renal Insufficiency.   Referring Physician: Niel Hummer MD  Victor Wang is an 82 y.o. male.   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 Former smoker and cytoxan use. Most recently with   3-4 cm Rt renal pelvis enhancing mass worrisome for upper tract TCC on eva new gross hematuria. 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 Most recent Cr 1.4's.    3 - Prostatic Hyperplasia - s/p TURP previously years ago, re-TURP 08/2015 as part of TURBT of tumor arising from right prostatic lobe urothelium.   4 -  Chronic Blood Loss Anemia - Hgb 7's 12/2016 with increased fatigue, somnolence. Has right renal pelvis cancer with chronic small volume bleeding as per above. He is quite sensitive to anemia having similar episode previously 2017 with symptoms resolved when Hgb >10.   PMH sig for significant obesity, HTN, Appy, Progressive COPD/obesity hypoventilation (O2 at times, CPAP). Denies CVA or ischemic heart disease. His PCP is Dr Ula Lingo   Today "Victor Wang" is stable. Some dark colored urine w/o bright blood. Htb approx stable, no CP. Does admit to fatigue / somnolence much worse than baseline.   Past Medical History:  Diagnosis Date  . Anemia   . Arthritis   . Bladder cancer (HCC)    RECURRENT  . BPH (benign prostatic hypertrophy)   . CHF (congestive heart failure) ([place])   . Clot 2007    kidney biopsy done  . Complication of anesthesia    oxygen level drops when put to sleep , has some bleeding issues after every surgery with cautery required  . COPD (chronic obstructive pulmonary disease) ([place])   . Dyspnea    exertion; can not complete a flight of stairs  without stopping to catch his breath   . Full dentures   . GERD (gastroesophageal reflux disease)   . Headache   . History of blood transfusion last done 11-13-16   total of 7 units   . History of compression fracture of spine    04/2013   T12  . Hyperlipidemia   . Hypertension   . Nephritis 2007   oral chemo and prednisone done  . Pneumonia last 2016   x 2 in 2016  . PONV (postoperative nausea and vomiting)   . Pulmonary hypertension    mild per 12-15-15 echo [facility]  . Renal mass    right  . Sleep apnea     Past Surgical History:  Procedure Laterality Date  . APPENDECTOMY  1985  . CARPAL TUNNEL RELEASE Right 02-05-2008  . CYSTOSCOPY W/ RETROGRADES Bilateral 10/29/2014   Procedure: CYSTOSCOPY WITH RETROGRADE PYELOGRAM;  Surgeon: Alexis Frock, MD;  Location: [facility];  Service: Urology;  Laterality: Bilateral;  . CYSTOSCOPY W/ RETROGRADES Bilateral 09/10/2015   Procedure: CYSTOSCOPY WITH RETROGRADE PYELOGRAM;  Surgeon: Alexis Frock, MD;  Location: [facility];  Service: Urology;  Laterality: Bilateral;  . CYSTOSCOPY WITH BIOPSY N/A 05/26/2014   Procedure: CYSTOSCOPY WITH BIOPSY;  Surgeon: Sharyn Creamer, MD;  Location: [facility];  Service: Urology;  Laterality: N/A;  . CYSTOSCOPY WITH RETROGRADE PYELOGRAM, URETEROSCOPY AND STENT PLACEMENT Bilateral 04/09/2014   Procedure: CYSTOSCOPY WITH RETROGRADE  PYELOGRAM, POSSIBLE URETEROSCOPY WITH BIOPSY AND STENT PLACEMENT;  Surgeon: Sharyn Creamer, MD;  Location: [facility];  Service: Urology;  Laterality: Bilateral;  . CYSTOSCOPY/RETROGRADE/URETEROSCOPY Right 12/28/2016   Procedure: CYSTOSCOPY/RETROGRADE/URETEROSCOPY WITH BIOPSY right renal pelvis insertion double j stent;  Surgeon: Alexis Frock, MD;  Location: [facility];  Service: Urology;  Laterality: Right;  . KNEE LIGAMENT RECONSTRUCTION Right 1970  . RIGHT SHOULDER SURGERY  2011  . TONSILLECTOMY AND  ADENOIDECTOMY  CHILD   and adenoids  . TRANSURETHRAL RESECTION OF BLADDER TUMOR WITH GYRUS (TURBT-GYRUS) Bilateral 04/09/2014   Procedure: TRANSURETHRAL RESECTION OF BLADDER TUMOR WITH GYRUS (TURBT-GYRUS);  Surgeon: Sharyn Creamer, MD;  Location: [facility];  Service: Urology;  Laterality: Bilateral;  . TRANSURETHRAL RESECTION OF BLADDER TUMOR WITH GYRUS (TURBT-GYRUS) N/A 10/29/2014   Procedure: TRANSURETHRAL RESECTION OF BLADDER TUMOR WITH GYRUS (TURBT-GYRUS);  Surgeon: Alexis Frock, MD;  Location: [facility];  Service: Urology;  Laterality: N/A;  . TRANSURETHRAL RESECTION OF BLADDER TUMOR WITH GYRUS (TURBT-GYRUS) N/A 09/10/2015   Procedure: TRANSURETHRAL RESECTION OF BLADDER TUMOR ;  Surgeon: Alexis Frock, MD;  Location: [facility];  Service: Urology;  Laterality: N/A;  . TRANSURETHRAL RESECTION OF PROSTATE  2006   AND    POST CAURTERIZATION OF BLEEDERS  . TRANSURETHRAL RESECTION OF PROSTATE N/A 09/10/2015   Procedure: TRANSURETHRAL RESECTION OF THE PROSTATE ;  Surgeon: Alexis Frock, MD;  Location: [facility];  Service: Urology;  Laterality: N/A;    Family History  Problem Relation Age of Onset  . Congestive Heart Failure Mother   . Bladder Cancer Neg Hx     Social History:  reports that he quit smoking about 31 years ago. His smoking use included Cigarettes. He has a 52.50 pack-year smoking history. He has never used smokeless tobacco. He reports that he drinks about 2.4 oz of alcohol per week . He reports that he does not use drugs.  Allergies:  Allergies  Allergen Reactions  . Bee Venom Anaphylaxis  . Penicillins Anaphylaxis, Hives and Other (See Comments)    Has patient had a PCN reaction causing immediate rash, facial/tongue/throat swelling, SOB or lightheadedness with hypotension: Yes Has patient had a PCN reaction causing severe rash involving mucus membranes or skin necrosis: Yes Has patient had a PCN  reaction that required hospitalization No Has patient had a PCN reaction occurring within the last 10 years: No If all of the above answers are "NO", then may proceed with Cephalosporin use.   . Tramadol Other (See Comments)    Dizzy and loopy.     Medications: I have reviewed the patient's current medications.  Results for orders placed or performed during the hospital encounter of 01/01/17 (from the past 48 hour(s))  Lactic acid, plasma     Status: None   Collection Time: 01/01/17 10:40 PM  Result Value Ref Range   Lactic Acid, Venous 1.2 0.5 - 1.9 mmol/L  Type and screen [place]     Status: None   Collection Time: 01/01/17 10:40 PM  Result Value Ref Range   ABO/RH(D) A POS    Antibody Screen NEG    Sample Expiration 01/04/2017   Troponin I     Status: Abnormal   Collection Time: 01/01/17 10:40 PM  Result Value Ref Range   Troponin I 0.03 (HH) <0.03 ng/mL    Comment: CRITICAL RESULT CALLED TO, READ BACK BY AND VERIFIED WITHBarnetta Chapel [facility] RN [id number] 01/02/17 A NAVARRO  CK     Status: Abnormal   Collection Time: 01/01/17 10:40 PM  Result Value Ref Range   Total CK 32 (L) 49 - 397 U/L  Comprehensive metabolic panel     Status: Abnormal   Collection Time: 01/01/17 10:48 PM  Result Value Ref Range   Sodium 124 (L) 135 - 145 mmol/L   Potassium 4.6 3.5 - 5.1 mmol/L   Chloride 93 (L) 101 - 111 mmol/L   CO2 25 22 - 32 mmol/L   Glucose, Bld 131 (H) 65 - 99 mg/dL   BUN 37 (H) 6 - 20 mg/dL   Creatinine, Ser 1.48 (H) 0.61 - 1.24 mg/dL   Calcium 8.0 (L) 8.9 - 10.3 mg/dL   Total Protein 5.5 (L) 6.5 - 8.1 g/dL   Albumin 2.6 (L) 3.5 - 5.0 g/dL   AST 35 15 - 41 U/L   ALT 18 17 - 63 U/L   Alkaline Phosphatase 118 38 - 126 U/L   Total Bilirubin 1.4 (H) 0.3 - 1.2 mg/dL   GFR calc non Af Amer 43 (L) >60 mL/min   GFR calc Af Amer 50 (L) >60 mL/min    Comment: (NOTE) The eGFR has been calculated using the CKD EPI equation. This calculation has not been validated in all  clinical situations. eGFR's persistently <60 mL/min signify possible Chronic Kidney Disease.    Anion gap 6 5 - 15  CBC with Differential/Platelet     Status: Abnormal   Collection Time: 01/01/17 10:48 PM  Result Value Ref Range   WBC 8.8 4.0 - 10.5 K/uL   RBC 2.42 (L) 4.22 - 5.81 MIL/uL   Hemoglobin 7.3 (L) 13.0 - 17.0 g/dL   HCT 22.0 (L) 39.0 - 52.0 %   MCV 90.9 78.0 - 100.0 fL   MCH 30.2 26.0 - 34.0 pg   MCHC 33.2 30.0 - 36.0 g/dL   RDW 18.1 (H) 11.5 - 15.5 %   Platelets 168 150 - 400 K/uL   Neutrophils Relative % 85 %   Neutro Abs 7.6 1.7 - 7.7 K/uL   Lymphocytes Relative 5 %   Lymphs Abs 0.4 (L) 0.7 - 4.0 K/uL   Monocytes Relative 10 %   Monocytes Absolute 0.8 0.1 - 1.0 K/uL   Eosinophils Relative 0 %   Eosinophils Absolute 0.0 0.0 - 0.7 K/uL   Basophils Relative 0 %   Basophils Absolute 0.0 0.0 - 0.1 K/uL  Urinalysis, Routine w reflex microscopic     Status: Abnormal   Collection Time: 01/01/17 11:07 PM  Result Value Ref Range   Color, Urine YELLOW YELLOW   APPearance CLOUDY (A) CLEAR   Specific Gravity, Urine 1.011 1.005 - 1.030   pH 5.0 5.0 - 8.0   Glucose, UA NEGATIVE NEGATIVE mg/dL   Hgb urine dipstick LARGE (A) NEGATIVE   Bilirubin Urine NEGATIVE NEGATIVE   Ketones, ur NEGATIVE NEGATIVE mg/dL   Protein, ur 30 (A) NEGATIVE mg/dL   Nitrite NEGATIVE NEGATIVE   Leukocytes, UA LARGE (A) NEGATIVE   RBC / HPF TOO NUMEROUS TO COUNT 0 - 5 RBC/hpf   WBC, UA TOO NUMEROUS TO COUNT 0 - 5 WBC/hpf   Bacteria, UA MANY (A) NONE SEEN   Squamous Epithelial / LPF 0-5 (A) NONE SEEN   WBC Clumps PRESENT   MRSA PCR Screening     Status: None   Collection Time: 01/01/17 11:07 PM  Result Value Ref Range   MRSA by PCR NEGATIVE NEGATIVE    Comment:  The GeneXpert MRSA Assay (FDA approved for NASAL specimens only), is one component of a comprehensive MRSA colonization surveillance program. It is not intended to diagnose MRSA infection nor to guide or monitor treatment  for MRSA infections.   CBC with Differential/Platelet     Status: Abnormal   Collection Time: 01/02/17  2:50 AM  Result Value Ref Range   WBC 7.9 4.0 - 10.5 K/uL   RBC 2.36 (L) 4.22 - 5.81 MIL/uL   Hemoglobin 7.1 (L) 13.0 - 17.0 g/dL   HCT 21.5 (L) 39.0 - 52.0 %   MCV 91.1 78.0 - 100.0 fL   MCH 30.1 26.0 - 34.0 pg   MCHC 33.0 30.0 - 36.0 g/dL   RDW 18.1 (H) 11.5 - 15.5 %   Platelets 158 150 - 400 K/uL   Neutrophils Relative % 81 %   Neutro Abs 6.3 1.7 - 7.7 K/uL   Lymphocytes Relative 9 %   Lymphs Abs 0.7 0.7 - 4.0 K/uL   Monocytes Relative 10 %   Monocytes Absolute 0.8 0.1 - 1.0 K/uL   Eosinophils Relative 0 %   Eosinophils Absolute 0.0 0.0 - 0.7 K/uL   Basophils Relative 0 %   Basophils Absolute 0.0 0.0 - 0.1 K/uL  TSH     Status: None   Collection Time: 01/02/17  2:50 AM  Result Value Ref Range   TSH 1.492 0.350 - 4.500 uIU/mL    Comment: Performed by a 3rd Generation assay with a functional sensitivity of <=0.01 uIU/mL.  CBC with Differential/Platelet     Status: Abnormal   Collection Time: 01/02/17  6:36 AM  Result Value Ref Range   WBC 8.1 4.0 - 10.5 K/uL    Comment: WHITE COUNT CONFIRMED ON SMEAR   RBC 2.49 (L) 4.22 - 5.81 MIL/uL   Hemoglobin 7.7 (L) 13.0 - 17.0 g/dL   HCT 22.6 (L) 39.0 - 52.0 %   MCV 90.8 78.0 - 100.0 fL   MCH 30.9 26.0 - 34.0 pg   MCHC 34.1 30.0 - 36.0 g/dL   RDW 18.0 (H) 11.5 - 15.5 %   Platelets 157 150 - 400 K/uL   Neutrophils Relative % 81 %   Lymphocytes Relative 7 %   Monocytes Relative 12 %   Eosinophils Relative 0 %   Basophils Relative 0 %   Neutro Abs 6.5 1.7 - 7.7 K/uL   Lymphs Abs 0.6 (L) 0.7 - 4.0 K/uL   Monocytes Absolute 1.0 0.1 - 1.0 K/uL   Eosinophils Absolute 0.0 0.0 - 0.7 K/uL   Basophils Absolute 0.0 0.0 - 0.1 K/uL  Basic metabolic panel     Status: Abnormal   Collection Time: 01/02/17  6:36 AM  Result Value Ref Range   Sodium 130 (L) 135 - 145 mmol/L   Potassium 4.4 3.5 - 5.1 mmol/L   Chloride 97 (L) 101 - 111  mmol/L   CO2 26 22 - 32 mmol/L   Glucose, Bld 121 (H) 65 - 99 mg/dL   BUN 32 (H) 6 - 20 mg/dL   Creatinine, Ser 1.33 (H) 0.61 - 1.24 mg/dL   Calcium 8.7 (L) 8.9 - 10.3 mg/dL   GFR calc non Af Amer 49 (L) >60 mL/min   GFR calc Af Amer 57 (L) >60 mL/min    Comment: (NOTE) The eGFR has been calculated using the CKD EPI equation. This calculation has not been validated in all clinical situations. eGFR's persistently <60 mL/min signify possible Chronic Kidney Disease.    Anion  gap 7 5 - 15  Troponin I (q 6hr x 3)     Status: None   Collection Time: 01/02/17  6:36 AM  Result Value Ref Range   Troponin I <0.03 <0.03 ng/mL    Ct Abdomen Pelvis Wo Contrast  Result Date: 01/02/2017 CLINICAL DATA:  Right flank pain, evaluate for hematoma, status post cystoscopy with double-J stent EXAM: CT ABDOMEN AND PELVIS WITHOUT CONTRAST TECHNIQUE: Multidetector CT imaging of the abdomen and pelvis was performed following the standard protocol without IV contrast. COMPARISON:  11/17/2016, ultrasound 01/01/2017, MRI 12/16/2014 FINDINGS: Lower chest: Trace right-sided pleural effusion. Patchy atelectasis within the right greater than left lung bases. Coronary artery calcification. Hepatobiliary: Nodular liver contour consistent with cirrhosis. Scattered subcentimeter hypodense lesions too small to characterize. Vague peripheral right hepatic lobe hypodensity, likely corresponds to MRI demonstrated probable hemangioma. No biliary dilatation. Multiple calcified gallstones Pancreas: No surrounding inflammation. Vague hypodense lesion mid pancreatic body may correspond to MRI suggested abnormality. Spleen: Enlarged at 18 cm. Adrenals/Urinary Tract: Adrenal glands within normal limits. Hypodense mass within the central right kidney measuring 4.4 cm, grossly unchanged. Placement of ureteral stent on the right with proximal portion overlying the renal pelvis and distal portion visualized within the left aspect of the bladder.  Bladder demonstrates a small focus of gas anteriorly. No wall thickening. No significant perinephric fluid collection. No hydronephrosis on the left. 11 mm exophytic density posterior cortex left kidney stable compared to MRI. Stomach/Bowel: Stomach nonenlarged. No dilated small or large bowel. No colon wall thickening. Vascular/Lymphatic: Tortuous vascular structures adjacent to the spleen consistent with varices. Atherosclerotic vascular calcification. Mild aneurysmal dilatation of the right common iliac artery, unchanged. No grossly enlarged lymph nodes. Reproductive: No masses or abnormalities. Other: No free air or free fluid. Mild hazy edema within the retroperitoneum. No evidence for retroperitoneal hematoma. Musculoskeletal: Stable compression deformities of T12 and L3. Multilevel vacuum disc. IMPRESSION: 1. No perinephric fluid collections or evidence for retroperitoneal hematoma 2. Stable 4.4 cm mass within the central right kidney. Placement of a right-sided ureteral stent as described above. 3. Cirrhosis. Portal hypertension with splenomegaly and left upper quadrant varices 4. Gallstones 5. Stable compression deformities of T12 and L3 Electronically Signed   By: Donavan Foil M.D.   On: 01/02/2017 01:05   Dg Chest Port 1 View  Result Date: 01/01/2017 CLINICAL DATA:  Right-sided flank pain EXAM: PORTABLE CHEST 1 VIEW COMPARISON:  01/01/2017 FINDINGS: Portable semi-erect view of the chest. Coarse interstitial opacities at the bilateral lung bases, suspect chronic change. Hazy right basilar atelectasis. No large effusion. Stable cardiomegaly and atherosclerosis. No pneumothorax visualized. IMPRESSION: 1. Stable cardiomegaly without overt failure 2. Hazy atelectasis at the right lung base. Electronically Signed   By: Donavan Foil M.D.   On: 01/01/2017 23:50    Review of Systems  Constitutional: Positive for malaise/fatigue. Negative for chills and fever.  HENT: Negative.   Eyes: Negative.    Respiratory: Negative.   Cardiovascular: Negative.  Negative for chest pain.  Gastrointestinal: Negative.   Genitourinary: Positive for hematuria. Negative for flank pain.  Musculoskeletal: Negative.   Skin: Negative.   Neurological: Positive for dizziness and weakness. Negative for loss of consciousness.  Endo/Heme/Allergies: Negative.   Psychiatric/Behavioral: Negative.    Blood pressure 139/71, pulse (!) 102, temperature 98.1 F (36.7 C), temperature source Oral, resp. rate 20, height 6' 1"  (1.854 m), weight (!) 144.3 kg (318 lb 2 oz), SpO2 95 %. Physical Exam  Constitutional: He is oriented to person, place,  and time. He appears well-developed.  Tired, but otherwise at recent baseline. Family at bedside.   HENT:  Head: Normocephalic.  Eyes: Pupils are equal, round, and reactive to light.  Neck: Normal range of motion.  Cardiovascular: Normal rate.   Respiratory: Effort normal.  GI: Soft.  Stable morbid truncal obesity  Genitourinary: Penis normal.  Genitourinary Comments: No CVAT at present.   Neurological: He is alert and oriented to person, place, and time.  Skin: Skin is warm.  Psychiatric: He has a normal mood and affect.    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 His volume of disease is not amenable to complete endoscopic resection. He understands he is risky operative candidate, but given alternatives he would like to proceed. Without nephrectomy he will likely be in / out hospital indefinitely with anemia and also have eventual oncologic progression.   Will begin logistics of scheduling.    2 - Chronic Renal Insufficiency - GFR stable.    3 - Prostatic Hyperplasia - doing well s/p prior TURP  4 -  Chronic Blood Loss Anemia - likely slow intermittent bleed from renal tumor above.  I feel transfusion certainly reasonable with goal Hgb 10s given his subjective sensitivity to anemia, and fact he will likely continue to have some ongoing slow loss for awhile. Fortunately his volume status seems good / not overloaded at present.  Please call me directly with questions anytime.    Victor Wang 01/02/2017, 7:44 AM

## 2017-01-02 NOTE — Progress Notes (Addendum)
Pharmacy Antibiotic Note  Victor Wang is a 82 y.o. male admitted on 01/01/2017 with fall and right flank pain s/p right kidney biopsy and stent placement on 1/31.  Pharmacy has been consulted for aztreonam dosing for UTI.  Plan: Aztreonam 1g IV q8h.   Follow up urine culture results.  Patient has tolerated Ceftriaxone in past per chart review.  Could consider switch to Ceftriaxone.  Height: '6\' 1"'$  (185.4 cm) Weight: (!) 318 lb 2 oz (144.3 kg) IBW/kg (Calculated) : 79.9  Temp (24hrs), Avg:98.2 F (36.8 C), Min:98.1 F (36.7 C), Max:98.3 F (36.8 C)   Recent Labs Lab 01/01/17 2240 01/01/17 2248 01/02/17 0250 01/02/17 0636  WBC  --  8.8 7.9 8.1  CREATININE  --  1.48*  --  1.33*  LATICACIDVEN 1.2  --   --   --     Estimated Creatinine Clearance: 67.3 mL/min (by C-G formula based on SCr of 1.33 mg/dL (H)).    Allergies  Allergen Reactions  . Bee Venom Anaphylaxis  . Penicillins Anaphylaxis, Hives and Other (See Comments)    Has patient had a PCN reaction causing immediate rash, facial/tongue/throat swelling, SOB or lightheadedness with hypotension: Yes Has patient had a PCN reaction causing severe rash involving mucus membranes or skin necrosis: Yes Has patient had a PCN reaction that required hospitalization No Has patient had a PCN reaction occurring within the last 10 years: No If all of the above answers are "NO", then may proceed with Cephalosporin use.   . Tramadol Other (See Comments)    Dizzy and loopy.     Antimicrobials this admission: 2/5 aztreonam >>   Dose adjustments this admission:  Microbiology results: 2/5 UCx: sent  2/4 MRSA PCR: neg  Thank you for allowing pharmacy to be a part of this patient's care.  Hershal Coria 01/02/2017 8:11 AM

## 2017-01-02 NOTE — Progress Notes (Signed)
CRITICAL VALUE ALERT  Critical value received:  Troponin 0.03  Date of notification:  01/02/17  Time of notification:  11:45  Critical value read back:Yes.    Nurse who received alert:  Juel Burrow, RN  MD notified (1st page):  Dr. Tyrell Antonio  Time of first page:  11:45  MD notified (2nd page):  Time of second page:

## 2017-01-02 NOTE — Progress Notes (Addendum)
PROGRESS NOTE    TURON KILMER  EUM:[id number] DOB: [birth year] DOA: 01/01/2017 PCP: Townsend Roger, MD   Brief Narrative: Victor Wang is a 82 y.o. male with history of combined systolic and diastolic CHF, hypertension, COPD, sleep apnea, chronic kidney disease and recent right kidney biopsy and stent placement by Dr. Tresa Moore last week presents to the ER at [facility] due to persistent weakness. Patient stated after procedure he was doing well and the following day he had a fall and has been having persistent pain in the right flank area. Patient also had been having ongoing hematuria which has gradually improved but still persists. Denies any nausea vomiting or diarrhea chest pain or shortness of breath. In the ER at [facility] patient was found to be hypotensive with a blood pressure 90 systolic hemoglobin was around 7.1 and patient had hematuria. Chest x-ray was showing congestion creatinine of 1.7 which was increased from baseline of normal last week. Patient was given 500 mL fluid bolus and 1 unit of PRBC and transferred to [facility]. On my exam patient denies any chest pain and is not in distress. Complains of right flank pain. Renal sonogram done at [facility] did not show anything acute.     Assessment & Plan:   Principal Problem:   Acute blood loss anemia Active Problems:   CHF (congestive heart failure) (HCC)   Abdominal pain   Hypotension   OSA (obstructive sleep apnea)   Essential hypertension  1-Hypotension;   Suspect hypovolemia, anemia.  Improved.  Lactic acid at 1.2. Continue to hold BP medications.  Blood transfusion. Would hold on IV fluids.  Covering for UTI, UA with too numerous to count WBC.   Acute blood loss anemia on chronic.  Suspect related to hematuria.  CT abdomen negative for retroperitoneal bleed.  Check anemia panel  Transfuse 2 more units PRBC. 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  Dr Tresa Moore following. Plan for sx at some point.   Hyponatremia; improved.   UTI; continue with aztreonam.  Follow urine culture.   AKI; on CKD stage II, improving.  Cr peak to 1.7. It has decreased to 1.3.  Holding cozaar.   Cirrhosis of liver by CT scan;  Will request records from his gastroenterology.  Check ammonia level, and viral hepatitis panel.  Check INR   combined systolic and diastolic CHF  IV lasix in between transfusion.  Resume oral lasix when BP allows it.  No ECHO available. Will request records.    COPD; nebulizer PRN.   Sleep apnea;     DVT prophylaxis: SCD  Code Status: full code.  Family Communication: Daughter and wife who were at bedside.  Disposition Plan: remain in the step down unit, Blood transfusion., IV lasix.   Consultants:   Dr Tresa Moore    Procedures: none  Antimicrobials:  Aztreonam 01-02-2017  Subjective: Patient report falling prior to admission. He hit right side of abdomen. Pain worse when he takes deep breath.  Denies dyspnea. Still feels weak and tired.  He is sleepy, per daughter he has not been able to sleep since yesterday   Objective: Vitals:   01/02/17 0200 01/02/17 0335 01/02/17 0400 01/02/17 0600  BP: 122/68  (!) 146/76 139/71  Pulse: 96  95 (!) 102  Resp: '20  20 20  '$ Temp:  98.1 F (36.7 C)    TempSrc:  Oral    SpO2: 93%  95% 95%  Weight:    (!) 144.3 kg (318 lb 2 oz)  Height:        Intake/Output Summary (Last 24 hours) at 01/02/17 0829 Last data filed at 01/02/17 0600  Gross per 24 hour  Intake              360 ml  Output              600 ml  Net             -240 ml   Filed Weights   01/01/17 2300 01/02/17 0600  Weight: (!) 144.3 kg (318 lb 2 oz) (!) 144.3 kg (318 lb 2 oz)     Examination:  General exam: Appears calm and comfortable , sleepy  Respiratory system: Clear to auscultation. Respiratory effort normal. Cardiovascular system: S1 & S2 heard, RRR. No JVD, murmurs, rubs, gallops or clicks. No pedal edema. Gastrointestinal system: Abdomen is nondistended, soft and nontender. No organomegaly or masses felt. Normal bowel sounds heard. Central nervous system:Sleepy and oriented. No focal neurological deficits. Extremities: Symmetric 5 x 5 power. Skin: No rashes, lesions or ulcers Psychiatry: Judgement and insight appear normal. Mood & affect appropriate.     Data Reviewed: I have personally reviewed following labs and imaging studies  CBC:  Recent Labs Lab 01/01/17 2248 01/02/17 0250 01/02/17 0636  WBC 8.8 7.9 8.1  NEUTROABS 7.6 6.3 6.5  HGB 7.3* 7.1* 7.7*  HCT 22.0* 21.5* 22.6*  MCV 90.9 91.1 90.8  PLT 168 158 093   Basic Metabolic Panel:  Recent Labs Lab 01/01/17 2248 01/02/17 0636  NA 124* 130*  K 4.6 4.4  CL 93* 97*  CO2 25 26  GLUCOSE 131* 121*  BUN 37* 32*  CREATININE 1.48* 1.33*  CALCIUM 8.0* 8.7*   GFR: Estimated Creatinine Clearance: 67.3 mL/min (by C-G formula based on SCr of 1.33 mg/dL (H)). Liver Function Tests:  Recent Labs Lab 01/01/17 2248  AST 35  ALT 18  ALKPHOS 118  BILITOT 1.4*  PROT 5.5*  ALBUMIN 2.6*   No results for input(s): LIPASE, AMYLASE in the last 168 hours. No results for input(s): AMMONIA in the last 168 hours. Coagulation Profile: No results for input(s): INR, PROTIME in the last 168 hours. Cardiac Enzymes:  Recent Labs Lab 01/01/17 2240 01/02/17 0636  CKTOTAL 32*  --   TROPONINI 0.03* <0.03   BNP (last 3 results) No results for input(s): PROBNP in the last 8760 hours. HbA1C: No results for input(s): HGBA1C in the last 72 hours. CBG: No results for input(s): GLUCAP in the last 168 hours. Lipid Profile: No results for input(s): CHOL, HDL, LDLCALC, TRIG, CHOLHDL, LDLDIRECT in  the last 72 hours. Thyroid Function Tests:  Recent Labs  01/02/17 0250  TSH 1.492   Anemia Panel: No results for input(s): VITAMINB12, FOLATE, FERRITIN, TIBC, IRON, RETICCTPCT in the last 72 hours. Sepsis Labs:  Recent Labs Lab 01/01/17 2240  LATICACIDVEN 1.2    Recent Results (from the past 240 hour(s))  MRSA PCR Screening     Status: None   Collection Time: 01/01/17 11:07 PM  Result Value Ref Range Status   MRSA by PCR NEGATIVE NEGATIVE Final    Comment:        The GeneXpert MRSA Assay (FDA approved for NASAL specimens only),  is one component of a comprehensive MRSA colonization surveillance program. It is not intended to diagnose MRSA infection nor to guide or monitor treatment for MRSA infections.          Radiology Studies: Ct Abdomen Pelvis Wo Contrast  Result Date: 01/02/2017 CLINICAL DATA:  Right flank pain, evaluate for hematoma, status post cystoscopy with double-J stent EXAM: CT ABDOMEN AND PELVIS WITHOUT CONTRAST TECHNIQUE: Multidetector CT imaging of the abdomen and pelvis was performed following the standard protocol without IV contrast. COMPARISON:  11/17/2016, ultrasound 01/01/2017, MRI 12/16/2014 FINDINGS: Lower chest: Trace right-sided pleural effusion. Patchy atelectasis within the right greater than left lung bases. Coronary artery calcification. Hepatobiliary: Nodular liver contour consistent with cirrhosis. Scattered subcentimeter hypodense lesions too small to characterize. Vague peripheral right hepatic lobe hypodensity, likely corresponds to MRI demonstrated probable hemangioma. No biliary dilatation. Multiple calcified gallstones Pancreas: No surrounding inflammation. Vague hypodense lesion mid pancreatic body may correspond to MRI suggested abnormality. Spleen: Enlarged at 18 cm. Adrenals/Urinary Tract: Adrenal glands within normal limits. Hypodense mass within the central right kidney measuring 4.4 cm, grossly unchanged. Placement of ureteral  stent on the right with proximal portion overlying the renal pelvis and distal portion visualized within the left aspect of the bladder. Bladder demonstrates a small focus of gas anteriorly. No wall thickening. No significant perinephric fluid collection. No hydronephrosis on the left. 11 mm exophytic density posterior cortex left kidney stable compared to MRI. Stomach/Bowel: Stomach nonenlarged. No dilated small or large bowel. No colon wall thickening. Vascular/Lymphatic: Tortuous vascular structures adjacent to the spleen consistent with varices. Atherosclerotic vascular calcification. Mild aneurysmal dilatation of the right common iliac artery, unchanged. No grossly enlarged lymph nodes. Reproductive: No masses or abnormalities. Other: No free air or free fluid. Mild hazy edema within the retroperitoneum. No evidence for retroperitoneal hematoma. Musculoskeletal: Stable compression deformities of T12 and L3. Multilevel vacuum disc. IMPRESSION: 1. No perinephric fluid collections or evidence for retroperitoneal hematoma 2. Stable 4.4 cm mass within the central right kidney. Placement of a right-sided ureteral stent as described above. 3. Cirrhosis. Portal hypertension with splenomegaly and left upper quadrant varices 4. Gallstones 5. Stable compression deformities of T12 and L3 Electronically Signed   By: Donavan Foil M.D.   On: 01/02/2017 01:05   Dg Chest Port 1 View  Result Date: 01/01/2017 CLINICAL DATA:  Right-sided flank pain EXAM: PORTABLE CHEST 1 VIEW COMPARISON:  01/01/2017 FINDINGS: Portable semi-erect view of the chest. Coarse interstitial opacities at the bilateral lung bases, suspect chronic change. Hazy right basilar atelectasis. No large effusion. Stable cardiomegaly and atherosclerosis. No pneumothorax visualized. IMPRESSION: 1. Stable cardiomegaly without overt failure 2. Hazy atelectasis at the right lung base. Electronically Signed   By: Donavan Foil M.D.   On: 01/01/2017 23:50         Scheduled Meds: . allopurinol  100 mg Oral Daily  . aztreonam  1 g Intravenous Q8H  . ferrous sulfate  325 mg Oral BID WC  . pantoprazole  40 mg Oral Daily  . pravastatin  80 mg Oral Daily   Continuous Infusions: . sodium chloride 10 mL (01/02/17 0600)     LOS: 1 day    Time spent: 35 minutes.     Elmarie Shiley, MD [facility] Pager [phone]  If 7PM-7AM, please contact night-coverage [email or web address] Password [email or web address] 01/02/2017, 8:29 AM

## 2017-01-03 ENCOUNTER — Other Ambulatory Visit: Payer: Self-pay | Admitting: Urology

## 2017-01-03 LAB — BASIC METABOLIC PANEL
Anion gap: 7 (ref 5–15)
BUN: 29 mg/dL — AB (ref 6–20)
CALCIUM: 8.7 mg/dL — AB (ref 8.9–10.3)
CO2: 27 mmol/L (ref 22–32)
CREATININE: 1.08 mg/dL (ref 0.61–1.24)
Chloride: 99 mmol/L — ABNORMAL LOW (ref 101–111)
GFR calc non Af Amer: >60 mL/min (ref >60)
Glucose, Bld: 125 mg/dL — ABNORMAL HIGH (ref 65–99)
Potassium: 4.3 mmol/L (ref 3.5–5.1)
SODIUM: 133 mmol/L — AB (ref 135–145)

## 2017-01-03 LAB — TYPE AND SCREEN
Blood Product Expiration Date: 201802102359
Blood Product Expiration Date: 201802212359
ISSUE DATE / TIME: 201802051055
ISSUE DATE / TIME: 201802051452
Unit Type and Rh: 6200
Unit Type and Rh: 6200

## 2017-01-03 LAB — CBC
HCT: 26.8 % — ABNORMAL LOW (ref 39.0–52.0)
HEMOGLOBIN: 9.1 g/dL — AB (ref 13.0–17.0)
MCH: 30.8 pg (ref 26.0–34.0)
MCHC: 34 g/dL (ref 30.0–36.0)
MCV: 90.8 fL (ref 78.0–100.0)
PLATELETS: 157 10*3/uL (ref 150–400)
RBC: 2.95 MIL/uL — ABNORMAL LOW (ref 4.22–5.81)
RDW: 19.2 % — AB (ref 11.5–15.5)
WBC: 7.9 10*3/uL (ref 4.0–10.5)

## 2017-01-03 LAB — HEPATITIS PANEL, ACUTE
HEP A IGM: NEGATIVE
HEP B C IGM: NEGATIVE
HEP B S AG: NEGATIVE

## 2017-01-03 LAB — GLUCOSE, CAPILLARY: Glucose-Capillary: 127 mg/dL — ABNORMAL HIGH (ref 65–99)

## 2017-01-03 LAB — URINE CULTURE: Culture: NO GROWTH

## 2017-01-03 MED ORDER — POLYETHYLENE GLYCOL 3350 17 G PO PACK
17.0000 g | PACK | Freq: Every day | ORAL | Status: DC
  Administered 2017-01-03 – 2017-01-04 (×2): 17 g via ORAL
  Filled 2017-01-03 (×2): qty 1

## 2017-01-03 MED ORDER — FUROSEMIDE 40 MG PO TABS: 40.0000 mg | ORAL_TABLET | Freq: Every day | ORAL | Status: DC

## 2017-01-03 MED ORDER — FUROSEMIDE 40 MG PO TABS: 40.0000 mg | ORAL_TABLET | Freq: Two times a day (BID) | ORAL | Status: DC

## 2017-01-03 MED ORDER — AZTREONAM IN DEXTROSE 1 GM/50ML IV SOLN
1.0000 g | Freq: Three times a day (TID) | INTRAVENOUS | Status: DC
  Administered 2017-01-03 – 2017-01-04 (×2): 1 g via INTRAVENOUS
  Filled 2017-01-03 (×3): qty 50

## 2017-01-03 MED ORDER — METOPROLOL SUCCINATE ER 25 MG PO TB24
50.0000 mg | ORAL_TABLET | Freq: Every day | ORAL | Status: DC
  Administered 2017-01-04: 50 mg via ORAL
  Filled 2017-01-03 (×2): qty 2

## 2017-01-03 MED ORDER — FERUMOXYTOL INJECTION 510 MG/17 ML
510.0000 mg | Freq: Once | INTRAVENOUS | Status: AC
  Administered 2017-01-03: 510 mg via INTRAVENOUS
  Filled 2017-01-03: qty 17

## 2017-01-03 NOTE — Progress Notes (Signed)
PROGRESS NOTE    Victor Wang  YHC:[id number] DOB: 1934-12-18 DOA: 01/01/2017 PCP: Townsend Roger, MD   Brief Narrative: Victor Wang is a 82 y.o. male with history of combined systolic and diastolic CHF, hypertension, COPD, sleep apnea, chronic kidney disease and recent right kidney biopsy and stent placement by Dr. Tresa Moore last week presents to the ER at [facility] due to persistent weakness. Patient stated after procedure he was doing well and the following day he had a fall and has been having persistent pain in the right flank area. Patient also had been having ongoing hematuria which has gradually improved but still persists. Denies any nausea vomiting or diarrhea chest pain or shortness of breath. In the ER at [facility] patient was found to be hypotensive with a blood pressure 90 systolic hemoglobin was around 7.1 and patient had hematuria. Chest x-ray was showing congestion creatinine of 1.7 which was increased from baseline of normal last week. Patient was given 500 mL fluid bolus and 1 unit of PRBC and transferred to [facility]. On my exam patient denies any chest pain and is not in distress. Complains of right flank pain. Renal sonogram done at [facility] did not show anything acute.   Admitted with severe anemia, hypotension, UTI.   Assessment & Plan:   Principal Problem:   Acute blood loss anemia Active Problems:   CHF (congestive heart failure) (HCC)   Abdominal pain   Hypotension   OSA (obstructive sleep apnea)   Essential hypertension  1-Hypotension;   Suspect hypovolemia, anemia.  Improved. Lactic acid at 1.2. Blood transfusion. Would hold on IV fluids due to history of HF.   Covering for UTI, UA with too numerous to count WBC.   Acute blood loss anemia on chronic.  -Suspect related to hematuria, iron deficiency anemia -CT abdomen negative for retroperitoneal bleed.  -Anemia panel; consistent with iron deficiency  -Received 2  units PRBC 2-05. One unit at [facility].  -Per Dr Tresa Moore patient with similar presentation in the past that respond to blood transfusion and hb above 10./ -Will ordered IV iron today. Repeat hb in am.   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  Dr Tresa Moore following. Plan for sx at some point.   Hyponatremia; improved.   UTI; continue with aztreonam.  Follow urine culture.  Mild fever this morning.   AKI; improving.  Cr peak to 1.7. It has decreased to 1.3.  Holding cozaar.   Cirrhosis of liver by CT scan;  Will request records from his gastroenterology.  ammonia level normal, and viral hepatitis panel negative. INR; 5.17  Combined systolic and diastolic CHF  Resume oral lasix today, lower dose. Needs to reassess 2-07 lasix doses.  No ECHO available.requested  records.   COPD; nebulizer PRN.   Sleep apnea; CPAP    DVT prophylaxis: SCD  Code Status: full code.  Family Communication:  wife who was at bedside.  Disposition Plan: remain in the step down unit.   Consultants:   Dr Tresa Moore    Procedures: none  Antimicrobials:  Aztreonam 01-02-2017  Subjective: He is feeling better, less weak. Wife relates he slept most of the time yesterday/  Abdominal pain has improved.   Objective: Vitals:   01/03/17 0000 01/03/17 0310 01/03/17 0400 01/03/17 0600  BP: 135/65  116/74 (!) 115/59  Pulse: 95  (!)  101 (!) 101  Resp: '17  19 20  '$ Temp:  100 F (37.8 C)    TempSrc:  Axillary    SpO2: 97%  96% 94%  Weight:      Height:        Intake/Output Summary (Last 24 hours) at 01/03/17 0900 Last data filed at 01/03/17 0600  Gross per 24 hour  Intake             1870 ml  Output             2075 ml  Net             -205 ml   Filed Weights   01/01/17 2300 01/02/17 0600  Weight: (!) 144.3 kg (318 lb 2 oz) (!) 144.3 kg (318 lb 2 oz)     Examination:  General exam: Appears calm and comfortable , more alert  Respiratory system: Clear to auscultation. Respiratory effort normal. Cardiovascular system: S1 & S2 heard, RRR. No JVD, murmurs, rubs, gallops or clicks. No pedal edema. Gastrointestinal system: Abdomen is nondistended, soft and nontender. No organomegaly or masses felt. Normal bowel sounds heard. Central nervous system:alert and oriented. No focal neurological deficits. Extremities: Symmetric 5 x 5 power. Skin: No rashes, lesions or ulcers     Data Reviewed: I have personally reviewed following labs and imaging studies  CBC:  Recent Labs Lab 01/01/17 2248 01/02/17 0250 01/02/17 0636 01/03/17 0335  WBC 8.8 7.9 8.1 7.9  NEUTROABS 7.6 6.3 6.5  --   HGB 7.3* 7.1* 7.7* 9.1*  HCT 22.0* 21.5* 22.6* 26.8*  MCV 90.9 91.1 90.8 90.8  PLT 168 158 157 761   Basic Metabolic Panel:  Recent Labs Lab 01/01/17 2248 01/02/17 0636 01/03/17 0335  NA 124* 130* 133*  K 4.6 4.4 4.3  CL 93* 97* 99*  CO2 '25 26 27  '$ GLUCOSE 131* 121* 125*  BUN 37* 32* 29*  CREATININE 1.48* 1.33* 1.08  CALCIUM 8.0* 8.7* 8.7*   GFR: Estimated Creatinine Clearance: 82.9 mL/min (by C-G formula based on SCr of 1.08 mg/dL). Liver Function Tests:  Recent Labs Lab 01/01/17 2248  AST 35  ALT 18  ALKPHOS 118  BILITOT 1.4*  PROT 5.5*  ALBUMIN 2.6*   No results for input(s): LIPASE, AMYLASE in the last 168 hours.  Recent Labs Lab 01/02/17 1043  AMMONIA 26   Coagulation Profile:  Recent Labs Lab 01/02/17 1043  INR 1.35   Cardiac Enzymes:  Recent Labs Lab 01/01/17 2240 01/02/17 0636 01/02/17 1043  CKTOTAL 32*  --   --   TROPONINI 0.03* <0.03 0.03*   BNP (last 3 results) No results for input(s): PROBNP in the last 8760 hours. HbA1C: No results for input(s): HGBA1C in the last 72 hours. CBG:  Recent Labs Lab 01/03/17 0840  GLUCAP 127*   Lipid Profile: No results for input(s): CHOL, HDL, LDLCALC, TRIG,  CHOLHDL, LDLDIRECT in the last 72 hours. Thyroid Function Tests:  Recent Labs  01/02/17 0250  TSH 1.492   Anemia Panel:  Recent Labs  01/02/17 1043  VITAMINB12 1,483*  FOLATE 14.6  FERRITIN 59  TIBC 244*  IRON 17*  RETICCTPCT 2.2   Sepsis Labs:  Recent Labs Lab 01/01/17 2240  LATICACIDVEN 1.2    Recent Results (from the past 240 hour(s))  MRSA PCR Screening     Status: None   Collection Time: 01/01/17 11:07 PM  Result Value Ref Range Status   MRSA by PCR NEGATIVE NEGATIVE Final  Comment:        The GeneXpert MRSA Assay (FDA approved for NASAL specimens only), is one component of a comprehensive MRSA colonization surveillance program. It is not intended to diagnose MRSA infection nor to guide or monitor treatment for MRSA infections.          Radiology Studies: Ct Abdomen Pelvis Wo Contrast  Result Date: 01/02/2017 CLINICAL DATA:  Right flank pain, evaluate for hematoma, status post cystoscopy with double-J stent EXAM: CT ABDOMEN AND PELVIS WITHOUT CONTRAST TECHNIQUE: Multidetector CT imaging of the abdomen and pelvis was performed following the standard protocol without IV contrast. COMPARISON:  11/17/2016, ultrasound 01/01/2017, MRI 12/16/2014 FINDINGS: Lower chest: Trace right-sided pleural effusion. Patchy atelectasis within the right greater than left lung bases. Coronary artery calcification. Hepatobiliary: Nodular liver contour consistent with cirrhosis. Scattered subcentimeter hypodense lesions too small to characterize. Vague peripheral right hepatic lobe hypodensity, likely corresponds to MRI demonstrated probable hemangioma. No biliary dilatation. Multiple calcified gallstones Pancreas: No surrounding inflammation. Vague hypodense lesion mid pancreatic body may correspond to MRI suggested abnormality. Spleen: Enlarged at 18 cm. Adrenals/Urinary Tract: Adrenal glands within normal limits. Hypodense mass within the central right kidney measuring 4.4 cm,  grossly unchanged. Placement of ureteral stent on the right with proximal portion overlying the renal pelvis and distal portion visualized within the left aspect of the bladder. Bladder demonstrates a small focus of gas anteriorly. No wall thickening. No significant perinephric fluid collection. No hydronephrosis on the left. 11 mm exophytic density posterior cortex left kidney stable compared to MRI. Stomach/Bowel: Stomach nonenlarged. No dilated small or large bowel. No colon wall thickening. Vascular/Lymphatic: Tortuous vascular structures adjacent to the spleen consistent with varices. Atherosclerotic vascular calcification. Mild aneurysmal dilatation of the right common iliac artery, unchanged. No grossly enlarged lymph nodes. Reproductive: No masses or abnormalities. Other: No free air or free fluid. Mild hazy edema within the retroperitoneum. No evidence for retroperitoneal hematoma. Musculoskeletal: Stable compression deformities of T12 and L3. Multilevel vacuum disc. IMPRESSION: 1. No perinephric fluid collections or evidence for retroperitoneal hematoma 2. Stable 4.4 cm mass within the central right kidney. Placement of a right-sided ureteral stent as described above. 3. Cirrhosis. Portal hypertension with splenomegaly and left upper quadrant varices 4. Gallstones 5. Stable compression deformities of T12 and L3 Electronically Signed   By: Donavan Foil M.D.   On: 01/02/2017 01:05   Dg Chest Port 1 View  Result Date: 01/01/2017 CLINICAL DATA:  Right-sided flank pain EXAM: PORTABLE CHEST 1 VIEW COMPARISON:  01/01/2017 FINDINGS: Portable semi-erect view of the chest. Coarse interstitial opacities at the bilateral lung bases, suspect chronic change. Hazy right basilar atelectasis. No large effusion. Stable cardiomegaly and atherosclerosis. No pneumothorax visualized. IMPRESSION: 1. Stable cardiomegaly without overt failure 2. Hazy atelectasis at the right lung base. Electronically Signed   By: Donavan Foil  M.D.   On: 01/01/2017 23:50        Scheduled Meds: . allopurinol  100 mg Oral Daily  . aztreonam  1 g Intravenous Q8H  . ferrous sulfate  325 mg Oral BID WC  . ferumoxytol  510 mg Intravenous Once  . [START ON 01/04/2017] furosemide  40 mg Oral Daily  . mouth rinse  15 mL Mouth Rinse BID  . metoprolol succinate  50 mg Oral Daily  . pantoprazole  40 mg Oral Daily  . polyethylene glycol  17 g Oral Daily  . pravastatin  80 mg Oral Daily   Continuous Infusions: . sodium chloride 10 mL/hr at  01/03/17 0104     LOS: 2 days    Time spent: 35 minutes.     Elmarie Shiley, MD [facility] Pager [phone]  If 7PM-7AM, please contact night-coverage [email or web address] Password [email or web address] 01/03/2017, 9:00 AM

## 2017-01-03 NOTE — Progress Notes (Signed)
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 Most recently with   3-4 cm Rt renal pelvis enhancing mass worrisome for upper tract TCC on eva new gross hematuria. 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    2 - Chronic Renal Insufficiency -Follows Jamal Maes for h/o MPGN s/p Cytoxan 2007. Most recent Cr 1.4's or less.    3- Chronic Blood Loss Anemia - Hgb 7's 12/2016 with increased fatigue, somnolence. Has right renal pelvis cancer with chronic small volume bleeding as per above. He is quite sensitive to anemia having similar episode previously 2017 with symptoms resolved when Hgb >10.   Today "[name]" is improved some. Less fatigue. Hgb now 9s with transfusion. Fluid balance appears favorable. Fe studies show that may also be contributory to anemia.   Objective: Vital signs in last 24 hours: Temp:  [97.7 F (36.5 C)-100 F (37.8 C)] 100 F (37.8 C) (02/06 0310) Pulse Rate:  [82-101] 101 (02/06 0600) Resp:  [13-24] 20 (02/06 0600) BP: (91-154)/(35-74) 115/59 (02/06 0600) SpO2:  [93 %-97 %] 94 % (02/06 0600) Last BM Date: 01/01/17  Intake/Output from previous day: 02/05 0701 - 02/06 0700 In: 1890 [P.O.:840; I.V.:230; Blood:670; IV Piggyback:150] Out: 2525 [Urine:2525] Intake/Output this shift: No intake/output data recorded.  General appearance: alert, cooperative and wife at besdie Eyes: negative Nose: not examind as CPAP In place Throat: not examind as CPAP In place Neck: no adenopathy, no carotid bruit, no JVD, supple, symmetrical, trachea midline and thyroid not enlarged, symmetric, no tenderness/mass/nodules Back: symmetric, no curvature. ROM normal. No CVA tenderness. Resp: non-labored on CPAP.  Cardio: mild regular tachycardia by bedside monitor.  GI: stable morbid truncal obesity.  Male genitalia:  normal, clear urine at present.  Extremities: venous stasis dermatitis noted and stable mild LE edema, does not appear worse.  Neurologic: Grossly normal  Lab Results:   Recent Labs  01/02/17 0636 01/03/17 0335  WBC 8.1 7.9  HGB 7.7* 9.1*  HCT 22.6* 26.8*  PLT 157 157   BMET  Recent Labs  01/02/17 0636 01/03/17 0335  NA 130* 133*  K 4.4 4.3  CL 97* 99*  CO2 26 27  GLUCOSE 121* 125*  BUN 32* 29*  CREATININE 1.33* 1.08  CALCIUM 8.7* 8.7*   PT/INR  Recent Labs  01/02/17 1043  LABPROT 16.7*  INR 1.35   ABG No results for input(s): PHART, HCO3 in the last 72 hours.  Invalid input(s): PCO2, PO2  Studies/Results: Ct Abdomen Pelvis Wo Contrast  Result Date: 01/02/2017 CLINICAL DATA:  Right flank pain, evaluate for hematoma, status post cystoscopy with double-J stent EXAM: CT ABDOMEN AND PELVIS WITHOUT CONTRAST TECHNIQUE: Multidetector CT imaging of the abdomen and pelvis was performed following the standard protocol without IV contrast. COMPARISON:  11/17/2016, ultrasound 01/01/2017, MRI 12/16/2014 FINDINGS: Lower chest: Trace right-sided pleural effusion. Patchy atelectasis within the right greater than left lung bases. Coronary artery calcification. Hepatobiliary: Nodular liver contour consistent with cirrhosis. Scattered subcentimeter hypodense lesions too small to characterize. Vague peripheral right hepatic lobe hypodensity, likely corresponds to MRI demonstrated probable hemangioma. No biliary dilatation. Multiple calcified gallstones Pancreas: No surrounding inflammation. Vague hypodense lesion mid pancreatic body may correspond to MRI suggested abnormality. Spleen: Enlarged at 18 cm. Adrenals/Urinary Tract: Adrenal glands within normal limits. Hypodense mass within the central right kidney measuring 4.4 cm, grossly unchanged. Placement of ureteral stent  on the right with proximal portion overlying the renal pelvis and distal portion visualized within the left aspect of  the bladder. Bladder demonstrates a small focus of gas anteriorly. No wall thickening. No significant perinephric fluid collection. No hydronephrosis on the left. 11 mm exophytic density posterior cortex left kidney stable compared to MRI. Stomach/Bowel: Stomach nonenlarged. No dilated small or large bowel. No colon wall thickening. Vascular/Lymphatic: Tortuous vascular structures adjacent to the spleen consistent with varices. Atherosclerotic vascular calcification. Mild aneurysmal dilatation of the right common iliac artery, unchanged. No grossly enlarged lymph nodes. Reproductive: No masses or abnormalities. Other: No free air or free fluid. Mild hazy edema within the retroperitoneum. No evidence for retroperitoneal hematoma. Musculoskeletal: Stable compression deformities of T12 and L3. Multilevel vacuum disc. IMPRESSION: 1. No perinephric fluid collections or evidence for retroperitoneal hematoma 2. Stable 4.4 cm mass within the central right kidney. Placement of a right-sided ureteral stent as described above. 3. Cirrhosis. Portal hypertension with splenomegaly and left upper quadrant varices 4. Gallstones 5. Stable compression deformities of T12 and L3 Electronically Signed   By: Donavan Foil M.D.   On: 01/02/2017 01:05   Dg Chest Port 1 View  Result Date: 01/01/2017 CLINICAL DATA:  Right-sided flank pain EXAM: PORTABLE CHEST 1 VIEW COMPARISON:  01/01/2017 FINDINGS: Portable semi-erect view of the chest. Coarse interstitial opacities at the bilateral lung bases, suspect chronic change. Hazy right basilar atelectasis. No large effusion. Stable cardiomegaly and atherosclerosis. No pneumothorax visualized. IMPRESSION: 1. Stable cardiomegaly without overt failure 2. Hazy atelectasis at the right lung base. Electronically Signed   By: Donavan Foil M.D.   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 He has good understanding hs is much higher than average operative risk.   2 - Chronic Renal Insufficiency -GFR stable.   3- Chronic Blood Loss Anemia - Appreciate Dr. Paulina Fusi comanagement. I see no contra-indications to additional gentle transfusion and / or Fe given CV disease and likely continued low level on going losses.   Please call me directly with questions anytime.   [facility], Diesel Lina 01/03/2017

## 2017-01-04 DIAGNOSIS — I10 Essential (primary) hypertension: Secondary | ICD-10-CM

## 2017-01-04 DIAGNOSIS — I5042 Chronic combined systolic (congestive) and diastolic (congestive) heart failure: Secondary | ICD-10-CM

## 2017-01-04 DIAGNOSIS — D62 Acute posthemorrhagic anemia: Secondary | ICD-10-CM

## 2017-01-04 DIAGNOSIS — G4733 Obstructive sleep apnea (adult) (pediatric): Secondary | ICD-10-CM

## 2017-01-04 DIAGNOSIS — I959 Hypotension, unspecified: Secondary | ICD-10-CM

## 2017-01-04 LAB — CBC
HCT: 26.8 % — ABNORMAL LOW (ref 39.0–52.0)
Hemoglobin: 9 g/dL — ABNORMAL LOW (ref 13.0–17.0)
MCH: 30.5 pg (ref 26.0–34.0)
MCHC: 33.6 g/dL (ref 30.0–36.0)
MCV: 90.8 fL (ref 78.0–100.0)
Platelets: 185 10*3/uL (ref 150–400)
RBC: 2.95 MIL/uL — ABNORMAL LOW (ref 4.22–5.81)
RDW: 18.5 % — ABNORMAL HIGH (ref 11.5–15.5)
WBC: 6.6 10*3/uL (ref 4.0–10.5)

## 2017-01-04 MED ORDER — POLYETHYLENE GLYCOL 3350 17 G PO PACK: 17.0000 g | PACK | Freq: Every day | ORAL | 0 refills | Status: AC

## 2017-01-04 MED ORDER — FERROUS SULFATE 325 (65 FE) MG PO TABS: 325.0000 mg | ORAL_TABLET | Freq: Three times a day (TID) | ORAL | 0 refills | Status: DC

## 2017-01-04 MED ORDER — FUROSEMIDE 40 MG PO TABS
40.0000 mg | ORAL_TABLET | Freq: Two times a day (BID) | ORAL | Status: DC
  Administered 2017-01-04: 40 mg via ORAL
  Filled 2017-01-04: qty 1

## 2017-01-04 NOTE — Discharge Summary (Signed)
Physician Discharge Summary  Victor Wang XFG:[id number] DOB: 1935-06-23 DOA: 01/01/2017  PCP: Townsend Roger, MD  Admit date: 01/01/2017 Discharge date: 01/04/2017  Admitted From: Home  Disposition:  Home  Recommendations for Outpatient Follow-up:  1. Follow up with urology.  Patient will be called by clinic to schedule outpatient follow-up and surgery 2. Please obtain BMP/CBC in one week 3. Referral to gastroenterology for possible cirrhosis of the liver demonstrated on CT scan of the abdomen and pelvis. 4. PCP to consider resuming cozaar in 1 week if creatinine and blood pressure are stable  Home Health:  None  Equipment/Devices:  None  Discharge Condition:  Stable, improved CODE STATUS:  Full code  Diet recommendation:  Healthy heart, low sodium   Brief/Interim Summary:  The patient is a 82 year old male with history of combined systolic and diastolic heart failure, hypertension, COPD, obstructive sleep apnea, chronic kidney disease, bladder cancer.  He underwent a right kidney biopsy one week prior to admission for a kidney mass. Since his biopsy he had persistent hematuria. He presented to the [facility] emergency room where he was found to be hypotensive with a hemoglobin of 7.1 and ongoing hematuria.  He was transferred to [facility] for urology consultation.  He has been transfused a total of 3 units of pack red blood cells and given a dose of Feraheme for iron deficiency. His hemoglobin has stabilized at 9 g/dL and his hematuria has gradually improved. He was seen by urology and they will arrange for an outpatient laparoscopic nephrectomy.  Discharge Diagnoses:  Principal Problem:   Acute blood loss anemia Active Problems:   CHF (congestive heart failure) (HCC)   Abdominal pain   Hypotension   OSA (obstructive sleep apnea)   Essential hypertension  Acute blood loss anemia superimposed on chronic iron deficiency anemia with hypotension.  Blood pressure  improved with IV fluids and blood transfusion.  He received a total of 3 units of pack red blood cells. His bleeding was likely urologic. CT scan of the abdomen and pelvis was negative for retroperitoneal bleed and had no obvious blood in his stools. Folate and vitamin B12 levels were normal. His hematuria gradually improved without intervention. His hemoglobin has stabilized near 9 g/dL. He will have oral ferrous sulfate with stool softeners and will need a repeat CBC in approximately 1-2 weeks, or sooner if he notices worsening hematuria.  Right renal pelvis cancer, high-grade bladder cancer.  Will follow up with urology for surgery.    Hyponatremia due to dehydration, resolved with IV fluids.  Suspected UTI and was given empiric aztreonam however his culture was negative. Antibiotics were discontinued.   Mild AKI, creatinine trended down from 1.7 to 1.08, which his baseline, with IV fluids.  His Cozaar was held.  Cirrhosis of the liver by CT scan. Ammonia level is normal. Viral hepatitis panel was negative. Normal platelet count.  INR 1.35.  Consider referral to gastroenterology as an outpatient.  COPD, stable.  Sleep apnea, stable, continued CPAP  Combined systolic and diastolic congestive heart failure, ejection fraction unknown. His Lasix and cozaar were initially held due to hypotension but lasix has since been resumed.  He has 1+ pitting edema at the time of discharge and is encouraged to see his primary care doctor within 5 days for reassessment.  I continued to hold his cozaar due to low normal blood pressures and recent AKI.  Repeat BMP in 1 week and consider resumption of cozaar if blood pressure and creatinine  tolerate.  Discharge Instructions  Discharge Instructions    Call MD for:  difficulty breathing, headache or visual disturbances    Complete by:  As directed    Call MD for:  extreme fatigue    Complete by:  As directed    Call MD for:  hives    Complete by:  As directed     Call MD for:  persistant dizziness or light-headedness    Complete by:  As directed    Call MD for:  persistant nausea and vomiting    Complete by:  As directed    Call MD for:  severe uncontrolled pain    Complete by:  As directed    Call MD for:  temperature >100.4    Complete by:  As directed    Diet - low sodium heart healthy    Complete by:  As directed    Discharge instructions    Complete by:  As directed    If your hematuria recurs, please call the urology office right away.  If you feel lightheaded, dizzy, or faint and have bleeding, please call 911.   Increase activity slowly    Complete by:  As directed        Medication List    STOP taking these medications   losartan 100 MG tablet Commonly known as:  COZAAR   traMADol 50 MG tablet Commonly known as:  ULTRAM     TAKE these medications   acetaminophen 500 MG tablet Commonly known as:  TYLENOL Take 1,000 mg by mouth every 6 (six) hours as needed for moderate pain.   allopurinol 100 MG tablet Commonly known as:  ZYLOPRIM Take 100 mg by mouth daily.   CALCIUM 1200+D3 PO Take 1 tablet by mouth daily.   CoQ10 100 MG Caps Take 100 mg by mouth daily.   ferrous sulfate 325 (65 FE) MG tablet Take 1 tablet (325 mg total) by mouth 3 (three) times daily with meals. What changed:  when to take this   furosemide 40 MG tablet Commonly known as:  LASIX Take 40 mg by mouth 2 (two) times daily.   ipratropium-albuterol 0.5-2.5 (3) MG/3ML Soln Commonly known as:  DUONEB Take 3 mLs by nebulization every 6 (six) hours as needed (for wheezing/shortness of breath).   metoprolol succinate 50 MG 24 hr tablet Commonly known as:  TOPROL-XL Take 50 mg by mouth daily. Take with or immediately following a meal.   omeprazole 20 MG capsule Commonly known as:  PRILOSEC Take 20 mg by mouth daily.   polyethylene glycol packet Commonly known as:  MIRALAX / GLYCOLAX Take 17 g by mouth daily. Start taking on:  01/05/2017    potassium chloride SA 20 MEQ tablet Commonly known as:  K-DUR,KLOR-CON Take 20 mEq by mouth daily.   pravastatin 80 MG tablet Commonly known as:  PRAVACHOL Take 80 mg by mouth daily.      Follow-up Information    VAN EYK, Corene Cornea, MD. Schedule an appointment as soon as possible for a visit in 5 day(s).   Specialty:  Internal Medicine Why:  bloodwork, heart failure medication evaluation Contact information: [address] [place] [address] [phone]        Alexis Frock, MD. Schedule an appointment as soon as possible for a visit in 2 week(s).   Specialty:  Urology Contact information: [address] [place] [address] [phone]          Allergies  Allergen Reactions  .  Bee Venom Anaphylaxis  . Penicillins Anaphylaxis, Hives and Other (See Comments)    Has patient had a PCN reaction causing immediate rash, facial/tongue/throat swelling, SOB or lightheadedness with hypotension: Yes Has patient had a PCN reaction causing severe rash involving mucus membranes or skin necrosis: Yes Has patient had a PCN reaction that required hospitalization No Has patient had a PCN reaction occurring within the last 10 years: No If all of the above answers are "NO", then may proceed with Cephalosporin use.   . Tramadol Other (See Comments)    Dizzy and loopy.     Consultations: Urology, Dr. Tresa Moore   Procedures/Studies: Ct Abdomen Pelvis Wo Contrast  Result Date: 01/02/2017 CLINICAL DATA:  Right flank pain, evaluate for hematoma, status post cystoscopy with double-J stent EXAM: CT ABDOMEN AND PELVIS WITHOUT CONTRAST TECHNIQUE: Multidetector CT imaging of the abdomen and pelvis was performed following the standard protocol without IV contrast. COMPARISON:  11/17/2016, ultrasound 01/01/2017, MRI 12/16/2014 FINDINGS: Lower chest: Trace right-sided pleural effusion. Patchy atelectasis within the right greater than left lung bases. Coronary artery calcification. Hepatobiliary:  Nodular liver contour consistent with cirrhosis. Scattered subcentimeter hypodense lesions too small to characterize. Vague peripheral right hepatic lobe hypodensity, likely corresponds to MRI demonstrated probable hemangioma. No biliary dilatation. Multiple calcified gallstones Pancreas: No surrounding inflammation. Vague hypodense lesion mid pancreatic body may correspond to MRI suggested abnormality. Spleen: Enlarged at 18 cm. Adrenals/Urinary Tract: Adrenal glands within normal limits. Hypodense mass within the central right kidney measuring 4.4 cm, grossly unchanged. Placement of ureteral stent on the right with proximal portion overlying the renal pelvis and distal portion visualized within the left aspect of the bladder. Bladder demonstrates a small focus of gas anteriorly. No wall thickening. No significant perinephric fluid collection. No hydronephrosis on the left. 11 mm exophytic density posterior cortex left kidney stable compared to MRI. Stomach/Bowel: Stomach nonenlarged. No dilated small or large bowel. No colon wall thickening. Vascular/Lymphatic: Tortuous vascular structures adjacent to the spleen consistent with varices. Atherosclerotic vascular calcification. Mild aneurysmal dilatation of the right common iliac artery, unchanged. No grossly enlarged lymph nodes. Reproductive: No masses or abnormalities. Other: No free air or free fluid. Mild hazy edema within the retroperitoneum. No evidence for retroperitoneal hematoma. Musculoskeletal: Stable compression deformities of T12 and L3. Multilevel vacuum disc. IMPRESSION: 1. No perinephric fluid collections or evidence for retroperitoneal hematoma 2. Stable 4.4 cm mass within the central right kidney. Placement of a right-sided ureteral stent as described above. 3. Cirrhosis. Portal hypertension with splenomegaly and left upper quadrant varices 4. Gallstones 5. Stable compression deformities of T12 and L3 Electronically Signed   By: Donavan Foil M.D.    On: 01/02/2017 01:05   Dg Chest Port 1 View  Result Date: 01/01/2017 CLINICAL DATA:  Right-sided flank pain EXAM: PORTABLE CHEST 1 VIEW COMPARISON:  01/01/2017 FINDINGS: Portable semi-erect view of the chest. Coarse interstitial opacities at the bilateral lung bases, suspect chronic change. Hazy right basilar atelectasis. No large effusion. Stable cardiomegaly and atherosclerosis. No pneumothorax visualized. IMPRESSION: 1. Stable cardiomegaly without overt failure 2. Hazy atelectasis at the right lung base. Electronically Signed   By: Donavan Foil M.D.   On: 01/01/2017 23:50    Subjective: Asking to be discharged home.  States his hematuria has mostly resolved. He denies lightheadedness, dyspnea, chest pains.   Discharge Exam: Vitals:   01/04/17 1000 01/04/17 1200  BP: (!) 98/42   Pulse: 80   Resp: 19   Temp:  97.8 F (36.6 C)  Vitals:   01/04/17 0800 01/04/17 0915 01/04/17 1000 01/04/17 1200  BP: 117/73 117/73 (!) 98/42   Pulse: 86 85 80   Resp: 18  19   Temp: 98.1 F (36.7 C)   97.8 F (36.6 C)  TempSrc: Oral   Oral  SpO2: 95%  97%   Weight:      Height:        General: Adult male, no acute distress Cardiovascular: RRR, 2/6 systolic murmur at the left upper sternal border,  no rubs, no gallops Respiratory: CTA bilaterally, no wheezing, no rhonchi Abdominal: Soft, NT, ND, bowel sounds +.  Sore over the right lateral ribs but no overlying ecchymoses Extremities:  1+ pitting bilateral lower extremityma, no cyanosis    The results of significant diagnostics from this hospitalization (including imaging, microbiology, ancillary and laboratory) are listed below for reference.     Microbiology: Recent Results (from the past 240 hour(s))  MRSA PCR Screening     Status: None   Collection Time: 01/01/17 11:07 PM  Result Value Ref Range Status   MRSA by PCR NEGATIVE NEGATIVE Final    Comment:        The GeneXpert MRSA Assay (FDA approved for NASAL specimens only), is one  component of a comprehensive MRSA colonization surveillance program. It is not intended to diagnose MRSA infection nor to guide or monitor treatment for MRSA infections.   Culture, Urine     Status: None   Collection Time: 01/02/17  9:02 AM  Result Value Ref Range Status   Specimen Description URINE, CLEAN CATCH  Final   Special Requests NONE  Final   Culture   Final    NO GROWTH Performed at [facility], [address], [place], [place] [address]    Report Status 01/03/2017 FINAL  Final     Labs: BNP (last 3 results)  Recent Labs  11/15/16 1735 01/02/17 1043  BNP 639.1* 675.9*   Basic Metabolic Panel:  Recent Labs Lab 01/01/17 2248 01/02/17 0636 01/03/17 0335  NA 124* 130* 133*  K 4.6 4.4 4.3  CL 93* 97* 99*  CO2 '25 26 27  '$ GLUCOSE 131* 121* 125*  BUN 37* 32* 29*  CREATININE 1.48* 1.33* 1.08  CALCIUM 8.0* 8.7* 8.7*   Liver Function Tests:  Recent Labs Lab 01/01/17 2248  AST 35  ALT 18  ALKPHOS 118  BILITOT 1.4*  PROT 5.5*  ALBUMIN 2.6*   No results for input(s): LIPASE, AMYLASE in the last 168 hours.  Recent Labs Lab 01/02/17 1043  AMMONIA 26   CBC:  Recent Labs Lab 01/01/17 2248 01/02/17 0250 01/02/17 0636 01/03/17 0335 01/04/17 0825  WBC 8.8 7.9 8.1 7.9 6.6  NEUTROABS 7.6 6.3 6.5  --   --   HGB 7.3* 7.1* 7.7* 9.1* 9.0*  HCT 22.0* 21.5* 22.6* 26.8* 26.8*  MCV 90.9 91.1 90.8 90.8 90.8  PLT 168 158 157 157 185   Cardiac Enzymes:  Recent Labs Lab 01/01/17 2240 01/02/17 0636 01/02/17 1043  CKTOTAL 32*  --   --   TROPONINI 0.03* <0.03 0.03*   BNP: Invalid input(s): POCBNP CBG:  Recent Labs Lab 01/03/17 0840  GLUCAP 127*   D-Dimer No results for input(s): DDIMER in the last 72 hours. Hgb A1c No results for input(s): HGBA1C in the last 72 hours. Lipid Profile No results for input(s): CHOL, HDL, LDLCALC, TRIG, CHOLHDL, LDLDIRECT in the last 72 hours. Thyroid function studies  Recent Labs  01/02/17 0250  TSH  1.492   Anemia work up  Recent Labs  01/02/17 1043  VITAMINB12 1,483*  FOLATE 14.6  FERRITIN 59  TIBC 244*  IRON 17*  RETICCTPCT 2.2   Urinalysis    Component Value Date/Time   COLORURINE YELLOW 01/01/2017 2307   APPEARANCEUR CLOUDY (A) 01/01/2017 2307   LABSPEC 1.011 01/01/2017 2307   PHURINE 5.0 01/01/2017 2307   GLUCOSEU NEGATIVE 01/01/2017 2307   HGBUR LARGE (A) 01/01/2017 2307   [place] NEGATIVE 01/01/2017 2307   [place] 01/01/2017 2307   PROTEINUR 30 (A) 01/01/2017 2307   NITRITE NEGATIVE 01/01/2017 2307   LEUKOCYTESUR LARGE (A) 01/01/2017 2307   Sepsis Labs Invalid input(s): PROCALCITONIN,  WBC,  LACTICIDVEN   Time coordinating discharge: Over 30 minutes  SIGNED:   Janece Canterbury, MD  [facility] 01/04/2017, 12:50 PM Pager   If 7PM-7AM, please contact night-coverage [email or web address] Password [email or web address]

## 2017-01-04 NOTE — Evaluation (Signed)
Physical Therapy One Time Evaluation Patient Details Name: Victor Wang MRN: [id number] DOB: [birth year] Today's Date: 01/04/2017   History of Present Illness  82 year old male with history of combined systolic and diastolic heart failure, hypertension, COPD, obstructive sleep apnea, chronic kidney disease, bladder cancer.  He underwent a right kidney biopsy one week prior to admission for a kidney mass. Since his biopsy he had persistent hematuria. Urology consulted and will arrange for an outpatient laparoscopic nephrectomy  Clinical Impression  Patient evaluated by Physical Therapy with no further acute PT needs identified. All education has been completed and the patient has no further questions.  Pt tolerated ambulating in hallway well and appears at his baseline.  Pt reports his spouse will be home with him, and he has 5 children that are always checking on him. No further f/u Physical Therapy or equipment needs identified at this time. PT is signing off. Thank you for this referral.     Follow Up Recommendations No PT follow up    Equipment Recommendations  None recommended by PT    Recommendations for Other Services       Precautions / Restrictions Precautions Precautions: Fall Precaution Comments: chronic 3L O2      Mobility  Bed Mobility               General bed mobility comments: pt up in recliner on arrival  Transfers Overall transfer level: Needs assistance Equipment used: Rolling walker (2 wheeled) Transfers: Sit to/from Stand Sit to Stand: Supervision         General transfer comment: performs safely, monitored lines  Ambulation/Gait Ambulation/Gait assistance: Supervision Ambulation Distance (Feet): 160 Feet Assistive device: Rolling walker (2 wheeled) Gait Pattern/deviations: Step-through pattern     General Gait Details: slow but steady pace, pt feels at baseline, remained on 3L O2 [place], pt denies any symptoms  Stairs             Wheelchair Mobility    Modified Rankin (Stroke Patients Only)       Balance                                             Pertinent Vitals/Pain Pain Assessment: No/denies pain    Home Living Family/patient expects to be discharged to:: Private residence Living Arrangements: Spouse/significant other   Type of Home: House Home Access: Stairs to enter   Technical brewer of Steps: 2 and 1 Home Layout: One level Home Equipment: Walker - 2 wheels Additional Comments: pt reports no difficulty with a couple steps to enter home    Prior Function Level of Independence: Independent with assistive device(s)         Comments: typically uses RW for community, reports sometimes using RW at home but more frequently holds onto furniture/walls     Hand Dominance        Extremity/Trunk Assessment   Upper Extremity Assessment Upper Extremity Assessment: Overall WFL for tasks assessed    Lower Extremity Assessment Lower Extremity Assessment: Overall WFL for tasks assessed       Communication   Communication: No difficulties  Cognition Arousal/Alertness: Awake/alert Behavior During Therapy: WFL for tasks assessed/performed Overall Cognitive Status: Within Functional Limits for tasks assessed                      General Comments  Exercises     Assessment/Plan    PT Assessment Patent does not need any further PT services  PT Problem List            PT Treatment Interventions      PT Goals (Current goals can be found in the Care Plan section)  Acute Rehab PT Goals PT Goal Formulation: All assessment and education complete, DC therapy    Frequency     Barriers to discharge        Co-evaluation               End of Session Equipment Utilized During Treatment: Oxygen Activity Tolerance: Patient tolerated treatment well Patient left: in chair;with call bell/phone within reach;with chair alarm set Nurse  Communication: Mobility status         Time: 1510-1530 PT Time Calculation (min) (ACUTE ONLY): 20 min   Charges:   PT Evaluation $PT Eval Low Complexity: 1 Procedure     PT G Codes:        Merrit Friesen,KATHrine E 01/04/2017, 3:53 PM Carmelia Bake, PT, DPT 01/04/2017 Pager: [phone]

## 2017-01-05 ENCOUNTER — Other Ambulatory Visit: Payer: Self-pay | Admitting: Urology

## 2017-01-09 DIAGNOSIS — C641 Malignant neoplasm of right kidney, except renal pelvis: Secondary | ICD-10-CM | POA: Diagnosis not present

## 2017-01-09 DIAGNOSIS — D649 Anemia, unspecified: Secondary | ICD-10-CM | POA: Diagnosis not present

## 2017-01-09 DIAGNOSIS — I10 Essential (primary) hypertension: Secondary | ICD-10-CM | POA: Diagnosis not present

## 2017-01-10 DIAGNOSIS — R310 Gross hematuria: Secondary | ICD-10-CM | POA: Diagnosis not present

## 2017-01-10 DIAGNOSIS — C651 Malignant neoplasm of right renal pelvis: Secondary | ICD-10-CM | POA: Diagnosis not present

## 2017-01-10 DIAGNOSIS — C672 Malignant neoplasm of lateral wall of bladder: Secondary | ICD-10-CM | POA: Diagnosis not present

## 2017-01-12 DIAGNOSIS — E662 Morbid (severe) obesity with alveolar hypoventilation: Secondary | ICD-10-CM | POA: Diagnosis not present

## 2017-01-12 DIAGNOSIS — I130 Hypertensive heart and chronic kidney disease with heart failure and stage 1 through stage 4 chronic kidney disease, or unspecified chronic kidney disease: Secondary | ICD-10-CM | POA: Diagnosis not present

## 2017-01-12 DIAGNOSIS — C679 Malignant neoplasm of bladder, unspecified: Secondary | ICD-10-CM | POA: Diagnosis not present

## 2017-01-12 DIAGNOSIS — I872 Venous insufficiency (chronic) (peripheral): Secondary | ICD-10-CM | POA: Diagnosis not present

## 2017-01-12 DIAGNOSIS — I5033 Acute on chronic diastolic (congestive) heart failure: Secondary | ICD-10-CM | POA: Diagnosis not present

## 2017-01-12 DIAGNOSIS — G4733 Obstructive sleep apnea (adult) (pediatric): Secondary | ICD-10-CM | POA: Diagnosis not present

## 2017-01-15 DIAGNOSIS — R319 Hematuria, unspecified: Secondary | ICD-10-CM | POA: Diagnosis not present

## 2017-01-15 DIAGNOSIS — K7460 Unspecified cirrhosis of liver: Secondary | ICD-10-CM | POA: Diagnosis not present

## 2017-01-15 DIAGNOSIS — Z87891 Personal history of nicotine dependence: Secondary | ICD-10-CM | POA: Diagnosis not present

## 2017-01-15 DIAGNOSIS — I872 Venous insufficiency (chronic) (peripheral): Secondary | ICD-10-CM | POA: Diagnosis not present

## 2017-01-15 DIAGNOSIS — I130 Hypertensive heart and chronic kidney disease with heart failure and stage 1 through stage 4 chronic kidney disease, or unspecified chronic kidney disease: Secondary | ICD-10-CM | POA: Diagnosis not present

## 2017-01-15 DIAGNOSIS — Z9981 Dependence on supplemental oxygen: Secondary | ICD-10-CM | POA: Diagnosis not present

## 2017-01-15 DIAGNOSIS — I5033 Acute on chronic diastolic (congestive) heart failure: Secondary | ICD-10-CM | POA: Diagnosis not present

## 2017-01-15 DIAGNOSIS — E662 Morbid (severe) obesity with alveolar hypoventilation: Secondary | ICD-10-CM | POA: Diagnosis not present

## 2017-01-15 DIAGNOSIS — G4733 Obstructive sleep apnea (adult) (pediatric): Secondary | ICD-10-CM | POA: Diagnosis not present

## 2017-01-15 DIAGNOSIS — D509 Iron deficiency anemia, unspecified: Secondary | ICD-10-CM | POA: Diagnosis not present

## 2017-01-15 DIAGNOSIS — Z6841 Body Mass Index (BMI) 40.0 and over, adult: Secondary | ICD-10-CM | POA: Diagnosis not present

## 2017-01-15 DIAGNOSIS — J449 Chronic obstructive pulmonary disease, unspecified: Secondary | ICD-10-CM | POA: Diagnosis not present

## 2017-01-15 DIAGNOSIS — C679 Malignant neoplasm of bladder, unspecified: Secondary | ICD-10-CM | POA: Diagnosis not present

## 2017-01-15 DIAGNOSIS — I959 Hypotension, unspecified: Secondary | ICD-10-CM | POA: Diagnosis not present

## 2017-01-16 DIAGNOSIS — E785 Hyperlipidemia, unspecified: Secondary | ICD-10-CM | POA: Diagnosis not present

## 2017-01-16 DIAGNOSIS — G4733 Obstructive sleep apnea (adult) (pediatric): Secondary | ICD-10-CM | POA: Diagnosis not present

## 2017-01-16 DIAGNOSIS — I5032 Chronic diastolic (congestive) heart failure: Secondary | ICD-10-CM | POA: Diagnosis not present

## 2017-01-16 DIAGNOSIS — I10 Essential (primary) hypertension: Secondary | ICD-10-CM | POA: Diagnosis not present

## 2017-01-17 DIAGNOSIS — I872 Venous insufficiency (chronic) (peripheral): Secondary | ICD-10-CM | POA: Diagnosis not present

## 2017-01-17 DIAGNOSIS — I10 Essential (primary) hypertension: Secondary | ICD-10-CM | POA: Diagnosis not present

## 2017-01-17 DIAGNOSIS — D509 Iron deficiency anemia, unspecified: Secondary | ICD-10-CM | POA: Diagnosis not present

## 2017-01-17 DIAGNOSIS — J449 Chronic obstructive pulmonary disease, unspecified: Secondary | ICD-10-CM | POA: Diagnosis not present

## 2017-01-17 DIAGNOSIS — I130 Hypertensive heart and chronic kidney disease with heart failure and stage 1 through stage 4 chronic kidney disease, or unspecified chronic kidney disease: Secondary | ICD-10-CM | POA: Diagnosis not present

## 2017-01-17 DIAGNOSIS — I5033 Acute on chronic diastolic (congestive) heart failure: Secondary | ICD-10-CM | POA: Diagnosis not present

## 2017-01-17 DIAGNOSIS — G4733 Obstructive sleep apnea (adult) (pediatric): Secondary | ICD-10-CM | POA: Diagnosis not present

## 2017-01-17 DIAGNOSIS — I5032 Chronic diastolic (congestive) heart failure: Secondary | ICD-10-CM | POA: Diagnosis not present

## 2017-01-17 DIAGNOSIS — E785 Hyperlipidemia, unspecified: Secondary | ICD-10-CM | POA: Diagnosis not present

## 2017-01-17 DIAGNOSIS — C679 Malignant neoplasm of bladder, unspecified: Secondary | ICD-10-CM | POA: Diagnosis not present

## 2017-01-17 DIAGNOSIS — D649 Anemia, unspecified: Secondary | ICD-10-CM | POA: Diagnosis not present

## 2017-01-19 NOTE — Patient Instructions (Addendum)
Victor Wang  01/19/2017   Your procedure is scheduled on: 02-01-17  Report to [facility]  Entrance take [facility]  elevators to 3rd floor to  [place] at 630AM.  Call this number if you have problems the morning of surgery [phone]   Remember: ONLY 1 PERSON MAY GO WITH YOU TO SHORT STAY TO GET  READY MORNING OF [place].  Do not eat food After Midnight on Monday 01-30-17. Clear liquids all day Tuesday 01-31-17. Follow all of Dr Lucy Antigua instructions for prep. Nothing by mouth after midnight on Tuesday 01-31-17!     Take these medicines the morning of surgery with A SIP OF WATER: metoprolol(lopressor), allopurinol(zyloprim), omeprazole(prilosec), pravastatin(pravachol), inhalers and nebulizers as needed, tylenol as needed.                                 You may not have any metal on your body including hair pins and              piercings  Do not wear jewelry, make-up, lotions, powders or perfumes, deodorant             Do not wear nail polish.  Do not shave  48 hours prior to surgery.              Men may shave face and neck.   Do not bring valuables to the hospital. [place].  Contacts, dentures or bridgework may not be worn into surgery.  Leave suitcase in the car. After surgery it may be brought to your room.              Please read over the following fact sheets you were given: _____________________________________________________________________                CLEAR LIQUID DIET   Foods Allowed                                                                     Foods Excluded  Coffee and tea, regular and decaf                             liquids that you cannot  Plain Jell-O in any flavor                                             see through such as: Fruit ices (not with fruit pulp)                                     milk, soups, orange juice  Iced Popsicles  All solid food Carbonated beverages, regular and diet                                    Cranberry, grape and apple juices Sports drinks like Gatorade Lightly seasoned clear broth or consume(fat free) Sugar, honey syrup  Sample Menu Breakfast                                Lunch                                     Supper Cranberry juice                    Beef broth                            Chicken broth Jell-O                                     Grape juice                           Apple juice Coffee or tea                        Jell-O                                      Popsicle                                                Coffee or tea                        Coffee or tea  _____________________________________________________________________  [facility] - Preparing for Surgery Before surgery, you can play an important role.  Because skin is not sterile, your skin needs to be as free of germs as possible.  You can reduce the number of germs on your skin by washing with CHG (chlorahexidine gluconate) soap before surgery.  CHG is an antiseptic cleaner which kills germs and bonds with the skin to continue killing germs even after washing. Please DO NOT use if you have an allergy to CHG or antibacterial soaps.  If your skin becomes reddened/irritated stop using the CHG and inform your nurse when you arrive at Short Stay. Do not shave (including legs and underarms) for at least 48 hours prior to the first CHG shower.  You may shave your face/neck. Please follow these instructions carefully:  1.  Shower with CHG Soap the night before surgery and the  morning of Surgery.  2.  If you choose to wash your hair, wash your hair first as usual with your  normal  shampoo.  3.  After you shampoo, rinse your hair and body thoroughly to remove the  shampoo.  4.  Use CHG as you would any other liquid soap.  You can apply chg directly  to the skin and wash                        Gently with a scrungie or clean washcloth.  5.  Apply the CHG Soap to your body ONLY FROM THE NECK DOWN.   Do not use on face/ open                           Wound or open sores. Avoid contact with eyes, ears mouth and genitals (private parts).                       Wash face,  Genitals (private parts) with your normal soap.             6.  Wash thoroughly, paying special attention to the area where your surgery  will be performed.  7.  Thoroughly rinse your body with warm water from the neck down.  8.  DO NOT shower/wash with your normal soap after using and rinsing off  the CHG Soap.                9.  Pat yourself dry with a clean towel.            10.  Wear clean pajamas.            11.  Place clean sheets on your bed the night of your first shower and do not  sleep with pets. Day of Surgery : Do not apply any lotions/deodorants the morning of surgery.  Please wear clean clothes to the hospital/surgery center.  FAILURE TO FOLLOW THESE INSTRUCTIONS MAY RESULT IN THE CANCELLATION OF YOUR SURGERY PATIENT SIGNATURE_________________________________  NURSE SIGNATURE__________________________________  ________________________________________________________________________  WHAT IS A BLOOD TRANSFUSION? Blood Transfusion Information  A transfusion is the replacement of blood or some of its parts. Blood is made up of multiple cells which provide different functions.  Red blood cells carry oxygen and are used for blood loss replacement.  White blood cells fight against infection.  Platelets control bleeding.  Plasma helps clot blood.  Other blood products are available for specialized needs, such as hemophilia or other clotting disorders. BEFORE THE TRANSFUSION  Who gives blood for transfusions?   Healthy volunteers who are fully evaluated to make sure their blood is safe. This is blood bank blood. Transfusion therapy is the safest it has ever been in the practice of  medicine. Before blood is taken from a donor, a complete history is taken to make sure that person has no history of diseases nor engages in risky social behavior (examples are intravenous drug use or sexual activity with multiple partners). The donor's travel history is screened to minimize risk of transmitting infections, such as malaria. The donated blood is tested for signs of infectious diseases, such as HIV and hepatitis. The blood is then tested to be sure it is compatible with you in order to minimize the chance of a transfusion reaction. If you or a relative donates blood, this is often done in anticipation of surgery and is not appropriate for emergency situations. It takes many days to process the donated blood. RISKS AND COMPLICATIONS Although transfusion therapy is very safe and saves many lives, the main dangers of transfusion include:   Getting an infectious disease.  Developing a transfusion reaction.  This is an allergic reaction to something in the blood you were given. Every precaution is taken to prevent this. The decision to have a blood transfusion has been considered carefully by your caregiver before blood is given. Blood is not given unless the benefits outweigh the risks. AFTER THE TRANSFUSION  Right after receiving a blood transfusion, you will usually feel much better and more energetic. This is especially true if your red blood cells have gotten low (anemic). The transfusion raises the level of the red blood cells which carry oxygen, and this usually causes an energy increase.  The nurse administering the transfusion will monitor you carefully for complications. HOME CARE INSTRUCTIONS  No special instructions are needed after a transfusion. You may find your energy is better. Speak with your caregiver about any limitations on activity for underlying diseases you may have. SEEK MEDICAL CARE IF:   Your condition is not improving after your transfusion.  You develop  redness or irritation at the intravenous (IV) site. SEEK IMMEDIATE MEDICAL CARE IF:  Any of the following symptoms occur over the next 12 hours:  Shaking chills.  You have a temperature by mouth above 102 F (38.9 C), not controlled by medicine.  Chest, back, or muscle pain.  People around you feel you are not acting correctly or are confused.  Shortness of breath or difficulty breathing.  Dizziness and fainting.  You get a rash or develop hives.  You have a decrease in urine output.  Your urine turns a dark color or changes to pink, red, or brown. Any of the following symptoms occur over the next 10 days:  You have a temperature by mouth above 102 F (38.9 C), not controlled by medicine.  Shortness of breath.  Weakness after normal activity.  The white part of the eye turns yellow (jaundice).  You have a decrease in the amount of urine or are urinating less often.  Your urine turns a dark color or changes to pink, red, or brown. Document Released: 11/11/2000 Document Revised: 02/06/2012 Document Reviewed: 06/30/2008 [facility] Patient Information 2014 [place], [place].  _______________________________________________________________________

## 2017-01-19 NOTE — Progress Notes (Addendum)
LOV/ cardio clearance Dr Agustin Cree 01-16-17 on chart CXR 01-01-17 epic EKG 11-17-16 epic BMP 01-20-17 epic

## 2017-01-20 ENCOUNTER — Encounter (HOSPITAL_COMMUNITY): Payer: Self-pay

## 2017-01-20 ENCOUNTER — Emergency Department (HOSPITAL_COMMUNITY): Payer: Medicare Other

## 2017-01-20 ENCOUNTER — Emergency Department (HOSPITAL_COMMUNITY)
Admission: EM | Admit: 2017-01-20 | Discharge: 2017-01-20 | Disposition: A | Discharge status: Home / Self Care | Payer: Medicare Other | Attending: Emergency Medicine | Admitting: Emergency Medicine

## 2017-01-20 DIAGNOSIS — D500 Iron deficiency anemia secondary to blood loss (chronic): Secondary | ICD-10-CM | POA: Diagnosis not present

## 2017-01-20 DIAGNOSIS — R109 Unspecified abdominal pain: Secondary | ICD-10-CM | POA: Diagnosis not present

## 2017-01-20 DIAGNOSIS — Z8551 Personal history of malignant neoplasm of bladder: Secondary | ICD-10-CM | POA: Insufficient documentation

## 2017-01-20 DIAGNOSIS — Z87891 Personal history of nicotine dependence: Secondary | ICD-10-CM | POA: Diagnosis not present

## 2017-01-20 DIAGNOSIS — C679 Malignant neoplasm of bladder, unspecified: Secondary | ICD-10-CM | POA: Diagnosis not present

## 2017-01-20 DIAGNOSIS — R310 Gross hematuria: Secondary | ICD-10-CM | POA: Insufficient documentation

## 2017-01-20 DIAGNOSIS — J449 Chronic obstructive pulmonary disease, unspecified: Secondary | ICD-10-CM | POA: Insufficient documentation

## 2017-01-20 DIAGNOSIS — I110 Hypertensive heart disease with heart failure: Secondary | ICD-10-CM | POA: Diagnosis not present

## 2017-01-20 DIAGNOSIS — I5033 Acute on chronic diastolic (congestive) heart failure: Secondary | ICD-10-CM | POA: Diagnosis not present

## 2017-01-20 DIAGNOSIS — I509 Heart failure, unspecified: Secondary | ICD-10-CM | POA: Insufficient documentation

## 2017-01-20 DIAGNOSIS — R339 Retention of urine, unspecified: Secondary | ICD-10-CM | POA: Diagnosis present

## 2017-01-20 DIAGNOSIS — I130 Hypertensive heart and chronic kidney disease with heart failure and stage 1 through stage 4 chronic kidney disease, or unspecified chronic kidney disease: Secondary | ICD-10-CM | POA: Diagnosis not present

## 2017-01-20 DIAGNOSIS — I872 Venous insufficiency (chronic) (peripheral): Secondary | ICD-10-CM | POA: Diagnosis not present

## 2017-01-20 DIAGNOSIS — D509 Iron deficiency anemia, unspecified: Secondary | ICD-10-CM | POA: Diagnosis not present

## 2017-01-20 LAB — CBC WITH DIFFERENTIAL/PLATELET
Basophils Absolute: 0 10*3/uL (ref 0.0–0.1)
Basophils Relative: 0 %
Eosinophils Absolute: 0 10*3/uL (ref 0.0–0.7)
Eosinophils Relative: 1 %
HCT: 22.8 % — ABNORMAL LOW (ref 39.0–52.0)
Hemoglobin: 7.5 g/dL — ABNORMAL LOW (ref 13.0–17.0)
LYMPHS ABS: 0.5 10*3/uL — AB (ref 0.7–4.0)
LYMPHS PCT: 8 %
MCH: 30.4 pg (ref 26.0–34.0)
MCHC: 32.9 g/dL (ref 30.0–36.0)
MCV: 92.3 fL (ref 78.0–100.0)
MONO ABS: 0.4 10*3/uL (ref 0.1–1.0)
MONOS PCT: 7 %
Neutro Abs: 4.9 10*3/uL (ref 1.7–7.7)
Neutrophils Relative %: 84 %
PLATELETS: 185 10*3/uL (ref 150–400)
RBC: 2.47 MIL/uL — AB (ref 4.22–5.81)
RDW: 17.9 % — ABNORMAL HIGH (ref 11.5–15.5)
WBC: 5.8 10*3/uL (ref 4.0–10.5)

## 2017-01-20 LAB — BASIC METABOLIC PANEL
Anion gap: 8 (ref 5–15)
BUN: 16 mg/dL (ref 6–20)
CO2: 24 mmol/L (ref 22–32)
CREATININE: 0.97 mg/dL (ref 0.61–1.24)
Calcium: 8.9 mg/dL (ref 8.9–10.3)
Chloride: 100 mmol/L — ABNORMAL LOW (ref 101–111)
GFR calc Af Amer: >60 mL/min (ref >60)
GFR calc non Af Amer: >60 mL/min (ref >60)
GLUCOSE: 147 mg/dL — AB (ref 65–99)
POTASSIUM: 3.8 mmol/L (ref 3.5–5.1)
Sodium: 132 mmol/L — ABNORMAL LOW (ref 135–145)

## 2017-01-20 LAB — URINALYSIS, ROUTINE W REFLEX MICROSCOPIC
BILIRUBIN URINE: NEGATIVE
Bacteria, UA: NONE SEEN
GLUCOSE, UA: NEGATIVE mg/dL
Ketones, ur: NEGATIVE mg/dL
NITRITE: NEGATIVE
PH: 6 (ref 5.0–8.0)
Protein, ur: 100 mg/dL — AB
SPECIFIC GRAVITY, URINE: 1.016 (ref 1.005–1.030)
SQUAMOUS EPITHELIAL / LPF: NONE SEEN

## 2017-01-20 MED ORDER — ONDANSETRON HCL 4 MG/2ML IJ SOLN
4.0000 mg | Freq: Once | INTRAMUSCULAR | Status: AC
  Administered 2017-01-20: 4 mg via INTRAVENOUS
  Filled 2017-01-20: qty 2

## 2017-01-20 MED ORDER — HYDROCODONE-ACETAMINOPHEN 5-325 MG PO TABS: 1.0000 | ORAL_TABLET | ORAL | 0 refills | Status: DC | PRN

## 2017-01-20 MED ORDER — FENTANYL CITRATE (PF) 100 MCG/2ML IJ SOLN
100.0000 ug | INTRAMUSCULAR | Status: AC | PRN
  Administered 2017-01-20 (×3): 100 ug via INTRAVENOUS
  Filled 2017-01-20 (×3): qty 2

## 2017-01-20 MED ORDER — LIDOCAINE HCL 2 % EX GEL
1.0000 "application " | Freq: Once | CUTANEOUS | Status: AC
  Administered 2017-01-20: 1 via URETHRAL
  Filled 2017-01-20: qty 11

## 2017-01-20 NOTE — ED Provider Notes (Signed)
[facility] DEPT Provider Note   CSN: [id number] Arrival date & time: 01/20/17  1816     History   Chief Complaint Chief Complaint  Patient presents with  . Urinary Retention    HPI Victor Wang is a 82 y.o. male.  He presents for evaluation of right flank and right lower abdominal pain which started today and is associated with a sensation of "blocked urine", and hematuria.  He states that since early this morning he has been only able to "dribble" when he urinates.  He denies fever chills nausea vomiting cough shortness of breath weakness or dizziness.  He is scheduled for right nephrectomy for cancer, next month.  There are no other known modifying factors.  HPI  Past Medical History:  Diagnosis Date  . Anemia   . Arthritis   . Bladder cancer (HCC)    RECURRENT  . BPH (benign prostatic hypertrophy)   . CHF (congestive heart failure) ([place])   . Clot 2007    kidney biopsy done  . Complication of anesthesia    oxygen level drops when put to sleep , has some bleeding issues after every surgery with cautery required  . COPD (chronic obstructive pulmonary disease) ([place])   . Dyspnea    exertion; can not complete a flight of stairs without stopping to catch his breath   . Full dentures   . GERD (gastroesophageal reflux disease)   . Headache   . History of blood transfusion last done 11-13-16   total of 7 units   . History of compression fracture of spine    04/2013   T12  . Hyperlipidemia   . Hypertension   . Nephritis 2007   oral chemo and prednisone done  . Pneumonia last 2016   x 2 in 2016  . PONV (postoperative nausea and vomiting)   . Pulmonary hypertension    mild per 12-15-15 echo [facility]  . Renal mass    right  . Sleep apnea     Patient Active Problem List   Diagnosis Date Noted  . CHF (congestive heart failure) ([place]) 01/01/2017  . Acute blood loss anemia 01/01/2017  . Abdominal pain 01/01/2017  . Hypotension 01/01/2017  . OSA  (obstructive sleep apnea) 01/01/2017  . Essential hypertension 01/01/2017  . Bladder cancer ([place]) 09/10/2015    Past Surgical History:  Procedure Laterality Date  . APPENDECTOMY  1985  . CARPAL TUNNEL RELEASE Right 02-05-2008  . CYSTOSCOPY W/ RETROGRADES Bilateral 10/29/2014   Procedure: CYSTOSCOPY WITH RETROGRADE PYELOGRAM;  Surgeon: Alexis Frock, MD;  Location: [facility];  Service: Urology;  Laterality: Bilateral;  . CYSTOSCOPY W/ RETROGRADES Bilateral 09/10/2015   Procedure: CYSTOSCOPY WITH RETROGRADE PYELOGRAM;  Surgeon: Alexis Frock, MD;  Location: [facility];  Service: Urology;  Laterality: Bilateral;  . CYSTOSCOPY WITH BIOPSY N/A 05/26/2014   Procedure: CYSTOSCOPY WITH BIOPSY;  Surgeon: Sharyn Creamer, MD;  Location: [facility];  Service: Urology;  Laterality: N/A;  . CYSTOSCOPY WITH RETROGRADE PYELOGRAM, URETEROSCOPY AND STENT PLACEMENT Bilateral 04/09/2014   Procedure: CYSTOSCOPY WITH RETROGRADE PYELOGRAM, POSSIBLE URETEROSCOPY WITH BIOPSY AND STENT PLACEMENT;  Surgeon: Sharyn Creamer, MD;  Location: [facility];  Service: Urology;  Laterality: Bilateral;  . CYSTOSCOPY/RETROGRADE/URETEROSCOPY Right 12/28/2016   Procedure: CYSTOSCOPY/RETROGRADE/URETEROSCOPY WITH BIOPSY right renal pelvis insertion double j stent;  Surgeon: Alexis Frock, MD;  Location: [facility];  Service: Urology;  Laterality: Right;  . KNEE LIGAMENT RECONSTRUCTION Right 1970  . RIGHT SHOULDER  SURGERY  2011  . TONSILLECTOMY AND ADENOIDECTOMY  CHILD   and adenoids  . TRANSURETHRAL RESECTION OF BLADDER TUMOR WITH GYRUS (TURBT-GYRUS) Bilateral 04/09/2014   Procedure: TRANSURETHRAL RESECTION OF BLADDER TUMOR WITH GYRUS (TURBT-GYRUS);  Surgeon: Sharyn Creamer, MD;  Location: [facility];  Service: Urology;  Laterality: Bilateral;  . TRANSURETHRAL RESECTION OF BLADDER TUMOR WITH GYRUS (TURBT-GYRUS) N/A 10/29/2014   Procedure:  TRANSURETHRAL RESECTION OF BLADDER TUMOR WITH GYRUS (TURBT-GYRUS);  Surgeon: Alexis Frock, MD;  Location: [facility];  Service: Urology;  Laterality: N/A;  . TRANSURETHRAL RESECTION OF BLADDER TUMOR WITH GYRUS (TURBT-GYRUS) N/A 09/10/2015   Procedure: TRANSURETHRAL RESECTION OF BLADDER TUMOR ;  Surgeon: Alexis Frock, MD;  Location: [facility];  Service: Urology;  Laterality: N/A;  . TRANSURETHRAL RESECTION OF PROSTATE  2006   AND    POST CAURTERIZATION OF BLEEDERS  . TRANSURETHRAL RESECTION OF PROSTATE N/A 09/10/2015   Procedure: TRANSURETHRAL RESECTION OF THE PROSTATE ;  Surgeon: Alexis Frock, MD;  Location: [facility];  Service: Urology;  Laterality: N/A;       Home Medications    Prior to Admission medications   Medication Sig Start Date End Date Taking? Authorizing Provider  acetaminophen (TYLENOL) 500 MG tablet Take 1,000 mg by mouth every 6 (six) hours as needed for moderate pain.   Yes Historical Provider, MD  albuterol (PROVENTIL HFA;VENTOLIN HFA) 108 (90 Base) MCG/ACT inhaler Inhale 1-2 puffs into the lungs every 6 (six) hours as needed for wheezing or shortness of breath.   Yes Historical Provider, MD  allopurinol (ZYLOPRIM) 100 MG tablet Take 100 mg by mouth daily.    Yes Historical Provider, MD  ferrous sulfate 325 (65 FE) MG tablet Take 1 tablet (325 mg total) by mouth 3 (three) times daily with meals. 01/04/17  Yes Janece Canterbury, MD  furosemide (LASIX) 40 MG tablet Take 40 mg by mouth 2 (two) times daily.   Yes Historical Provider, MD  ipratropium-albuterol (DUONEB) 0.5-2.5 (3) MG/3ML SOLN Take 3 mLs by nebulization every 6 (six) hours as needed (for wheezing/shortness of breath).   Yes Historical Provider, MD  metoprolol (LOPRESSOR) 50 MG tablet Take 50 mg by mouth daily.   Yes Historical Provider, MD  omeprazole (PRILOSEC) 20 MG capsule Take 20 mg by mouth daily.    Yes Historical Provider, MD  polyethylene glycol (MIRALAX  / GLYCOLAX) packet Take 17 g by mouth daily. 01/05/17  Yes Janece Canterbury, MD  potassium chloride SA (K-DUR,KLOR-CON) 20 MEQ tablet Take 20 mEq by mouth daily.   Yes Historical Provider, MD  pravastatin (PRAVACHOL) 80 MG tablet Take 80 mg by mouth daily.    Yes Historical Provider, MD  Calcium-Magnesium-Vitamin D (CALCIUM 1200+D3 PO) Take 1 tablet by mouth daily.    Historical Provider, MD  Coenzyme Q10 (COQ10) 100 MG CAPS Take 100 mg by mouth daily.    Historical Provider, MD  HYDROcodone-acetaminophen (NORCO) 5-325 MG tablet Take 1 tablet by mouth every 4 (four) hours as needed. 01/20/17   Daleen Bo, MD    Family History Family History  Problem Relation Age of Onset  . Congestive Heart Failure Mother   . Bladder Cancer Neg Hx     Social History Social History  Substance Use Topics  . Smoking status: Former Smoker    Packs/day: 1.50    Years: 35.00    Types: Cigarettes    Quit date: 11/28/1985  . Smokeless tobacco: Never Used  . Alcohol use 2.4  oz/week    4 Standard drinks or equivalent per week     Comment: social drink occas      Allergies   Bee venom; Penicillins; and Tramadol   Review of Systems Review of Systems  All other systems reviewed and are negative.    Physical Exam Updated Vital Signs BP 138/86 (BP Location: Left Arm)   Pulse 103   Temp 98.6 F (37 C) (Oral)   Resp 22   SpO2 99%   Physical Exam  Constitutional: He is oriented to person, place, and time. He appears well-developed.  Elderly, overweight  HENT:  Head: Normocephalic and atraumatic.  Right Ear: External ear normal.  Left Ear: External ear normal.  Eyes: Conjunctivae and EOM are normal. Pupils are equal, round, and reactive to light.  Neck: Normal range of motion and phonation normal. Neck supple.  Cardiovascular: Normal rate, regular rhythm and normal heart sounds.   Pulmonary/Chest: Effort normal and breath sounds normal. He exhibits no tenderness and no bony tenderness.    Abdominal: Soft. Bowel sounds are normal. He exhibits no distension. There is no tenderness.  Genitourinary:  Genitourinary Comments: No costovertebral angle tenderness, with percussion.  Musculoskeletal: Normal range of motion. He exhibits no edema.  Neurological: He is alert and oriented to person, place, and time. No cranial nerve deficit or sensory deficit. He exhibits normal muscle tone. Coordination normal.  Skin: Skin is warm, dry and intact.  Psychiatric: He has a normal mood and affect. His behavior is normal. Judgment and thought content normal.  Nursing note and vitals reviewed.    ED Treatments / Results  Labs (all labs ordered are listed, but only abnormal results are displayed) Labs Reviewed  URINALYSIS, ROUTINE W REFLEX MICROSCOPIC - Abnormal; Notable for the following:       Result Value   Color, Urine RED (*)    APPearance CLOUDY (*)    Hgb urine dipstick LARGE (*)    Protein, ur 100 (*)    Leukocytes, UA LARGE (*)    All other components within normal limits  BASIC METABOLIC PANEL - Abnormal; Notable for the following:    Sodium 132 (*)    Chloride 100 (*)    Glucose, Bld 147 (*)    All other components within normal limits  CBC WITH DIFFERENTIAL/PLATELET - Abnormal; Notable for the following:    RBC 2.47 (*)    Hemoglobin 7.5 (*)    HCT 22.8 (*)    RDW 17.9 (*)    Lymphs Abs 0.5 (*)    All other components within normal limits    EKG  EKG Interpretation None       Radiology Ct Renal Stone Study  Result Date: 01/20/2017 CLINICAL DATA:  82 year old male with right flank pain. EXAM: CT ABDOMEN AND PELVIS WITHOUT CONTRAST TECHNIQUE: Multidetector CT imaging of the abdomen and pelvis was performed following the standard protocol without IV contrast. COMPARISON:  Abdominal CT dated 01/20/2017 and abdominal MRI dated 12/16/2014 FINDINGS: Evaluation of this exam is limited in the absence of intravenous contrast. Lower chest: Bibasilar interstitial  coarsening. There is a 4 mm partially calcified subpleural nodule in the right middle lobe. The visualized lung bases are otherwise clear. There is multi vessel coronary vascular calcification. There is hypoattenuation of the cardiac blood pool suggestive of a degree of anemia. Clinical correlation is recommended. No intra-abdominal free air or free fluid. Hepatobiliary: There is morphologic changes of cirrhosis. A 2.2 x 2.3 cm low attenuating lesion in the  right lobe of the liver as seen on the prior CT and MRI. A smaller hypodense lesion is also seen inferior to this lesion. These lesions are not characterized on this CT. Please see abdominal MRI report on 12/16/2014. There is no intrahepatic biliary ductal dilatation. There is stone within the gallbladder. No pericholecystic fluid. Pancreas: There is a 1.9 x 2.6 cm low attenuating lesion along the inferior aspect of the body of the pancreas as seen on the prior MRI and characterize as cystic lesion. There is no dilatation of the main pancreatic duct. No peripancreatic stranding. Spleen: Splenomegaly measuring up to 19 cm in length (previously 18 cm). Adrenals/Urinary Tract: The adrenal glands are unremarkable. There is mild bilateral renal parenchymal atrophy. There is a 5.7 x 4.0 x 6.4 cm I is attenuating mass in the interpolar aspect the right kidney. There is associated mild mass effect and displacement of the renal hilum and collecting system. There is mild right hydronephrosis, new since the prior study. High attenuating content within the right renal collecting system and pelvis may represent blood product or other proteinaceous or pyogenic debris or less likely small stones. Correlation with urinalysis recommended. A pigtail right ureteral stent is noted with proximal tip at the right ureteropelvic junction in similar position as the prior CT. The distal end of the stent has shifted distally and now positioned in the membranous or bulbous urethra. Multiple  small exophytic left renal hypodense lesions are incompletely characterized but appear similar to prior CT. There is no hydronephrosis or nephrolithiasis on the left. The left ureter appears unremarkable. The urinary bladder is only partially distended and grossly unremarkable. Stomach/Bowel: There is extensive sigmoid diverticulosis without active inflammatory changes. No evidence of bowel obstruction or active inflammation. The appendix is not visualized with certainty. No inflammatory changes identified in the right lower quadrant. Vascular/Lymphatic: Evaluation of the vasculature is limited on this noncontrast study. There is advanced aortoiliac atherosclerotic disease. There is tortuosity of the abdominal aorta. There is a 3.1 cm infrarenal abdominal aortic aneurysm. There is aneurysmal dilatation of the right common iliac artery measuring up to 2.1 cm and the left common iliac artery measuring 2 cm. The IVC is grossly unremarkable. 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 Upper abdominal and perisplenic varices and splenorenal shunt noted. There is retroperitoneal adenopathy in the cavoatrial region similar to the CT of 01/01/2017 but new since CT of 11/17/2016. Reproductive: The prostate gland is not visualized, likely surgically absent. There is probable postsurgical changes of TURP. Other: Small fat containing umbilical hernia. Musculoskeletal: There is osteopenia with multilevel degenerative changes of the spine. No suspicious osseous lesion. Multilevel disc desiccation with vacuum phenomena. T12 and L3 compression deformities with anterior wedging similar to prior CT. No acute fracture. IMPRESSION: 1. Displacement of the distal tip of the right ureteral stent from the bladder into the membranous or bulbous urethra. Urology consult is advised. 2. Interval development of mild right hydronephrosis with high attenuating content in the right renal collecting system  likely blood products or other proteinaceous or pyogenic debris. Small stones are less likely. Correlation with urinalysis recommended. 3. Right renal mass similar to the prior CT. 4. Retroperitoneal adenopathy in the cavoatrial region similar to the CT of 01/01/2017 but progressed since 11/17/2016. 5. Cirrhosis with evidence of portal hypertension, splenomegaly, and upper abdominal varices. 6. Right hepatic lesions as seen on the prior CT and MRI, not characterized on the CT.  7. Cholelithiasis without evidence of acute cholecystitis by CT. 8. Advanced aortoiliac atherosclerotic disease and a 3.1 cm infrarenal abdominal aortic aneurysm as well as mild aneurysmal dilatation of the common iliac arteries. Recommend followup by ultrasound in 3 years. This recommendation follows ACR consensus guidelines: White Paper of the ACR Incidental Findings Committee II on Vascular Findings. J Am Coll Radiol 2013; 85:462-703 9. Stable appearing distal splenic artery aneurysm. 10. Colonic diverticulosis. No evidence of bowel obstruction or active inflammation. Electronically Signed   By: Anner Crete M.D.   On: 01/20/2017 21:25    Procedures Procedures (including critical care time)  Medications Ordered in ED Medications  fentaNYL (SUBLIMAZE) injection 100 mcg (100 mcg Intravenous Given 01/20/17 2221)  ondansetron (ZOFRAN) injection 4 mg (4 mg Intravenous Given 01/20/17 1959)  lidocaine (XYLOCAINE) 2 % jelly 1 application (1 application Urethral Given 01/20/17 2214)     Initial Impression / Assessment and Plan / ED Course  I have reviewed the triage vital signs and the nursing notes.  Pertinent labs & imaging results that were available during my care of the patient were reviewed by me and considered in my medical decision making (see chart for details).  Clinical Course as of Jan 20 2257  Fri Jan 20, 2017  2032 He states that his pain is much better now after the first dose of fentanyl.  [EW]  2032 Low  Hemoglobin: (!) 7.5 [EW]  2032 Abnormal Hgb urine dipstick: (!) LARGE [EW]  2257 Patient is more comfortable after insertion of Foley catheter, catheter placed to push the ureter stent out from the urethra where it had migrated to.  [EW]    Clinical Course User Index [EW] Daleen Bo, MD   Hemoglobin  Date Value Ref Range Status  01/20/2017 7.5 (L) 13.0 - 17.0 g/dL Final  01/04/2017 9.0 (L) 13.0 - 17.0 g/dL Final  01/03/2017 9.1 (L) 13.0 - 17.0 g/dL Final  01/02/2017 7.7 (L) 13.0 - 17.0 g/dL Final     Medications  fentaNYL (SUBLIMAZE) injection 100 mcg (100 mcg Intravenous Given 01/20/17 2221)  ondansetron (ZOFRAN) injection 4 mg (4 mg Intravenous Given 01/20/17 1959)  lidocaine (XYLOCAINE) 2 % jelly 1 application (1 application Urethral Given 01/20/17 2214)    Patient Vitals for the past 24 hrs:  BP Temp Temp src Pulse Resp SpO2  01/20/17 2125 138/86 98.6 F (37 C) Oral 103 22 99 %  01/20/17 2017 - - - - - 100 %  01/20/17 1950 178/83 98.5 F (36.9 C) Oral 86 24 100 %  01/20/17 1823 172/81 98.1 F (36.7 C) Oral 91 18 96 %    11:02 PM Reevaluation with update and discussion. After initial assessment and treatment, an updated evaluation reveals he is comfortable now.  Findings discussed with patient and wife, all questions answered. Cashel Bellina L    Final Clinical Impressions(s) / ED Diagnoses   Final diagnoses:  Gross hematuria  Iron deficiency anemia due to chronic blood loss   Hematuria, with anemia, decreased 2-1/2 g, since 01/04/2017, despite taking high-dose iron supplementation.  No hyportension or pre-syncopal symptoms at this time.  Patient is hemodynamically stable.  Patient improved after placement of Foley catheter.  He has short-term follow-up with his urologist scheduled for 3 days from now.  He will be discharged with a catheter in place.  Patient is informed of multiple incidental abnormalities on the CT, which will require him to follow-up with his PCP  about.  Nursing Notes Reviewed/ Care Coordinated Applicable Imaging Reviewed  Interpretation of Laboratory Data incorporated into ED treatment  The patient appears reasonably screened and/or stabilized for discharge and I doubt any other medical condition or other [facility] requiring further screening, evaluation, or treatment in the ED at this time prior to discharge.  Plan: Home Medications-continue; Home Treatments-Foley catheter care at home; return here if the recommended treatment, does not improve the symptoms; Recommended follow up-urology in 3 days and PCP in 1 or 2 weeks.    New Prescriptions New Prescriptions   HYDROCODONE-ACETAMINOPHEN (NORCO) 5-325 MG TABLET    Take 1 tablet by mouth every 4 (four) hours as needed.     Daleen Bo, MD 01/20/17 2303

## 2017-01-20 NOTE — Discharge Instructions (Signed)
See Dr. Tresa Moore on Monday as scheduled for a checkup.  Hopefully he will be able to remove the catheter then.  Return here, if needed, for problems.  The CT scan showed the the right ureter stent had migrated into the bladder.  The catheter was placed to push the stent back into the urinary bladder.  The CT scan also showed several minor abnormalities which you should discuss with your primary care doctor in 1 or 2 months.  These include liver cirrhosis, liver lesions, gallstones, and a small aortic aneurysm.  None of these require urgent treatment today, but should be monitored by your primary care doctor.

## 2017-01-20 NOTE — ED Notes (Signed)
Bladder Scanner: 22ML

## 2017-01-20 NOTE — ED Triage Notes (Signed)
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 He has surgery scheduled 3/7. He reports dribbling urine continuously until earlier today. Now the urine has stopped completely and he is bleeding. He is c/o R flank pain. Pt wears 3L O2 continuously at home. A&Ox4.

## 2017-01-23 ENCOUNTER — Encounter (HOSPITAL_COMMUNITY)
Admission: RE | Admit: 2017-01-23 | Discharge: 2017-01-23 | Disposition: A | Discharge status: Home / Self Care | Payer: Medicare Other | Source: Ambulatory Visit | Attending: Urology | Admitting: Urology

## 2017-01-23 ENCOUNTER — Other Ambulatory Visit: Payer: Self-pay | Admitting: Urology

## 2017-01-23 ENCOUNTER — Encounter (HOSPITAL_COMMUNITY): Payer: Self-pay

## 2017-01-23 DIAGNOSIS — C651 Malignant neoplasm of right renal pelvis: Secondary | ICD-10-CM | POA: Diagnosis not present

## 2017-01-23 DIAGNOSIS — Z01812 Encounter for preprocedural laboratory examination: Secondary | ICD-10-CM | POA: Diagnosis not present

## 2017-01-23 DIAGNOSIS — C672 Malignant neoplasm of lateral wall of bladder: Secondary | ICD-10-CM | POA: Diagnosis not present

## 2017-01-23 DIAGNOSIS — D62 Acute posthemorrhagic anemia: Secondary | ICD-10-CM | POA: Diagnosis not present

## 2017-01-23 DIAGNOSIS — R310 Gross hematuria: Secondary | ICD-10-CM | POA: Diagnosis not present

## 2017-01-23 LAB — CBC
HEMATOCRIT: 21.8 % — AB (ref 39.0–52.0)
Hemoglobin: 7.3 g/dL — ABNORMAL LOW (ref 13.0–17.0)
MCH: 31.6 pg (ref 26.0–34.0)
MCHC: 33.5 g/dL (ref 30.0–36.0)
MCV: 94.4 fL (ref 78.0–100.0)
PLATELETS: 188 10*3/uL (ref 150–400)
RBC: 2.31 MIL/uL — ABNORMAL LOW (ref 4.22–5.81)
RDW: 18.1 % — AB (ref 11.5–15.5)
WBC: 6.7 10*3/uL (ref 4.0–10.5)

## 2017-01-23 NOTE — Progress Notes (Addendum)
Spoke with Dr Penelope Coop of anesthesia concerning atelectasis on pt last CXR on 01-01-17 to determine if a repeat was necessary. No repeat ordered.    CBC results routed via EPIC to Dr Tresa Moore

## 2017-01-27 DIAGNOSIS — I872 Venous insufficiency (chronic) (peripheral): Secondary | ICD-10-CM | POA: Diagnosis not present

## 2017-01-27 DIAGNOSIS — I130 Hypertensive heart and chronic kidney disease with heart failure and stage 1 through stage 4 chronic kidney disease, or unspecified chronic kidney disease: Secondary | ICD-10-CM | POA: Diagnosis not present

## 2017-01-27 DIAGNOSIS — J449 Chronic obstructive pulmonary disease, unspecified: Secondary | ICD-10-CM | POA: Diagnosis not present

## 2017-01-27 DIAGNOSIS — C679 Malignant neoplasm of bladder, unspecified: Secondary | ICD-10-CM | POA: Diagnosis not present

## 2017-01-27 DIAGNOSIS — I5033 Acute on chronic diastolic (congestive) heart failure: Secondary | ICD-10-CM | POA: Diagnosis not present

## 2017-01-27 DIAGNOSIS — D509 Iron deficiency anemia, unspecified: Secondary | ICD-10-CM | POA: Diagnosis not present

## 2017-01-31 ENCOUNTER — Encounter (HOSPITAL_COMMUNITY): Payer: Self-pay | Admitting: *Deleted

## 2017-01-31 ENCOUNTER — Inpatient Hospital Stay (HOSPITAL_COMMUNITY)
Admission: RE | Admit: 2017-01-31 | Discharge: 2017-02-04 | DRG: 657 | Disposition: A | Discharge status: Skilled Nursing Facility | Payer: Medicare Other | Source: Ambulatory Visit | Attending: Urology | Admitting: Urology

## 2017-01-31 DIAGNOSIS — C679 Malignant neoplasm of bladder, unspecified: Secondary | ICD-10-CM | POA: Diagnosis present

## 2017-01-31 DIAGNOSIS — N189 Chronic kidney disease, unspecified: Secondary | ICD-10-CM | POA: Diagnosis present

## 2017-01-31 DIAGNOSIS — Z9079 Acquired absence of other genital organ(s): Secondary | ICD-10-CM

## 2017-01-31 DIAGNOSIS — E785 Hyperlipidemia, unspecified: Secondary | ICD-10-CM | POA: Diagnosis present

## 2017-01-31 DIAGNOSIS — K210 Gastro-esophageal reflux disease with esophagitis: Secondary | ICD-10-CM | POA: Diagnosis not present

## 2017-01-31 DIAGNOSIS — I509 Heart failure, unspecified: Secondary | ICD-10-CM | POA: Diagnosis not present

## 2017-01-31 DIAGNOSIS — Z6841 Body Mass Index (BMI) 40.0 and over, adult: Secondary | ICD-10-CM | POA: Diagnosis not present

## 2017-01-31 DIAGNOSIS — K219 Gastro-esophageal reflux disease without esophagitis: Secondary | ICD-10-CM | POA: Diagnosis present

## 2017-01-31 DIAGNOSIS — I959 Hypotension, unspecified: Secondary | ICD-10-CM | POA: Diagnosis not present

## 2017-01-31 DIAGNOSIS — C651 Malignant neoplasm of right renal pelvis: Secondary | ICD-10-CM | POA: Diagnosis not present

## 2017-01-31 DIAGNOSIS — D509 Iron deficiency anemia, unspecified: Secondary | ICD-10-CM | POA: Diagnosis not present

## 2017-01-31 DIAGNOSIS — Z87891 Personal history of nicotine dependence: Secondary | ICD-10-CM | POA: Diagnosis not present

## 2017-01-31 DIAGNOSIS — D500 Iron deficiency anemia secondary to blood loss (chronic): Secondary | ICD-10-CM | POA: Diagnosis present

## 2017-01-31 DIAGNOSIS — I130 Hypertensive heart and chronic kidney disease with heart failure and stage 1 through stage 4 chronic kidney disease, or unspecified chronic kidney disease: Secondary | ICD-10-CM | POA: Diagnosis present

## 2017-01-31 DIAGNOSIS — Z9981 Dependence on supplemental oxygen: Secondary | ICD-10-CM | POA: Diagnosis not present

## 2017-01-31 DIAGNOSIS — E7800 Pure hypercholesterolemia, unspecified: Secondary | ICD-10-CM | POA: Diagnosis not present

## 2017-01-31 DIAGNOSIS — N2889 Other specified disorders of kidney and ureter: Secondary | ICD-10-CM | POA: Diagnosis not present

## 2017-01-31 DIAGNOSIS — N289 Disorder of kidney and ureter, unspecified: Secondary | ICD-10-CM | POA: Diagnosis not present

## 2017-01-31 DIAGNOSIS — R2681 Unsteadiness on feet: Secondary | ICD-10-CM | POA: Diagnosis not present

## 2017-01-31 DIAGNOSIS — Z905 Acquired absence of kidney: Secondary | ICD-10-CM

## 2017-01-31 DIAGNOSIS — J449 Chronic obstructive pulmonary disease, unspecified: Secondary | ICD-10-CM | POA: Diagnosis present

## 2017-01-31 DIAGNOSIS — Z906 Acquired absence of other parts of urinary tract: Secondary | ICD-10-CM

## 2017-01-31 DIAGNOSIS — M6281 Muscle weakness (generalized): Secondary | ICD-10-CM | POA: Diagnosis not present

## 2017-01-31 DIAGNOSIS — Z960 Presence of urogenital implants: Secondary | ICD-10-CM | POA: Diagnosis not present

## 2017-01-31 DIAGNOSIS — R52 Pain, unspecified: Secondary | ICD-10-CM

## 2017-01-31 DIAGNOSIS — C641 Malignant neoplasm of right kidney, except renal pelvis: Secondary | ICD-10-CM | POA: Diagnosis not present

## 2017-01-31 DIAGNOSIS — E876 Hypokalemia: Secondary | ICD-10-CM | POA: Diagnosis not present

## 2017-01-31 DIAGNOSIS — N401 Enlarged prostate with lower urinary tract symptoms: Secondary | ICD-10-CM | POA: Diagnosis not present

## 2017-01-31 DIAGNOSIS — N400 Enlarged prostate without lower urinary tract symptoms: Secondary | ICD-10-CM | POA: Diagnosis present

## 2017-01-31 DIAGNOSIS — C801 Malignant (primary) neoplasm, unspecified: Secondary | ICD-10-CM | POA: Diagnosis not present

## 2017-01-31 DIAGNOSIS — K5900 Constipation, unspecified: Secondary | ICD-10-CM | POA: Diagnosis not present

## 2017-01-31 DIAGNOSIS — A499 Bacterial infection, unspecified: Secondary | ICD-10-CM | POA: Diagnosis not present

## 2017-01-31 DIAGNOSIS — I10 Essential (primary) hypertension: Secondary | ICD-10-CM | POA: Diagnosis not present

## 2017-01-31 DIAGNOSIS — R0602 Shortness of breath: Secondary | ICD-10-CM | POA: Diagnosis not present

## 2017-01-31 LAB — COMPREHENSIVE METABOLIC PANEL
ALK PHOS: 162 U/L — AB (ref 38–126)
ALT: 23 U/L (ref 17–63)
AST: 31 U/L (ref 15–41)
Albumin: 3 g/dL — ABNORMAL LOW (ref 3.5–5.0)
Anion gap: 7 (ref 5–15)
BILIRUBIN TOTAL: 0.7 mg/dL (ref 0.3–1.2)
BUN: 8 mg/dL (ref 6–20)
CALCIUM: 8.6 mg/dL — AB (ref 8.9–10.3)
CO2: 28 mmol/L (ref 22–32)
Chloride: 98 mmol/L — ABNORMAL LOW (ref 101–111)
Creatinine, Ser: 0.83 mg/dL (ref 0.61–1.24)
GFR calc non Af Amer: >60 mL/min (ref >60)
Glucose, Bld: 132 mg/dL — ABNORMAL HIGH (ref 65–99)
Potassium: 3.1 mmol/L — ABNORMAL LOW (ref 3.5–5.1)
SODIUM: 133 mmol/L — AB (ref 135–145)
TOTAL PROTEIN: 6.2 g/dL — AB (ref 6.5–8.1)

## 2017-01-31 LAB — CBC
HEMATOCRIT: 23.7 % — AB (ref 39.0–52.0)
Hemoglobin: 7.7 g/dL — ABNORMAL LOW (ref 13.0–17.0)
MCH: 31 pg (ref 26.0–34.0)
MCHC: 32.5 g/dL (ref 30.0–36.0)
MCV: 95.6 fL (ref 78.0–100.0)
Platelets: 184 10*3/uL (ref 150–400)
RBC: 2.48 MIL/uL — AB (ref 4.22–5.81)
RDW: 16.5 % — ABNORMAL HIGH (ref 11.5–15.5)
WBC: 4.4 10*3/uL (ref 4.0–10.5)

## 2017-01-31 LAB — SURGICAL PCR SCREEN
MRSA, PCR: NEGATIVE
Staphylococcus aureus: NEGATIVE

## 2017-01-31 LAB — PREPARE RBC (CROSSMATCH)

## 2017-01-31 MED ORDER — ORAL CARE MOUTH RINSE
15.0000 mL | Freq: Two times a day (BID) | OROMUCOSAL | Status: DC
  Administered 2017-01-31: 15 mL via OROMUCOSAL

## 2017-01-31 MED ORDER — SODIUM CHLORIDE 0.9 % IV SOLN
Freq: Once | INTRAVENOUS | Status: AC
  Administered 2017-02-01: 10:00:00 via INTRAVENOUS

## 2017-01-31 MED ORDER — ACETAMINOPHEN 325 MG PO TABS
650.0000 mg | ORAL_TABLET | Freq: Four times a day (QID) | ORAL | Status: DC | PRN
  Administered 2017-01-31: 650 mg via ORAL
  Filled 2017-01-31: qty 2

## 2017-01-31 MED ORDER — ENSURE ENLIVE PO LIQD
237.0000 mL | Freq: Two times a day (BID) | ORAL | Status: DC
  Administered 2017-02-02: 237 mL via ORAL

## 2017-01-31 MED ORDER — BOOST / RESOURCE BREEZE PO LIQD
1.0000 | Freq: Three times a day (TID) | ORAL | Status: DC
  Administered 2017-01-31 – 2017-02-02 (×4): 1 via ORAL
  Filled 2017-01-31 (×2): qty 1

## 2017-01-31 MED ORDER — MAGNESIUM CITRATE PO SOLN
1.0000 | Freq: Once | ORAL | Status: AC
  Administered 2017-01-31: 1 via ORAL
  Filled 2017-01-31: qty 296

## 2017-01-31 NOTE — Progress Notes (Signed)
[facility] for orders. Stacey Drain

## 2017-01-31 NOTE — H&P (Signed)
Victor Wang is an 82 y.o. male.    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 Former smoker and cytoxan use. Most recently with   3-4 cm Rt renal pelvis enhancing mass worrisome for upper tract TCC on eva new gross hematuria. 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 Stent removed in office early 01/2017 as it had become distally displaced.    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 Most recent Cr 1-1.4's.    3- Prostatic Hyperplasia - s/p TURP previously years ago, re-TURP 08/2015 as part of TURBT of tumor arising from right prostatic lobe urothelium.   4-  Chronic Blood Loss Anemia - Hgb 7-9's following ongoing blood loss from right kidney cancer. s  He is quite sensitive to anemia having worsening fatigue when Hgb <9. He has been on PO Fe.   PMH sig for significant obesity, HTN, Appy, Progressive COPD/obesity hypoventilation (O2 at times, CPAP). Denies CVA or ischemic heart disease. His PCP is Dr Ula Lingo   Today "[name]" is seen as pre-op admission for labs, bowel prep, transfusiion prior to right nephro-ureterectomy tomorrow. No interval fevers, CP, or breathing difficulties.   Past Medical History:  Diagnosis Date  . Anemia   . Arthritis   . Bladder cancer (HCC)    RECURRENT  . BPH (benign prostatic hypertrophy)   . CHF (congestive heart failure) ([place])   . Clot 2007    kidney biopsy done  . Complication of anesthesia    oxygen level drops when put to sleep , has some bleeding issues after every surgery with cautery required  . COPD (chronic obstructive pulmonary disease) ([place])   . Dyspnea    exertion; can not complete a flight of stairs without stopping to catch his breath   . Full dentures   . GERD  (gastroesophageal reflux disease)   . History of blood transfusion last done 11-13-16   total of 7 units   . History of compression fracture of spine    04/2013   T12  . Hyperlipidemia   . Hypertension   . Nephritis 2007   oral chemo and prednisone done  . Pneumonia last 2016   x 2 in 2016  . PONV (postoperative nausea and vomiting)   . Pulmonary hypertension    mild per 12-15-15 echo [place]  . Renal mass    right  . Sleep apnea     Past Surgical History:  Procedure Laterality Date  . APPENDECTOMY  1985  . CARPAL TUNNEL RELEASE Right 02-05-2008  . CYSTOSCOPY W/ RETROGRADES Bilateral 10/29/2014   Procedure: CYSTOSCOPY WITH RETROGRADE PYELOGRAM;  Surgeon: Alexis Frock, MD;  Location: [facility];  Service: Urology;  Laterality: Bilateral;  . CYSTOSCOPY W/ RETROGRADES Bilateral 09/10/2015   Procedure: CYSTOSCOPY WITH RETROGRADE PYELOGRAM;  Surgeon: Alexis Frock, MD;  Location: [facility];  Service: Urology;  Laterality: Bilateral;  . CYSTOSCOPY WITH BIOPSY N/A 05/26/2014   Procedure: CYSTOSCOPY WITH BIOPSY;  Surgeon: Sharyn Creamer, MD;  Location: [facility];  Service: Urology;  Laterality: N/A;  . CYSTOSCOPY WITH RETROGRADE PYELOGRAM, URETEROSCOPY AND STENT PLACEMENT Bilateral 04/09/2014   Procedure: CYSTOSCOPY WITH RETROGRADE PYELOGRAM, POSSIBLE URETEROSCOPY WITH BIOPSY AND STENT PLACEMENT;  Surgeon: Sharyn Creamer, MD;  Location:  [place] [facility];  Service: Urology;  Laterality: Bilateral;  . CYSTOSCOPY/RETROGRADE/URETEROSCOPY Right 12/28/2016   Procedure: CYSTOSCOPY/RETROGRADE/URETEROSCOPY WITH BIOPSY right renal pelvis insertion double j stent;  Surgeon: Alexis Frock, MD;  Location: [facility];  Service: Urology;  Laterality: Right;  . KNEE LIGAMENT RECONSTRUCTION Right 1970  . RIGHT SHOULDER SURGERY  2011  . TONSILLECTOMY AND ADENOIDECTOMY  CHILD   and adenoids  . TRANSURETHRAL RESECTION OF BLADDER TUMOR  WITH GYRUS (TURBT-GYRUS) Bilateral 04/09/2014   Procedure: TRANSURETHRAL RESECTION OF BLADDER TUMOR WITH GYRUS (TURBT-GYRUS);  Surgeon: Sharyn Creamer, MD;  Location: [facility];  Service: Urology;  Laterality: Bilateral;  . TRANSURETHRAL RESECTION OF BLADDER TUMOR WITH GYRUS (TURBT-GYRUS) N/A 10/29/2014   Procedure: TRANSURETHRAL RESECTION OF BLADDER TUMOR WITH GYRUS (TURBT-GYRUS);  Surgeon: Alexis Frock, MD;  Location: [facility];  Service: Urology;  Laterality: N/A;  . TRANSURETHRAL RESECTION OF BLADDER TUMOR WITH GYRUS (TURBT-GYRUS) N/A 09/10/2015   Procedure: TRANSURETHRAL RESECTION OF BLADDER TUMOR ;  Surgeon: Alexis Frock, MD;  Location: [facility];  Service: Urology;  Laterality: N/A;  . TRANSURETHRAL RESECTION OF PROSTATE  2006   AND    POST CAURTERIZATION OF BLEEDERS  . TRANSURETHRAL RESECTION OF PROSTATE N/A 09/10/2015   Procedure: TRANSURETHRAL RESECTION OF THE PROSTATE ;  Surgeon: Alexis Frock, MD;  Location: [facility];  Service: Urology;  Laterality: N/A;    Family History  Problem Relation Age of Onset  . Congestive Heart Failure Mother   . Bladder Cancer Neg Hx    Social History:  reports that he quit smoking about 31 years ago. His smoking use included Cigarettes. He has a 52.50 pack-year smoking history. He has never used smokeless tobacco. He reports that he drinks about 2.4 oz of alcohol per week . He reports that he does not use drugs.  Allergies:  Allergies  Allergen Reactions  . Bee Venom Anaphylaxis  . Penicillins Anaphylaxis, Hives and Other (See Comments)    Has patient had a PCN reaction causing immediate rash, facial/tongue/throat swelling, SOB or lightheadedness with hypotension: Yes Has patient had a PCN reaction causing severe rash involving mucus membranes or skin necrosis: Yes Has patient had a PCN reaction that required hospitalization No Has patient had a PCN reaction occurring within  the last 10 years: No If all of the above answers are "NO", then may proceed with Cephalosporin use.   . Tramadol Other (See Comments)    Dizzy and loopy.     Medications Prior to Admission  Medication Sig Dispense Refill  . acetaminophen (TYLENOL) 500 MG tablet Take 1,000 mg by mouth every 6 (six) hours as needed for moderate pain.    Marland Kitchen albuterol (PROVENTIL HFA;VENTOLIN HFA) 108 (90 Base) MCG/ACT inhaler Inhale 1-2 puffs into the lungs every 6 (six) hours as needed for wheezing or shortness of breath.    . allopurinol (ZYLOPRIM) 100 MG tablet Take 100 mg by mouth daily.     . ferrous sulfate 325 (65 FE) MG tablet Take 1 tablet (325 mg total) by mouth 3 (three) times daily with meals. 90 tablet 0  . furosemide (LASIX) 40 MG tablet Take 40 mg by mouth 2 (two) times daily.    Marland Kitchen ipratropium-albuterol (DUONEB) 0.5-2.5 (3) MG/3ML SOLN Take 3 mLs by nebulization every 6 (six) hours as needed (for wheezing/shortness of breath).    . metoprolol (LOPRESSOR) 50 MG tablet Take 50 mg by mouth daily.    Marland Kitchen omeprazole (PRILOSEC) 20 MG capsule  Take 20 mg by mouth daily.     . polyethylene glycol (MIRALAX / GLYCOLAX) packet Take 17 g by mouth daily. 30 each 0  . potassium chloride SA (K-DUR,KLOR-CON) 20 MEQ tablet Take 20 mEq by mouth daily.    . pravastatin (PRAVACHOL) 80 MG tablet Take 80 mg by mouth daily.     . Calcium-Magnesium-Vitamin D (CALCIUM 1200+D3 PO) Take 1 tablet by mouth daily.    . Coenzyme Q10 (COQ10) 100 MG CAPS Take 100 mg by mouth daily.    Marland Kitchen HYDROcodone-acetaminophen (NORCO) 5-325 MG tablet Take 1 tablet by mouth every 4 (four) hours as needed. (Patient not taking: Reported on 01/23/2017) 20 tablet 0    Results for orders placed or performed during the hospital encounter of 01/31/17 (from the past 48 hour(s))  CBC     Status: Abnormal   Collection Time: 01/31/17  3:05 PM  Result Value Ref Range   WBC 4.4 4.0 - 10.5 K/uL   RBC 2.48 (L) 4.22 - 5.81 MIL/uL   Hemoglobin 7.7 (L) 13.0 -  17.0 g/dL   HCT 23.7 (L) 39.0 - 52.0 %   MCV 95.6 78.0 - 100.0 fL   MCH 31.0 26.0 - 34.0 pg   MCHC 32.5 30.0 - 36.0 g/dL   RDW 16.5 (H) 11.5 - 15.5 %   Platelets 184 150 - 400 K/uL  Comprehensive metabolic panel     Status: Abnormal   Collection Time: 01/31/17  3:05 PM  Result Value Ref Range   Sodium 133 (L) 135 - 145 mmol/L   Potassium 3.1 (L) 3.5 - 5.1 mmol/L   Chloride 98 (L) 101 - 111 mmol/L   CO2 28 22 - 32 mmol/L   Glucose, Bld 132 (H) 65 - 99 mg/dL   BUN 8 6 - 20 mg/dL   Creatinine, Ser 0.83 0.61 - 1.24 mg/dL   Calcium 8.6 (L) 8.9 - 10.3 mg/dL   Total Protein 6.2 (L) 6.5 - 8.1 g/dL   Albumin 3.0 (L) 3.5 - 5.0 g/dL   AST 31 15 - 41 U/L   ALT 23 17 - 63 U/L   Alkaline Phosphatase 162 (H) 38 - 126 U/L   Total Bilirubin 0.7 0.3 - 1.2 mg/dL   GFR calc non Af Amer >60 >60 mL/min   GFR calc Af Amer >60 >60 mL/min    Comment: (NOTE) The eGFR has been calculated using the CKD EPI equation. This calculation has not been validated in all clinical situations. eGFR's persistently <60 mL/min signify possible Chronic Kidney Disease.    Anion gap 7 5 - 15  Prepare RBC (crossmatch)     Status: None   Collection Time: 01/31/17  3:49 PM  Result Value Ref Range   Order Confirmation ORDER PROCESSED BY BLOOD BANK   Type and screen [place]     Status: None (Preliminary result)   Collection Time: 01/31/17  3:49 PM  Result Value Ref Range   ABO/RH(D) A POS    Antibody Screen NEG    Sample Expiration 02/03/2017    Unit Number [id number]    Blood Component Type RED CELLS,LR    Unit division 00    Status of Unit ALLOCATED    Transfusion Status OK TO TRANSFUSE    Crossmatch Result Compatible    Unit Number [id number]    Blood Component Type RED CELLS,LR    Unit division 00    Status of Unit ALLOCATED    Transfusion Status OK TO TRANSFUSE  Crossmatch Result Compatible    Unit Number [id number]    Blood Component Type RED CELLS,LR    Unit division  00    Status of Unit ALLOCATED    Transfusion Status OK TO TRANSFUSE    Crossmatch Result Compatible    No results found.  Review of Systems  Constitutional: Negative.  Negative for chills and fever.  HENT: Negative.   Eyes: Negative.   Respiratory: Negative.   Cardiovascular: Negative.   Gastrointestinal: Negative.  Negative for nausea and vomiting.  Genitourinary: Positive for hematuria. Negative for flank pain.  Musculoskeletal: Negative.   Skin: Negative.   Neurological: Negative.   Endo/Heme/Allergies: Negative.   Psychiatric/Behavioral: Negative.     Blood pressure (!) 143/79, pulse 88, temperature 98.2 F (36.8 C), temperature source Oral, resp. rate 19, height 6' 1"  (1.854 m), weight (!) 142.7 kg (314 lb 11.2 oz), SpO2 100 %. Physical Exam  Constitutional: He is oriented to person, place, and time. He appears well-developed.  HENT:  Head: Normocephalic.  Eyes: Pupils are equal, round, and reactive to light.  Neck: Normal range of motion.  Cardiovascular: Normal rate.   Respiratory: Effort normal.  GI: Soft.  Morbid truncal obesity that is stable.   Genitourinary: Penis normal.  Genitourinary Comments: No CVAT at present.   Neurological: He is alert and oriented to person, place, and time.  Skin: Skin is warm.  Psychiatric: He has a normal mood and affect. His behavior is normal. Thought content normal.     Assessment/Plan  Proceed as planned with RIGHT nephroureterectomy tomorrow. We have discussed risks, benefits, alternatives and peri-operative course extensively previously and he has very good understanding. He aslo has very good understnading that his baseline pulm, CV disease, and obesity increase his risk of ALL complications several fold.   2upRBC tonight with H/H post-transfusion prior to surgery tomorrow.    Alexis Frock, MD 01/31/2017, 7:37 PM

## 2017-02-01 ENCOUNTER — Inpatient Hospital Stay (HOSPITAL_COMMUNITY): Payer: Medicare Other | Admitting: Anesthesiology

## 2017-02-01 ENCOUNTER — Inpatient Hospital Stay (HOSPITAL_COMMUNITY): Payer: Medicare Other

## 2017-02-01 ENCOUNTER — Encounter (HOSPITAL_COMMUNITY)
Admission: RE | Disposition: A | Discharge status: Skilled Nursing Facility | Payer: Self-pay | Source: Ambulatory Visit | Attending: Urology

## 2017-02-01 DIAGNOSIS — Z906 Acquired absence of other parts of urinary tract: Secondary | ICD-10-CM

## 2017-02-01 DIAGNOSIS — Z905 Acquired absence of kidney: Secondary | ICD-10-CM

## 2017-02-01 HISTORY — PX: CYSTOSCOPY WITH RETROGRADE PYELOGRAM, URETEROSCOPY AND STENT PLACEMENT: SHX5789

## 2017-02-01 HISTORY — PX: ROBOT ASSITED LAPAROSCOPIC NEPHROURETERECTOMY: SHX6077

## 2017-02-01 LAB — CBC
HEMATOCRIT: 27.3 % — AB (ref 39.0–52.0)
Hemoglobin: 8.7 g/dL — ABNORMAL LOW (ref 13.0–17.0)
MCH: 29.3 pg (ref 26.0–34.0)
MCHC: 31.9 g/dL (ref 30.0–36.0)
MCV: 91.9 fL (ref 78.0–100.0)
PLATELETS: 172 10*3/uL (ref 150–400)
RBC: 2.97 MIL/uL — ABNORMAL LOW (ref 4.22–5.81)
RDW: 17.1 % — ABNORMAL HIGH (ref 11.5–15.5)
WBC: 4 10*3/uL (ref 4.0–10.5)

## 2017-02-01 LAB — HEMOGLOBIN AND HEMATOCRIT, BLOOD
HCT: 32.5 % — ABNORMAL LOW (ref 39.0–52.0)
Hemoglobin: 10.4 g/dL — ABNORMAL LOW (ref 13.0–17.0)

## 2017-02-01 SURGERY — NEPHROURETERECTOMY, ROBOT-ASSISTED, LAPAROSCOPIC
Anesthesia: General | Laterality: Right

## 2017-02-01 MED ORDER — DEXAMETHASONE SODIUM PHOSPHATE 10 MG/ML IJ SOLN
INTRAMUSCULAR | Status: DC | PRN
  Administered 2017-02-01: 10 mg via INTRAVENOUS

## 2017-02-01 MED ORDER — ONDANSETRON HCL 4 MG/2ML IJ SOLN: 4.0000 mg | INTRAMUSCULAR | Status: DC | PRN

## 2017-02-01 MED ORDER — FENTANYL CITRATE (PF) 100 MCG/2ML IJ SOLN
INTRAMUSCULAR | Status: DC | PRN
  Administered 2017-02-01 (×4): 25 ug via INTRAVENOUS

## 2017-02-01 MED ORDER — CLINDAMYCIN PHOSPHATE 900 MG/50ML IV SOLN
INTRAVENOUS | Status: AC
  Filled 2017-02-01: qty 50

## 2017-02-01 MED ORDER — FENTANYL CITRATE (PF) 100 MCG/2ML IJ SOLN
INTRAMUSCULAR | Status: AC
  Filled 2017-02-01: qty 2

## 2017-02-01 MED ORDER — SUCCINYLCHOLINE CHLORIDE 200 MG/10ML IV SOSY
PREFILLED_SYRINGE | INTRAVENOUS | Status: AC
  Filled 2017-02-01: qty 10

## 2017-02-01 MED ORDER — SUGAMMADEX SODIUM 200 MG/2ML IV SOLN
INTRAVENOUS | Status: DC | PRN
  Administered 2017-02-01: 400 mg via INTRAVENOUS

## 2017-02-01 MED ORDER — SUGAMMADEX SODIUM 200 MG/2ML IV SOLN
INTRAVENOUS | Status: AC
  Filled 2017-02-01: qty 2

## 2017-02-01 MED ORDER — SODIUM CHLORIDE 0.9 % IJ SOLN
INTRAMUSCULAR | Status: AC
  Filled 2017-02-01: qty 10

## 2017-02-01 MED ORDER — HYDRALAZINE HCL 20 MG/ML IJ SOLN
INTRAMUSCULAR | Status: AC
  Filled 2017-02-01: qty 1

## 2017-02-01 MED ORDER — SALINE SPRAY 0.65 % NA SOLN
1.0000 | NASAL | Status: DC | PRN
  Filled 2017-02-01: qty 44

## 2017-02-01 MED ORDER — DEXAMETHASONE SODIUM PHOSPHATE 10 MG/ML IJ SOLN
INTRAMUSCULAR | Status: AC
  Filled 2017-02-01: qty 1

## 2017-02-01 MED ORDER — SODIUM CHLORIDE 0.45 % IV SOLN
INTRAVENOUS | Status: DC
  Administered 2017-02-01: 14:00:00 via INTRAVENOUS

## 2017-02-01 MED ORDER — DIPHENHYDRAMINE HCL 50 MG/ML IJ SOLN: 12.5000 mg | Freq: Four times a day (QID) | INTRAMUSCULAR | Status: DC | PRN

## 2017-02-01 MED ORDER — SODIUM CHLORIDE 0.9 % IJ SOLN
INTRAMUSCULAR | Status: DC | PRN
  Administered 2017-02-01: 20 mL

## 2017-02-01 MED ORDER — HYDROCODONE-ACETAMINOPHEN 5-325 MG PO TABS: 1.0000 | ORAL_TABLET | Freq: Four times a day (QID) | ORAL | 0 refills | Status: AC | PRN

## 2017-02-01 MED ORDER — SODIUM CHLORIDE 0.9 % IR SOLN
Status: DC | PRN
  Administered 2017-02-01: 1000 mL via INTRAVESICAL

## 2017-02-01 MED ORDER — PRAVASTATIN SODIUM 40 MG PO TABS
80.0000 mg | ORAL_TABLET | Freq: Every day | ORAL | Status: DC
  Administered 2017-02-01 – 2017-02-04 (×4): 80 mg via ORAL
  Filled 2017-02-01: qty 4
  Filled 2017-02-01: qty 2
  Filled 2017-02-01: qty 4
  Filled 2017-02-01: qty 2

## 2017-02-01 MED ORDER — ROCURONIUM BROMIDE 50 MG/5ML IV SOSY
PREFILLED_SYRINGE | INTRAVENOUS | Status: AC
Start: 2017-02-01 — End: 2017-02-01
  Filled 2017-02-01: qty 5

## 2017-02-01 MED ORDER — ONDANSETRON HCL 4 MG/2ML IJ SOLN
INTRAMUSCULAR | Status: DC | PRN
  Administered 2017-02-01: 4 mg via INTRAVENOUS

## 2017-02-01 MED ORDER — ALBUTEROL SULFATE (2.5 MG/3ML) 0.083% IN NEBU: 2.5000 mg | INHALATION_SOLUTION | Freq: Four times a day (QID) | RESPIRATORY_TRACT | Status: DC | PRN

## 2017-02-01 MED ORDER — CLINDAMYCIN PHOSPHATE 900 MG/50ML IV SOLN
900.0000 mg | Freq: Once | INTRAVENOUS | Status: AC
  Administered 2017-02-01: 900 mg via INTRAVENOUS

## 2017-02-01 MED ORDER — FUROSEMIDE 10 MG/ML IJ SOLN
20.0000 mg | Freq: Once | INTRAMUSCULAR | Status: AC
  Administered 2017-02-01: 20 mg via INTRAVENOUS

## 2017-02-01 MED ORDER — METOPROLOL TARTRATE 50 MG PO TABS
50.0000 mg | ORAL_TABLET | Freq: Every day | ORAL | Status: DC
  Administered 2017-02-01 – 2017-02-04 (×4): 50 mg via ORAL
  Filled 2017-02-01 (×2): qty 2
  Filled 2017-02-01 (×2): qty 1

## 2017-02-01 MED ORDER — ALLOPURINOL 100 MG PO TABS
100.0000 mg | ORAL_TABLET | Freq: Every day | ORAL | Status: DC
  Administered 2017-02-01 – 2017-02-04 (×4): 100 mg via ORAL
  Filled 2017-02-01 (×4): qty 1

## 2017-02-01 MED ORDER — BUPIVACAINE LIPOSOME 1.3 % IJ SUSP
INTRAMUSCULAR | Status: DC | PRN
  Administered 2017-02-01: 20 mL

## 2017-02-01 MED ORDER — ROCURONIUM BROMIDE 50 MG/5ML IV SOSY
PREFILLED_SYRINGE | INTRAVENOUS | Status: DC | PRN
  Administered 2017-02-01: 10 mg via INTRAVENOUS
  Administered 2017-02-01: 40 mg via INTRAVENOUS
  Administered 2017-02-01: 20 mg via INTRAVENOUS
  Administered 2017-02-01: 10 mg via INTRAVENOUS

## 2017-02-01 MED ORDER — HYDRALAZINE HCL 20 MG/ML IJ SOLN
10.0000 mg | Freq: Once | INTRAMUSCULAR | Status: AC
  Administered 2017-02-01: 10 mg via INTRAVENOUS

## 2017-02-01 MED ORDER — HYDROCODONE-ACETAMINOPHEN 5-325 MG PO TABS: 1.0000 | ORAL_TABLET | Freq: Four times a day (QID) | ORAL | 0 refills | Status: DC | PRN

## 2017-02-01 MED ORDER — HYDROMORPHONE HCL 1 MG/ML IJ SOLN: 0.2500 mg | INTRAMUSCULAR | Status: DC | PRN

## 2017-02-01 MED ORDER — LIDOCAINE 2% (20 MG/ML) 5 ML SYRINGE
INTRAMUSCULAR | Status: AC
  Filled 2017-02-01: qty 5

## 2017-02-01 MED ORDER — IPRATROPIUM-ALBUTEROL 0.5-2.5 (3) MG/3ML IN SOLN: 3.0000 mL | Freq: Four times a day (QID) | RESPIRATORY_TRACT | Status: DC | PRN

## 2017-02-01 MED ORDER — ONDANSETRON HCL 4 MG/2ML IJ SOLN
INTRAMUSCULAR | Status: AC
  Filled 2017-02-01: qty 2

## 2017-02-01 MED ORDER — FUROSEMIDE 10 MG/ML IJ SOLN
INTRAMUSCULAR | Status: AC
  Filled 2017-02-01: qty 2

## 2017-02-01 MED ORDER — IOHEXOL 300 MG/ML  SOLN
INTRAMUSCULAR | Status: DC | PRN
  Administered 2017-02-01: 10 mL

## 2017-02-01 MED ORDER — ROCURONIUM BROMIDE 50 MG/5ML IV SOSY
PREFILLED_SYRINGE | INTRAVENOUS | Status: AC
  Filled 2017-02-01: qty 5

## 2017-02-01 MED ORDER — FENTANYL CITRATE (PF) 100 MCG/2ML IJ SOLN
25.0000 ug | INTRAMUSCULAR | Status: DC | PRN
  Administered 2017-02-01 (×2): 25 ug via INTRAVENOUS
  Administered 2017-02-01: 50 ug via INTRAVENOUS

## 2017-02-01 MED ORDER — LACTATED RINGERS IV SOLN: INTRAVENOUS | Status: DC

## 2017-02-01 MED ORDER — FUROSEMIDE 40 MG PO TABS
40.0000 mg | ORAL_TABLET | Freq: Two times a day (BID) | ORAL | Status: DC
  Administered 2017-02-01 – 2017-02-04 (×6): 40 mg via ORAL
  Filled 2017-02-01 (×6): qty 1

## 2017-02-01 MED ORDER — LIDOCAINE 2% (20 MG/ML) 5 ML SYRINGE
INTRAMUSCULAR | Status: DC | PRN
  Administered 2017-02-01: 60 mg via INTRAVENOUS

## 2017-02-01 MED ORDER — LACTATED RINGERS IR SOLN
Status: DC | PRN
  Administered 2017-02-01: 1000 mL

## 2017-02-01 MED ORDER — LACTATED RINGERS IV SOLN
INTRAVENOUS | Status: DC | PRN
  Administered 2017-02-01: 08:00:00 via INTRAVENOUS

## 2017-02-01 MED ORDER — PHENYLEPHRINE HCL 10 MG/ML IJ SOLN
INTRAMUSCULAR | Status: AC
  Filled 2017-02-01: qty 1

## 2017-02-01 MED ORDER — PROMETHAZINE HCL 25 MG/ML IJ SOLN: 6.2500 mg | INTRAMUSCULAR | Status: DC | PRN

## 2017-02-01 MED ORDER — PROPOFOL 10 MG/ML IV BOLUS
INTRAVENOUS | Status: AC
  Filled 2017-02-01: qty 20

## 2017-02-01 MED ORDER — OXYCODONE HCL 5 MG PO TABS
5.0000 mg | ORAL_TABLET | ORAL | Status: DC | PRN
  Administered 2017-02-01 – 2017-02-04 (×8): 5 mg via ORAL
  Filled 2017-02-01 (×8): qty 1

## 2017-02-01 MED ORDER — PANTOPRAZOLE SODIUM 40 MG PO TBEC
40.0000 mg | DELAYED_RELEASE_TABLET | Freq: Every day | ORAL | Status: DC
  Administered 2017-02-01 – 2017-02-04 (×4): 40 mg via ORAL
  Filled 2017-02-01 (×4): qty 1

## 2017-02-01 MED ORDER — CIPROFLOXACIN IN D5W 400 MG/200ML IV SOLN
400.0000 mg | Freq: Once | INTRAVENOUS | Status: AC
Start: 2017-02-01 — End: 2017-02-01
  Administered 2017-02-01: 400 mg via INTRAVENOUS

## 2017-02-01 MED ORDER — DIPHENHYDRAMINE HCL 12.5 MG/5ML PO ELIX: 12.5000 mg | ORAL_SOLUTION | Freq: Four times a day (QID) | ORAL | Status: DC | PRN

## 2017-02-01 MED ORDER — ACETAMINOPHEN 500 MG PO TABS
1000.0000 mg | ORAL_TABLET | Freq: Four times a day (QID) | ORAL | Status: AC
  Administered 2017-02-01 – 2017-02-04 (×11): 1000 mg via ORAL
  Filled 2017-02-01 (×11): qty 2

## 2017-02-01 MED ORDER — SUCCINYLCHOLINE CHLORIDE 200 MG/10ML IV SOSY
PREFILLED_SYRINGE | INTRAVENOUS | Status: DC | PRN
  Administered 2017-02-01: 100 mg via INTRAVENOUS

## 2017-02-01 MED ORDER — HYDROMORPHONE HCL 1 MG/ML IJ SOLN
INTRAMUSCULAR | Status: AC
  Administered 2017-02-01: 1 mg
  Filled 2017-02-01: qty 1

## 2017-02-01 MED ORDER — PROPOFOL 10 MG/ML IV BOLUS
INTRAVENOUS | Status: DC | PRN
  Administered 2017-02-01: 100 mg via INTRAVENOUS

## 2017-02-01 MED ORDER — HYDROMORPHONE HCL 1 MG/ML IJ SOLN
0.5000 mg | INTRAMUSCULAR | Status: DC | PRN
  Administered 2017-02-02: 1 mg via INTRAVENOUS
  Filled 2017-02-01: qty 1

## 2017-02-01 MED ORDER — CIPROFLOXACIN IN D5W 400 MG/200ML IV SOLN
INTRAVENOUS | Status: AC
  Filled 2017-02-01: qty 200

## 2017-02-01 SURGICAL SUPPLY — 82 items
ADH SKN CLS APL DERMABOND .7 (GAUZE/BANDAGES/DRESSINGS) ×1
BAG LAPAROSCOPIC 12 15 PORT 16 (BASKET) ×1 IMPLANT
BAG RETRIEVAL 12/15 (BASKET) ×2
BAG URO CATCHER STRL LF (MISCELLANEOUS) ×2 IMPLANT
BASKET LASER NITINOL 1.9FR (BASKET) IMPLANT
BSKT STON RTRVL 120 1.9FR (BASKET)
CATH INTERMIT  6FR 70CM (CATHETERS) ×2 IMPLANT
CHLORAPREP W/TINT 26ML (MISCELLANEOUS) ×2 IMPLANT
CLIP LIGATING HEM O LOK PURPLE (MISCELLANEOUS) ×2 IMPLANT
CLIP LIGATING HEMO LOK XL GOLD (MISCELLANEOUS) ×1 IMPLANT
CLIP LIGATING HEMO O LOK GREEN (MISCELLANEOUS) ×4 IMPLANT
CLOTH BEACON ORANGE TIMEOUT ST (SAFETY) ×2 IMPLANT
COVER SURGICAL LIGHT HANDLE (MISCELLANEOUS) ×2 IMPLANT
COVER TIP SHEARS 8 DVNC (MISCELLANEOUS) ×1 IMPLANT
COVER TIP SHEARS 8MM DA VINCI (MISCELLANEOUS) ×1
DECANTER SPIKE VIAL GLASS SM (MISCELLANEOUS) ×2 IMPLANT
DERMABOND ADVANCED (GAUZE/BANDAGES/DRESSINGS) ×1
DERMABOND ADVANCED .7 DNX12 (GAUZE/BANDAGES/DRESSINGS) ×2 IMPLANT
DRAIN CHANNEL 15F RND FF 3/16 (WOUND CARE) ×2 IMPLANT
DRAPE ARM DVNC X/XI (DISPOSABLE) ×4 IMPLANT
DRAPE COLUMN DVNC XI (DISPOSABLE) ×1 IMPLANT
DRAPE DA VINCI XI ARM (DISPOSABLE) ×4
DRAPE DA VINCI XI COLUMN (DISPOSABLE) ×1
DRAPE INCISE IOBAN 66X45 STRL (DRAPES) ×2 IMPLANT
DRAPE SHEET LG 3/4 BI-LAMINATE (DRAPES) ×2 IMPLANT
ELECT PENCIL ROCKER SW 15FT (MISCELLANEOUS) ×2 IMPLANT
ELECT REM PT RETURN 9FT ADLT (ELECTROSURGICAL) ×2
ELECTRODE REM PT RTRN 9FT ADLT (ELECTROSURGICAL) ×1 IMPLANT
EVACUATOR SILICONE 100CC (DRAIN) ×2 IMPLANT
FIBER LASER FLEXIVA 1000 (UROLOGICAL SUPPLIES) IMPLANT
FIBER LASER FLEXIVA 365 (UROLOGICAL SUPPLIES) IMPLANT
FIBER LASER FLEXIVA 550 (UROLOGICAL SUPPLIES) IMPLANT
FIBER LASER TRAC TIP (UROLOGICAL SUPPLIES) IMPLANT
GLOVE BIO SURGEON STRL SZ 6.5 (GLOVE) ×2 IMPLANT
GLOVE BIOGEL M STRL SZ7.5 (GLOVE) ×4 IMPLANT
GOWN STRL REUS W/TWL LRG LVL3 (GOWN DISPOSABLE) ×6 IMPLANT
GUIDEWIRE ANG ZIPWIRE 038X150 (WIRE) ×2 IMPLANT
GUIDEWIRE STR DUAL SENSOR (WIRE) ×2 IMPLANT
IRRIG SUCT STRYKERFLOW 2 WTIP (MISCELLANEOUS) ×2
IRRIGATION SUCT STRKRFLW 2 WTP (MISCELLANEOUS) IMPLANT
IV NS 1000ML (IV SOLUTION) ×2
IV NS 1000ML BAXH (IV SOLUTION) ×1 IMPLANT
KIT BASIN OR (CUSTOM PROCEDURE TRAY) ×2 IMPLANT
MANIFOLD NEPTUNE II (INSTRUMENTS) ×2 IMPLANT
MARKER SKIN DUAL TIP RULER LAB (MISCELLANEOUS) ×2 IMPLANT
NDL INSUFFLATION 14GA 120MM (NEEDLE) ×1 IMPLANT
NEEDLE INSUFFLATION 14GA 120MM (NEEDLE) ×2 IMPLANT
NS IRRIG 1000ML POUR BTL (IV SOLUTION) ×2 IMPLANT
PACK CYSTO (CUSTOM PROCEDURE TRAY) ×2 IMPLANT
PORT ACCESS TROCAR AIRSEAL 12 (TROCAR) ×1 IMPLANT
PORT ACCESS TROCAR AIRSEAL 5M (TROCAR) ×1
POSITIONER SURGICAL ARM (MISCELLANEOUS) ×4 IMPLANT
RELOAD STAPLE 60 2.6 WHT THN (STAPLE) IMPLANT
RELOAD STAPLER WHITE 60MM (STAPLE) ×4 IMPLANT
SEAL CANN UNIV 5-8 DVNC XI (MISCELLANEOUS) ×4 IMPLANT
SEAL XI 5MM-8MM UNIVERSAL (MISCELLANEOUS) ×4
SET TRI-LUMEN FLTR TB AIRSEAL (TUBING) ×2 IMPLANT
SOLUTION ELECTROLUBE (MISCELLANEOUS) ×2 IMPLANT
SPONGE LAP 4X18 X RAY DECT (DISPOSABLE) ×1 IMPLANT
STAPLE ECHEON FLEX 60 POW ENDO (STAPLE) IMPLANT
STAPLER ECHELON LONG 60 440 (INSTRUMENTS) ×1 IMPLANT
STAPLER RELOAD WHITE 60MM (STAPLE) ×8
STENT URET 6FRX26 CONTOUR (STENTS) ×1 IMPLANT
SUT ETHILON 3 0 PS 1 (SUTURE) ×2 IMPLANT
SUT MNCRL AB 4-0 PS2 18 (SUTURE) ×4 IMPLANT
SUT PDS AB 1 CT1 27 (SUTURE) ×6 IMPLANT
SUT VIC AB 2-0 SH 27 (SUTURE) ×2
SUT VIC AB 2-0 SH 27X BRD (SUTURE) ×1 IMPLANT
SUT VLOC BARB 180 ABS3/0GR12 (SUTURE) ×2
SUTURE VLOC BRB 180 ABS3/0GR12 (SUTURE) ×1 IMPLANT
SYR CONTROL 10ML LL (SYRINGE) ×2 IMPLANT
TAPE STRIPS DRAPE STRL (GAUZE/BANDAGES/DRESSINGS) ×2 IMPLANT
TOWEL OR 17X26 10 PK STRL BLUE (TOWEL DISPOSABLE) ×2 IMPLANT
TOWEL OR NON WOVEN STRL DISP B (DISPOSABLE) ×2 IMPLANT
TRAY FOLEY W/METER SILVER 16FR (SET/KITS/TRAYS/PACK) ×2 IMPLANT
TRAY LAPAROSCOPIC (CUSTOM PROCEDURE TRAY) ×2 IMPLANT
TROCAR BLADELESS OPT 12M 100M (ENDOMECHANICALS) ×2 IMPLANT
TROCAR BLADELESS OPT 5 100 (ENDOMECHANICALS) IMPLANT
TROCAR XCEL 12X100 BLDLESS (ENDOMECHANICALS) ×2 IMPLANT
TUBE FEEDING 8FR 16IN STR KANG (MISCELLANEOUS) ×1 IMPLANT
TUBING CONNECTING 10 (TUBING) ×2 IMPLANT
WATER STERILE IRR 1500ML POUR (IV SOLUTION) ×1 IMPLANT

## 2017-02-01 NOTE — Anesthesia Postprocedure Evaluation (Addendum)
Anesthesia Post Note  Patient: JANAI BRANNIGAN  Procedure(s) Performed: Procedure(s) (LRB): XI ROBOT ASSITED LAPAROSCOPIC NEPHROURETERECTOMY (Right) CYSTOSCOPY WITH RIGHT  RETROGRADE PYELOGRAM  AND RIGHT STENT PLACEMENT (Right)  Patient location during evaluation: SICU Anesthesia Type: General Level of consciousness: awake and alert Pain management: pain level controlled Vital Signs Assessment: post-procedure vital signs reviewed and stable Respiratory status: spontaneous breathing Cardiovascular status: stable Anesthetic complications: no Comments: Placed on bipap early in pacu stay. Helped decrease work of breathing, achieving tidal volumes > 800cc.  Hypertensive given hydralazine prior to going to ICU       Last Vitals:  Vitals:   02/01/17 1321 02/01/17 1327  BP: (!) 181/110 (!) 187/111  Pulse: (!) 104 (!) 106  Resp: (!) 24 16  Temp:      Last Pain:  Vitals:   02/01/17 1251  TempSrc:   PainSc: 10-Worst pain ever                 Loveah Like S

## 2017-02-01 NOTE — Progress Notes (Signed)
Lasix 20 IVP given as ordered- Dr. Kalman Shan in to see patient

## 2017-02-01 NOTE — Transfer of Care (Signed)
Immediate Anesthesia Transfer of Care Note  Patient: Victor Wang  Procedure(s) Performed: Procedure(s): XI ROBOT ASSITED LAPAROSCOPIC NEPHROURETERECTOMY (Right) CYSTOSCOPY WITH RIGHT  RETROGRADE PYELOGRAM  AND RIGHT STENT PLACEMENT (Right)  Patient Location: PACU  Anesthesia Type:General  Level of Consciousness: awake, alert  and patient cooperative  Airway & Oxygen Therapy: Patient Spontanous Breathing and Patient connected to face mask oxygen  Post-op Assessment: Report given to RN and Post -op Vital signs reviewed and stable  Post vital signs: Reviewed and stable  Last Vitals:  Vitals:   02/01/17 0226 02/01/17 0441  BP: 123/73 132/74  Pulse: 86 90  Resp: 18 16  Temp: 36.7 C 36.7 C    Last Pain:  Vitals:   02/01/17 0441  TempSrc: Oral  PainSc:       Patients Stated Pain Goal: 0 (37/29/02 1115)  Complications: No apparent anesthesia complications

## 2017-02-01 NOTE — Progress Notes (Signed)
Apresoline 10 mg IVP given as ordered by Dr. Kalman Shan- patient then to go to ICU

## 2017-02-01 NOTE — Progress Notes (Signed)
Dr. Kalman Shan in to see patient- made aware of vital signs- condition of patient-orders given- O.K. To go to ICU

## 2017-02-01 NOTE — Progress Notes (Signed)
Hgb. - 10.4- Hct. - 32.5- results noted.

## 2017-02-01 NOTE — Discharge Instructions (Signed)

## 2017-02-01 NOTE — Progress Notes (Signed)
  Subjective:  1- High GradeBladder Cancer / Rt Renal Pelvis Cancer-Found initially 2015 on eval gross hematuria. Former smoker and cytoxan use. Most recently with 3-4 cm Rt renal pelvis enhancing mass worrisome for upper tract TCC on eva new gross hematuria. 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 Stent removed in office early 01/2017 as it had become distally displaced.    2 - Chronic Renal Insufficiency -Follows Jamal Maes for h/o MPGN s/p Cytoxan 2007. Most recent Cr 1-1.4's.    3-Chronic Blood Loss Anemia -Hgb 7-9's following ongoing blood loss from right kidney cancer. s  He is quite sensitive to anemia having worsening fatigue when Hgb <9. He has been on PO Fe.    Today "[name]" is stable. Received 2u RBC yeserday, post transfusion CBC pending. No interval gross hematuria.    Objective: Vital signs in last 24 hours: Temp:  [97.9 F (36.6 C)-98.3 F (36.8 C)] 98.1 F (36.7 C) (03/07 0441) Pulse Rate:  [56-95] 90 (03/07 0441) Resp:  [16-19] 16 (03/07 0441) BP: (110-143)/(61-79) 132/74 (03/07 0441) SpO2:  [94 %-100 %] 96 % (03/07 0441) Weight:  [142.7 kg (314 lb 11.2 oz)] 142.7 kg (314 lb 11.2 oz) (03/06 1411) Last BM Date: 01/30/17  Intake/Output from previous day: 03/06 0701 - 03/07 0700 In: 1117.5 [P.O.:240; Blood:877.5] Out: -  Intake/Output this shift: Total I/O In: 877.5 [Blood:877.5] Out: -   General appearance: alert and cooperative Eyes: negative Nose: Nares normal. Septum midline. Mucosa normal. No drainage or sinus tenderness. Throat: lips, mucosa, and tongue normal; teeth and gums normal Back: symmetric, no curvature. ROM normal. No CVA tenderness. Resp: non-labored on NCO2 Cardio: Nl rate GI: soft, non-tender; bowel sounds normal; no masses,  no organomegaly Male genitalia: normal Extremities: extremities normal, atraumatic, no cyanosis  or edema Pulses: 2+ and symmetric Skin: Skin color, texture, turgor normal. No rashes or lesions Neurologic: Grossly normal  Lab Results:   Recent Labs  01/31/17 1505  WBC 4.4  HGB 7.7*  HCT 23.7*  PLT 184   BMET  Recent Labs  01/31/17 1505  NA 133*  K 3.1*  CL 98*  CO2 28  GLUCOSE 132*  BUN 8  CREATININE 0.83  CALCIUM 8.6*   PT/INR No results for input(s): LABPROT, INR in the last 72 hours. ABG No results for input(s): PHART, HCO3 in the last 72 hours.  Invalid input(s): PCO2, PO2  Studies/Results: No results found.  Anti-infectives: Anti-infectives    None      Assessment/Plan:  Proceed as planned with RIGHT neph-U. Risks, benefits, alternatives, expected peri-op course discussed previously and reiterated today.   [facility], Lizandro Spellman 02/01/2017

## 2017-02-01 NOTE — Anesthesia Procedure Notes (Signed)
Procedure Name: Intubation Date/Time: 02/01/2017 8:52 AM Performed by: Dione Booze Pre-anesthesia Checklist: Patient identified, Emergency Drugs available, Suction available and Patient being monitored Patient Re-evaluated:Patient Re-evaluated prior to inductionOxygen Delivery Method: Circle system utilized Preoxygenation: Pre-oxygenation with 100% oxygen Intubation Type: IV induction Laryngoscope Size: Mac and 4 Grade View: Grade I Tube type: Subglottic suction tube Laser Tube: Cuffed inflated with minimal occlusive pressure - saline Tube size: 7.5 mm Number of attempts: 1 Airway Equipment and Method: Stylet Placement Confirmation: ETT inserted through vocal cords under direct vision,  positive ETCO2 and breath sounds checked- equal and bilateral Secured at: 21 cm Tube secured with: Tape Dental Injury: Teeth and Oropharynx as per pre-operative assessment

## 2017-02-01 NOTE — Anesthesia Preprocedure Evaluation (Addendum)
Anesthesia Evaluation  Patient identified by MRN, date of birth, ID band Patient awake    Reviewed: Allergy & Precautions, NPO status , Patient's Chart, lab work & pertinent test results  Airway Mallampati: II  TM Distance: >3 FB Neck ROM: Full    Dental no notable dental hx.    Pulmonary shortness of breath and with exertion, sleep apnea , COPD,  COPD inhaler, former smoker,    Pulmonary exam normal breath sounds clear to auscultation       Cardiovascular hypertension, + Peripheral Vascular Disease  Normal cardiovascular exam Rhythm:Regular Rate:Normal     Neuro/Psych negative neurological ROS  negative psych ROS   GI/Hepatic negative GI ROS, Neg liver ROS,   Endo/Other  Morbid obesity  Renal/GU negative Renal ROS  negative genitourinary   Musculoskeletal negative musculoskeletal ROS (+)   Abdominal   Peds negative pediatric ROS (+)  Hematology  (+) anemia ,   Anesthesia Other Findings   Reproductive/Obstetrics negative OB ROS                            Anesthesia Physical Anesthesia Plan  ASA: IV  Anesthesia Plan: General   Post-op Pain Management:    Induction: Intravenous  Airway Management Planned: Oral ETT  Additional Equipment:   Intra-op Plan:   Post-operative Plan: Possible Post-op intubation/ventilation  Informed Consent: I have reviewed the patients History and Physical, chart, labs and discussed the procedure including the risks, benefits and alternatives for the proposed anesthesia with the patient or authorized representative who has indicated his/her understanding and acceptance.   Dental advisory given  Plan Discussed with: CRNA and Surgeon  Anesthesia Plan Comments:         Anesthesia Quick Evaluation

## 2017-02-01 NOTE — Progress Notes (Signed)
RT took pt off BIPAP. Pt in no distress at this time. Pt awake and alert talking to family.

## 2017-02-01 NOTE — Brief Op Note (Signed)
01/31/2017 - 02/01/2017  12:05 PM  PATIENT:  Victor Wang  82 y.o. male  PRE-OPERATIVE DIAGNOSIS:  RIGHT RENAL PELVIS CANCER WITH CONTINUED BLEEDING  POST-OPERATIVE DIAGNOSIS:  RIGHT RENAL PELVIS CANCER WITH CONTINUED BLEEDING  PROCEDURE:  Procedure(s): XI ROBOT ASSITED LAPAROSCOPIC NEPHROURETERECTOMY (Right) CYSTOSCOPY WITH RIGHT  RETROGRADE PYELOGRAM  AND RIGHT STENT PLACEMENT (Right)  SURGEON:  Surgeon(s) and Role:    * Alexis Frock, MD - Primary  PHYSICIAN ASSISTANT:   ASSISTANTS: Debbrah Alar PA   ANESTHESIA:   local and general  EBL:  Total I/O In: 2119 [I.V.:800; Blood:335] Out: 78 [Urine:350; Blood:100]  BLOOD ADMINISTERED:none  DRAINS: 1 - Jp to bulb, 2 - Foley to gravity   LOCAL MEDICATIONS USED:  MARCAINE     SPECIMEN:  Source of Specimen:  right kidney + ureter + hilar lymph nodes  DISPOSITION OF SPECIMEN:  PATHOLOGY  COUNTS:  YES  TOURNIQUET:  * No tourniquets in log *  DICTATION: .Other Dictation: Dictation Number  [id number]  PLAN OF CARE: Admit to inpatient   PATIENT DISPOSITION:  PACU - hemodynamically stable.   Delay start of Pharmacological VTE agent (>24hrs) due to surgical blood loss or risk of bleeding: yes

## 2017-02-01 NOTE — Progress Notes (Signed)
Hgb. And Hct. Drawn by lab. 

## 2017-02-02 ENCOUNTER — Encounter (HOSPITAL_COMMUNITY): Payer: Self-pay | Admitting: Urology

## 2017-02-02 LAB — TYPE AND SCREEN
ABO/RH(D): A POS
Antibody Screen: NEGATIVE
Unit division: 0
Unit division: 0
Unit division: 0

## 2017-02-02 LAB — BASIC METABOLIC PANEL
ANION GAP: 7 (ref 5–15)
BUN: 11 mg/dL (ref 6–20)
CHLORIDE: 102 mmol/L (ref 101–111)
CO2: 27 mmol/L (ref 22–32)
CREATININE: 1.08 mg/dL (ref 0.61–1.24)
Calcium: 9.1 mg/dL (ref 8.9–10.3)
GFR calc non Af Amer: >60 mL/min (ref >60)
Glucose, Bld: 132 mg/dL — ABNORMAL HIGH (ref 65–99)
POTASSIUM: 4.3 mmol/L (ref 3.5–5.1)
SODIUM: 136 mmol/L (ref 135–145)

## 2017-02-02 LAB — BPAM RBC
BLOOD PRODUCT EXPIRATION DATE: 201803282359
BLOOD PRODUCT EXPIRATION DATE: 201803282359
Blood Product Expiration Date: 201803282359
ISSUE DATE / TIME: 201803062238
ISSUE DATE / TIME: 201803070827
ISSUE DATE / TIME: 201803070153
UNIT TYPE AND RH: 6200
Unit Type and Rh: 6200
Unit Type and Rh: 6200

## 2017-02-02 LAB — CREATININE, FLUID (PLEURAL, PERITONEAL, JP DRAINAGE): CREAT FL: 1.2 mg/dL

## 2017-02-02 LAB — HEMOGLOBIN AND HEMATOCRIT, BLOOD
HEMATOCRIT: 29.2 % — AB (ref 39.0–52.0)
HEMOGLOBIN: 9.6 g/dL — AB (ref 13.0–17.0)

## 2017-02-02 MED ORDER — POLYETHYLENE GLYCOL 3350 17 G PO PACK
17.0000 g | PACK | Freq: Every day | ORAL | Status: DC | PRN
  Administered 2017-02-03: 17 g via ORAL
  Filled 2017-02-02: qty 1

## 2017-02-02 MED ORDER — BISACODYL 10 MG RE SUPP: 10.0000 mg | Freq: Every day | RECTAL | Status: DC | PRN

## 2017-02-02 MED ORDER — BOOST / RESOURCE BREEZE PO LIQD
1.0000 | ORAL | Status: DC
  Administered 2017-02-03: 1 via ORAL

## 2017-02-02 MED ORDER — ENSURE ENLIVE PO LIQD
237.0000 mL | ORAL | Status: DC
  Administered 2017-02-03 – 2017-02-04 (×2): 237 mL via ORAL

## 2017-02-02 NOTE — Progress Notes (Signed)
Nutrition Brief Note  Patient identified on the Malnutrition Screening Tool (MST) Report  Wt Readings from Last 15 Encounters:  02/01/17 (!) 308 lb 6.8 oz (139.9 kg)  01/23/17 (!) 321 lb 1.6 oz (145.7 kg)  01/04/17 (!) 327 lb 2.6 oz (148.4 kg)  12/28/16 (!) 318 lb (144.2 kg)  12/12/16 (!) 327 lb (148.3 kg)  11/15/16 (!) 341 lb 9 oz (154.9 kg)  09/10/15 (!) 356 lb (161.5 kg)  10/29/14 (!) 340 lb (154.2 kg)  05/26/14 (!) 324 lb (147 kg)  04/09/14 (!) 323 lb (146.5 kg)    Body mass index is 40.69 kg/m. Patient meets criteria for morbid obesity based on current BMI. Abdominal incision from 02/01/17 d/t R nephroureterectomy secondary to high grade bladder cancer/R renal pelvis cancer. Weight hx outlined above. Pt reports that in December he began a low Na diet and also began monitoring portion sizes. He states that he has 10 flavors of Mrs. Dash at home and that he and wife spend more time looking at nutrition labels than actually grocery shopping. He states that he uses olive oil and coconut oil often in cooking. Talked with pt about monitoring saturated fats and limiting these fats when possible as he is interested in "slimming down." Encouraged to use olive oil and canola oil in cooking.   Pt reports that he broke his back 3 years ago and that he also has a hx of arthritis. Since that time he has been sleeping on a recliner rather than in bed and has been minimally mobile. He has home health PT; encouraged him to continue working with them the best as possible and asking about exercise he can do on his own when feasible.   Current diet order is Heart Healthy, patient is consuming approximately 75-100% of meals at this time.   Medications reviewed; 40 mg oral Lasix BID, 40 mg oral Protonix/day.  Labs reviewed.  Ensure Enlive order BID and Boost Breeze ordered TID. Will decrease each to once/day. The Ensure provides 350 kcal and 20 grams of protein and the Boost Breeze provides 250 kcal and 9  grams of protein. No further nutrition interventions warranted at this time. If nutrition issues arise, please consult RD.     Jarome Matin, MS, RD, LDN, [facility] Inpatient Clinical Dietitian Pager # [phone] After hours/weekend pager # [phone]

## 2017-02-02 NOTE — Progress Notes (Signed)
Pt has declined the use of CPAP tonight.  Pt to notify RT if he changes his mind.  Machine ready at Pt's bedside, RT to monitor and assess as needed.

## 2017-02-02 NOTE — Care Management Note (Signed)
Case Management Note  Patient Details  Name: Victor Wang MRN: [id number] Date of Birth: Feb 18, 1935  Subjective/Objective:        Hx of bladder and renal ca-resp failure requiring bipapat 50%Fio2.            Action/Plan: Date:  February 02, 2017 Chart reviewed for concurrent status and case management needs. Will continue to follow patient progress. Discharge Planning: following for needs Expected discharge date: [id number] Velva Harman, BSN, [place], [place]  Expected Discharge Date:                  Expected Discharge Plan:  Home/Self Care  In-House Referral:     Discharge planning Services     Post Acute Care Choice:    Choice offered to:     DME Arranged:    DME Agency:     HH Arranged:    [place] Agency:     Status of Service:  In process, will continue to follow  If discussed at Long Length of Stay Meetings, dates discussed:    Additional Comments:  Leeroy Cha, RN 02/02/2017, 8:54 AM

## 2017-02-02 NOTE — Evaluation (Signed)
Physical Therapy Evaluation Patient Details Name: Victor Wang MRN: [id number] DOB: 1935/03/27 Today's Date: 02/02/2017   History of Present Illness  CYSTOSCOPY WITH RIGHT  RETROGRADE PYELOGRAM  AND RIGHT STENT PLACEMENT for right renal pelvis mass with continued bleeding.  Clinical Impression  The patient is very eager to ambulate x 120' with 3 liters of O2. Pt admitted with above diagnosis. Pt currently with functional limitations due to the deficits listed below (see PT Problem List).  Pt will benefit from skilled PT to increase their independence and safety with mobility to allow discharge to the venue listed below.       Follow Up Recommendations Home health PT;Supervision/Assistance - 24 hour    Equipment Recommendations  None recommended by PT    Recommendations for Other Services       Precautions / Restrictions Precautions Precaution Comments: right JP draIN. ON OXYGEN      Mobility  Bed Mobility               General bed mobility comments: in recliner  Transfers Overall transfer level: Needs assistance Equipment used: Rolling walker (2 wheeled) Transfers: Sit to/from Stand Sit to Stand: Min guard         General transfer comment: cues for safety, impulsive, cues to slow down  Ambulation/Gait Ambulation/Gait assistance: Min assist;+2 safety/equipment Ambulation Distance (Feet): 120 Feet Assistive device: Rolling walker (2 wheeled) Gait Pattern/deviations: Step-through pattern     General Gait Details: gait is  steady, oxygen saturation drops to 85% on 3 liters.  returned to 92 5 with rst and pursed lip breathing.  Stairs            Wheelchair Mobility    Modified Rankin (Stroke Patients Only)       Balance                                             Pertinent Vitals/Pain Pain Assessment: 0-10 Pain Score: 4  Pain Location: abdomen Pain Descriptors / Indicators: Discomfort Pain Intervention(s): Monitored  during session;Patient requesting pain meds-RN notified    Home Living Family/patient expects to be discharged to:: Private residence Living Arrangements: Spouse/significant other Available Help at Discharge: Family Type of Home: House Home Access: Stairs to enter   Technical brewer of Steps: 2 and 1 Home Layout: One level Home Equipment: Walker - 2 wheels Additional Comments: pt reports no difficulty with a couple steps to enter home    Prior Function Level of Independence: Independent with assistive device(s)         Comments: typically uses RW for community, reports sometimes using RW at home but more frequently holds onto furniture/walls     Hand Dominance        Extremity/Trunk Assessment   Upper Extremity Assessment Upper Extremity Assessment: Overall WFL for tasks assessed    Lower Extremity Assessment Lower Extremity Assessment: Generalized weakness    Cervical / Trunk Assessment Cervical / Trunk Assessment: Normal  Communication   Communication: No difficulties  Cognition Arousal/Alertness: Awake/alert Behavior During Therapy: WFL for tasks assessed/performed;Impulsive Overall Cognitive Status: Within Functional Limits for tasks assessed                      General Comments      Exercises     Assessment/Plan    PT Assessment Patient needs continued PT services  PT  Problem List Decreased activity tolerance;Decreased mobility;Cardiopulmonary status limiting activity;Pain       PT Treatment Interventions DME instruction;Gait training;Functional mobility training;Therapeutic activities;Therapeutic exercise;Patient/family education;Stair training    PT Goals (Current goals can be found in the Care Plan section)  Acute Rehab PT Goals Patient Stated Goal: to walk PT Goal Formulation: With patient/family Time For Goal Achievement: 02/16/17 Potential to Achieve Goals: Good    Frequency Min 3X/week   Barriers to discharge         Co-evaluation               End of Session Equipment Utilized During Treatment: Oxygen Activity Tolerance: Patient tolerated treatment well Patient left: in chair;with call bell/phone within reach Nurse Communication: Mobility status PT Visit Diagnosis: Difficulty in walking, not elsewhere classified (R26.2)         Time: 7473-4037 PT Time Calculation (min) (ACUTE ONLY): 20 min   Charges:   PT Evaluation $PT Eval Moderate Complexity: 1 Procedure     PT G CodesClaretha Cooper 02/02/2017, 4:59 PM Tresa Endo PT [phone]

## 2017-02-02 NOTE — Progress Notes (Signed)
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 Path pending. Admitted 3/6 for pre-op transfusion 3 units.   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 Most recent Cr 1-1.4's. Now s/p RIGHT nephrectomy 01/2017.   3-Chronic Blood Loss Anemia -Hgb 7-9's following ongoing blood loss from right kidney cancer. s  He is quite sensitive to anemia having worsening fatigue when Hgb <9. Transfused 3upRBC on admission for Hgb 7s.  4 - Disposition / Rehab - pt independent at [place], on O2 (day), CPAP at night but lives independently. PT eval pending.   Today "Victor Wang" is stable. Pain managable. Hgb 9's, Cr 1's. Excellent UOP.   Objective: Vital signs in last 24 hours: Temp:  [97.3 F (36.3 C)-98.2 F (36.8 C)] 97.7 F (36.5 C) (03/08 0309) Pulse Rate:  [81-120] 81 (03/08 0400) Resp:  [13-33] 13 (03/08 0400) BP: (117-194)/(54-117) 140/67 (03/08 0400) SpO2:  [89 %-99 %] 92 % (03/08 0400) FiO2 (%):  [50 %] 50 % (03/07 1409) Weight:  [139.9 kg (308 lb 6.8 oz)] 139.9 kg (308 lb 6.8 oz) (03/07 1409) Last BM Date: 01/30/17  Intake/Output from previous day: 03/07 0701 - 03/08 0700 In: 1945.8 [P.O.:120; I.V.:1490.8; Blood:335] Out: 9485 [Urine:1230; Drains:505; Blood:100] Intake/Output this shift: No intake/output data recorded.  General appearance: alert, cooperative, appears stated age and metnal status at baseline.  Eyes: negative Nose: Nares normal. Septum midline. Mucosa normal. No drainage or sinus tenderness. Throat: lips, mucosa, and tongue normal; teeth and gums normal Neck: supple, symmetrical, trachea midline Back: symmetric, no curvature. ROM normal. No CVA tenderness. Resp: some shallow breathign but non-labored, similar to baseline.  Cardio: regualr mild tachycardia wtih HR low  100s by bedsie monitor.  GI: soft, non-tender; bowel sounds normal; no masses,  no organomegaly Male genitalia: normal, foley c/d/i with light pink urine.  Extremities: extremities normal, atraumatic, no cyanosis or edema Pulses: 2+ and symmetric Skin: Skin color, texture, turgor normal. No rashes or lesions Neurologic: Grossly normal Incision/Wound: Recent incision sites c/d/i. JP with serosanguinous fluid as expected.   Lab Results:   Recent Labs  01/31/17 1505 02/01/17 0645 02/01/17 1248 02/02/17 0314  WBC 4.4 4.0  --   --   HGB 7.7* 8.7* 10.4* 9.6*  HCT 23.7* 27.3* 32.5* 29.2*  PLT 184 172  --   --    BMET  Recent Labs  01/31/17 1505 02/02/17 0314  NA 133* 136  K 3.1* 4.3  CL 98* 102  CO2 28 27  GLUCOSE 132* 132*  BUN 8 11  CREATININE 0.83 1.08  CALCIUM 8.6* 9.1   PT/INR No results for input(s): LABPROT, INR in the last 72 hours. ABG No results for input(s): PHART, HCO3 in the last 72 hours.  Invalid input(s): PCO2, PO2  Studies/Results: Dg Retrograde Pyelogram  Result Date: 02/01/2017 CLINICAL DATA:  82 year old male with a history of right-sided ureteral stent placement. EXAM: RETROGRADE PYELOGRAM COMPARISON:  CT 01/20/2017, 01/01/2017, prior retrograde 09/10/2015 FINDINGS: Limited intraoperative fluoroscopic spot images during retrograde pyelogram. Initial image demonstrates ureteral scope projecting over the pelvis with retrograde contrast within the distal right ureter. Incomplete opacification of the right collecting system and right ureter. Final image demonstrates the distal aspect of a right ureteral stent with the distal loop formed in the region of the urinary bladder. IMPRESSION: Limited images during right-sided retrograde  pyelogram demonstrate placement of a right ureteral stent, partially imaged. Please refer to the dictated operative report for full details of intraoperative findings and procedure. Signed, Dulcy Fanny. Earleen Newport, DO [facility] Radiology Electronically Signed   By: Corrie Mckusick D.O.   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 Path penidng. No further cancer-directed therapy this admission.   2 - Chronic Renal Insufficiency / Solitary LEFT kidney -GFR remains good.   3-Chronic Blood Loss Anemia -this will hopefully improve not that source of chronic bleeding removed.   4 - Disposition / Rehab - PT eval today to help with DC planning, very well may need rehab or HHPT. Remain stepdowrn for now for pulm considerations.   Continue aggressive pulm toilet, SLIV.    [facility], Graylee Arutyunyan 02/02/2017

## 2017-02-02 NOTE — Op Note (Signed)
NAMEQUINN, QUAM NO.:  [id number]  MEDICAL RECORD NO.:  [id number]  LOCATION:                                 FACILITY:  PHYSICIAN:  Alexis Frock, MD          DATE OF BIRTH:  DATE OF PROCEDURE: 02/01/2017                              OPERATIVE REPORT   PREOPERATIVE DIAGNOSES:  Right upper tract renal pelvis mass, refractory bleeding with anemia.  PROCEDURE: 1. Cystoscopy with right retrograde pyelogram interpretation. 2. Insertion of right ureteral stent. 3. Right robotic nephroureterectomy.  ESTIMATED BLOOD LOSS:  Less than 50 mL.  COMPLICATION:  None.  SPECIMEN:  Right nephroureterectomy with hilar lymph nodes for pathology.  DRAINS: 1. Jackson-Pratt drain to bulb suction. 2. Foley catheter straight drain.  ASSISTANT:  Debbrah Alar, PA.  FINDINGS: 1. Large endophytic right upper tract renal mass as anticipated. 2. One early branching artery. 3. Two vein right renovascular anatomy.  INDICATION:  Victor Wang is a quite comorbid morbidly obese 82 year old male with history of COPD on oxygen, who has known bladder cancer. He was found on workup of restaging bladder cancer to have a large infiltrative right renal pelvis mass.  He has been bleeding from this with symptomatic anemia with repeat hospitalizations for transfusion. He has clinically localized disease.  Options were discussed including palliative only protocols versus surgical extirpation with both therapeutic and palliative intent and he adamantly wished to proceed understanding that he is not the optimal surgical candidate.  He was admitted to the hospital preoperatively yesterday, transfused 2 units of blood.  Hemoglobin was felt to be acceptable and he desired to proceed.  PROCEDURE IN DETAIL:  The patient being, Victor Wang, verified. Procedure being right cysto, stent, nephroureterectomy was confirmed. The patient was placed into a low lithotomy position.  Sterile  field was created by prepping and draping the patient's penis, perineum, proximal thighs using iodine.  Cystourethroscopy was performed using rigid cystoscope.  Inspection of bladder was unremarkable.  The right ureteral orifice was cannulated with a 6-French end-hole catheter and right retrograde pyelogram obtained.  Right retrograde pyelogram demonstrated a single right ureter with single-system right kidney.  There was a large filling defect in the mid pole consistent with known endophytic mass.  A 0.038 Sensor wire was advanced at the lower pole over which a new 6 x 26 Contour-type stent was placed using cystoscopic and fluoroscopic guidance.  A new Foley catheter was placed per urethra to straight drain.  The patient was then repositioned into a right side up full flank position, applying 15 degrees of stable flexion, superior arm elevator, axillary roll, sequential compression devices, bottom leg bent, top leg straight. Beanbag was also deployed.  He was further fashioned on the operative table using 3-inch tape over foam padding across the supraxiphoid chest and his pelvis.  Sterile field was created by prepping and draping the patient's entire abdomen and right flank using chlorhexidine gluconate and a high-flow, low-pressure pneumoperitoneum was obtained using Veress technique in the right lower quadrant having passed the aspiration and drop test.  An 8-mm robotic camera port was then placed in position approximately 2 handbreadth superior lateral to the umbilicus.  This revealed no significant adhesions and no visceral injury.  The liver had a very nodular appearance consistent with chronic disease and fatty infiltration.  Additional ports were then placed as follows; right subcostal 8-mm robotic port, right far lateral 8-mm robotic port, right inferior paramedian 8-mm robotic port, 4 fingerbreadths superior to the pubic ramus, a 5 mm subxiphoid port through, which a  self-locking grasper was used to place the liver on gentle superior traction and two 12-mm assistant ports in the midline; one, 2 fingerbreadths above the camera port; one, 3 fingerbreadths below.  The robot was docked and passed through the electronic checks.  Initial attention was directed at development of retroperitoneum.  Incision was made lateral to the ascending colon from the area of the cecum to the area of the hepatic flexure.  It was carefully mobilized medially.  The lower pole of the kidney was identified, placed on gentle lateral traction.  Dissection proceeded medial to this.  The ureter and gonadal vein were encountered. Placed on gentle lateral traction.  The psoas muscle was identified. Dissection proceeded within this triangle towards the area of the renal hilum.  The gonadal vessels were purposely taken using vascular stapler approaching the hilum.  Hilum was somewhat complex as anticipated because there was single very early branching renal artery and 2 renal veins.  The smaller more anterior superior vein was taken using vascular stapler, which resulted in better visualization of the artery, which was circumferentially mobilized before its branching, controlled with an extra large Hem-o-lok clip proximally and vascular stapler distally. The final more dominant lower inferior vein was taken using vascular stapler.  Dissection then proceeded in a non-adrenal sparing fashion just along the edge of the vena cava towards the area of the inferior hepatic margin.  Anterior attachments were taken down using cautery scissors as were lateral attachments.  Dissection was then proceeded. At the pelvic dissection, ureter was circumferentially mobilized to the lower pole of the kidney towards the area of the iliac vessels.  A posterior peritoneal flap was developed by incising just lateral to the medial umbilical ligament and used D4 of the dissection.  The ureter was then  circumferentially mobilized beyond the iliac vessels at the ureterovesical junction.  It was then clipped with an extra large clip proximally.  It was cut through half of its circumference to deliver the distal end of the stent.  A stay suture of 3-0 V-Loc was applied.  The rest of the ureter was then cut at the area of the ureterovesical junction and the bladder cuff oversewn using the previous 3-0 V-Loc stay sutures.  This resulted in excellent control of the ureteral stump bladder junction.  At this point, hemostasis appeared excellent.  All sponge and needle counts were correct.  The specimen was placed in extra large EndoCatch bag.  Robot was then undocked.  Specimen was retrieved by extending the previous inferior most assistant port site superiorly for total distance of 8 cm removing the specimen and setting it aside for permanent pathology.  Extraction site was closed at the level of the fascia using figure-of-eight PDS x6.  Closed suction drain was brought through the previous right lateral most robotic port site to the peritoneal cavity.  All incision sites were infiltrated with dilute lyophilized Marcaine and closed at the level of the skin using subcuticular Monocryl followed by Dermabond.  Procedure then terminated. The patient tolerated the procedure well.  There were no immediate periprocedural complications.  The patient was taken to  the Postanesthesia Care Unit in stable condition.  Please note surgical assistant, Debbrah Alar, was absolutely crucial for all aspects of the procedure to provide an invaluable intraoperative bedside assistance, suctioning, retraction, vascular stapling, and then clipping without which the procedure today would not be possible.    ______________________________ Alexis Frock, MD   ______________________________ Alexis Frock, MD    TM/MEDQ  D:  02/01/2017  T:  02/02/2017  Job:  [id number]

## 2017-02-03 LAB — HEMOGLOBIN AND HEMATOCRIT, BLOOD
HEMATOCRIT: 31.3 % — AB (ref 39.0–52.0)
HEMOGLOBIN: 10.2 g/dL — AB (ref 13.0–17.0)

## 2017-02-03 LAB — BASIC METABOLIC PANEL
ANION GAP: 7 (ref 5–15)
BUN: 20 mg/dL (ref 6–20)
CALCIUM: 8.9 mg/dL (ref 8.9–10.3)
CO2: 28 mmol/L (ref 22–32)
Chloride: 99 mmol/L — ABNORMAL LOW (ref 101–111)
Creatinine, Ser: 1.17 mg/dL (ref 0.61–1.24)
GFR, EST NON AFRICAN AMERICAN: 57 mL/min — AB (ref >60)
Glucose, Bld: 106 mg/dL — ABNORMAL HIGH (ref 65–99)
POTASSIUM: 3.9 mmol/L (ref 3.5–5.1)
Sodium: 134 mmol/L — ABNORMAL LOW (ref 135–145)

## 2017-02-03 NOTE — NC FL2 (Signed)
[place] LEVEL OF CARE SCREENING TOOL     IDENTIFICATION  Patient Name: Victor Wang Birthdate: [birth year] Sex: male Admission Date (Current Location): 01/31/2017  [facility] and [place] Number:  Herbalist and Address:  [facility],  [place], [place]      Provider Number: [id number]  Attending Physician Name and Address:  Alexis Frock, MD  Relative Name and Phone Number:       Current Level of Care: Hospital Recommended Level of Care: [place] Prior Approval Number:    Date Approved/Denied:   PASRR Number: [id number]  Discharge Plan: SNF    Current Diagnoses: Patient Active Problem List   Diagnosis Date Noted  . H/O nephroureterectomy 02/01/2017  . Cancer of renal pelvis, right ([place]) 01/31/2017  . CHF (congestive heart failure) ([place]) 01/01/2017  . Acute blood loss anemia 01/01/2017  . Abdominal pain 01/01/2017  . Hypotension 01/01/2017  . OSA (obstructive sleep apnea) 01/01/2017  . Essential hypertension 01/01/2017  . Bladder cancer ([place]) 09/10/2015    Orientation RESPIRATION BLADDER Height & Weight     Self, Time, Situation, Place  Normal Continent Weight: (!) 302 lb 11.1 oz (137.3 kg) Height:  '6\' 1"'$  (185.4 cm)  BEHAVIORAL SYMPTOMS/MOOD NEUROLOGICAL BOWEL NUTRITION STATUS      Continent Diet (Heart)  AMBULATORY STATUS COMMUNICATION OF NEEDS Skin   Extensive Assist Verbally Normal                       Personal Care Assistance Level of Assistance  Bathing, Dressing Bathing Assistance: Limited assistance   Dressing Assistance: Limited assistance     Functional Limitations Info             SPECIAL CARE FACTORS FREQUENCY  PT (By licensed PT), OT (By licensed OT)     PT Frequency: 5 OT Frequency: 5            Contractures      Additional Factors Info  Code Status, Allergies Code Status Info: Fullcode Allergies Info: Bee Venom, Penicillins, Tramadol           Current Medications (02/03/2017):  This is the current hospital active medication list Current Facility-Administered Medications  Medication Dose Route Frequency Provider Last Rate Last Dose  . acetaminophen (TYLENOL) tablet 1,000 mg  1,000 mg Oral Q6H Alexis Frock, MD   1,000 mg at 02/03/17 0825  . acetaminophen (TYLENOL) tablet 650 mg  650 mg Oral Q6H PRN Alexis Frock, MD   650 mg at 01/31/17 2146  . albuterol (PROVENTIL) (2.5 MG/3ML) 0.083% nebulizer solution 2.5 mg  2.5 mg Nebulization Q6H PRN Alexis Frock, MD      . allopurinol (ZYLOPRIM) tablet 100 mg  100 mg Oral Daily Alexis Frock, MD   100 mg at 02/03/17 1005  . bisacodyl (DULCOLAX) suppository 10 mg  10 mg Rectal Daily PRN Alexis Frock, MD      . diphenhydrAMINE (BENADRYL) injection 12.5 mg  12.5 mg Intravenous Q6H PRN Debbrah Alar, PA-C       Or  . diphenhydrAMINE (BENADRYL) 12.5 MG/5ML elixir 12.5 mg  12.5 mg Oral Q6H PRN Debbrah Alar, PA-C      . feeding supplement (BOOST / RESOURCE BREEZE) liquid 1 Container  1 Container Oral Q24H Alexis Frock, MD      . feeding supplement (ENSURE ENLIVE) (ENSURE ENLIVE) liquid 237 mL  237 mL Oral Q24H Alexis Frock, MD   237 mL at 02/03/17 1005  .  furosemide (LASIX) tablet 40 mg  40 mg Oral BID Alexis Frock, MD   40 mg at 02/03/17 0735  . HYDROmorphone (DILAUDID) injection 0.5-1 mg  0.5-1 mg Intravenous Q2H PRN Debbrah Alar, PA-C   1 mg at 02/02/17 1215  . ipratropium-albuterol (DUONEB) 0.5-2.5 (3) MG/3ML nebulizer solution 3 mL  3 mL Nebulization Q6H PRN Alexis Frock, MD      . metoprolol tartrate (LOPRESSOR) tablet 50 mg  50 mg Oral Daily Alexis Frock, MD   50 mg at 02/03/17 1005  . ondansetron (ZOFRAN) injection 4 mg  4 mg Intravenous Q4H PRN Debbrah Alar, PA-C      . oxyCODONE (Oxy IR/ROXICODONE) immediate release tablet 5 mg  5 mg Oral Q4H PRN Debbrah Alar, PA-C   5 mg at 02/03/17 0547  . pantoprazole (PROTONIX) EC tablet 40 mg  40 mg Oral Daily Alexis Frock, MD   40  mg at 02/03/17 1005  . polyethylene glycol (MIRALAX / GLYCOLAX) packet 17 g  17 g Oral Daily PRN Festus Aloe, MD      . pravastatin (PRAVACHOL) tablet 80 mg  80 mg Oral Daily Alexis Frock, MD   80 mg at 02/03/17 1005  . sodium chloride (OCEAN) 0.65 % nasal spray 1 spray  1 spray Each Nare PRN Alexis Frock, MD         Discharge Medications: Please see discharge summary for a list of discharge medications.  Relevant Imaging Results:  Relevant Lab Results:   Additional Information SSN: [id number]  Standley Brooking, LCSW

## 2017-02-03 NOTE — Progress Notes (Signed)
Received from ICU to room 1401 A7O x4 voices no c/o

## 2017-02-03 NOTE — Progress Notes (Signed)
Physical Therapy Treatment Patient Details Name: ORBIN MAYEUX MRN: [id number] DOB: 1935/06/22 Today's Date: 02/03/2017    History of Present Illness CYSTOSCOPY WITH RIGHT  RETROGRADE PYELOGRAM  AND RIGHT STENT PLACEMENT for right renal pelvis mass with continued bleeding.    PT Comments    The patient's oxygen saturation is dropping with ambulation and pain escalates. The patient's family member expresses concern for patient to return home as he is alone as wife works 12 hour shifts. Recommend short term Rehab. Will work with patient again this PM and discuss recommendation for rehab.   Follow Up Recommendations  SNF;Home health PT;Supervision/Assistance - 24 hour (patient is alone  days.)     Equipment Recommendations  None recommended by PT    Recommendations for Other Services       Precautions / Restrictions Precautions Precaution Comments: right JP drain. on OXYGEN. monitor  VS    Mobility  Bed Mobility               General bed mobility comments: in recliner  Transfers Overall transfer level: Needs assistance Equipment used: Rolling walker (2 wheeled) Transfers: Sit to/from Stand, extra effort for patient from low seat. Pt. Has a lift chair at home. Sit to Stand: Min guard         General transfer comment: cues for safety,   Ambulation/Gait Ambulation/Gait assistance: Min assist;+2 safety/equipment Ambulation Distance (Feet): 80 Feet Assistive device: Rolling walker (2 wheeled) Gait Pattern/deviations: Step-through pattern     General Gait Details: gait is  steady, oxygen saturation drops to 77% on 4 L. tends to not take deep breaths related to abdominal pain,encouraged pursed lip breaths. Returned to 92% with reast. HR 113   RN notified.   Stairs            Wheelchair Mobility    Modified Rankin (Stroke Patients Only)       Balance Overall balance assessment: Needs assistance Sitting-balance support: Feet supported;No upper  extremity supported Sitting balance-Leahy Scale: Good     Standing balance support: Bilateral upper extremity supported;During functional activity Standing balance-Leahy Scale: Fair                      Cognition Arousal/Alertness: Awake/alert                          Exercises      General Comments        Pertinent Vitals/Pain Pain Score: 9  Pain Location: abdomen Pain Descriptors / Indicators: Discomfort;Grimacing;Guarding Pain Intervention(s): Premedicated before session;Repositioned;Limited activity within patient's tolerance;Monitored during session    Home Living                      Prior Function            PT Goals (current goals can now be found in the care plan section) Progress towards PT goals: Progressing toward goals    Frequency           PT Plan Discharge plan needs to be updated    Co-evaluation             End of Session Equipment Utilized During Treatment: Oxygen Activity Tolerance: Treatment limited secondary to medical complications (Comment) Patient left: in chair;with call bell/phone within reach;with family/visitor present Nurse Communication: Mobility status and oxygen saturation.       Time: 1028-1050 PT Time Calculation (min) (ACUTE ONLY): 22 min  Charges:  $  Gait Training: 8-22 mins                    G Codes:       [name] 02/03/2017, 11:14 AM Tresa Endo PT [phone]

## 2017-02-03 NOTE — Progress Notes (Signed)
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 Path pending. Admitted 3/6 for pre-op transfusion 3 units. JP Cr 3/8 same as serum.   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 Most recent Cr 1-1.4's. Now s/p RIGHT nephrectomy 01/2017.   3-Chronic Blood Loss Anemia -Hgb 7-9's following ongoing blood loss from right kidney cancer. s  He is quite sensitive to anemia having worsening fatigue when Hgb <9. Transfused 3upRBC on admission for Hgb 7s. Hgb now stable post-op and >9.   4 - Disposition / Rehab - pt independent at [facility], on O2 (day), CPAP at night but lives independently. PT eval recs home health PT.    Today "[name]" is without complaints. In chair most of day yesterday. Dry weight down / improved edema, pulm status near [facility] per patient. PT eval recs HHPT.    Objective: Vital signs in last 24 hours: Temp:  [98.2 F (36.8 C)-98.7 F (37.1 C)] 98.7 F (37.1 C) (03/09 0340) Pulse Rate:  [75-98] 96 (03/09 0340) Resp:  [14-23] 16 (03/09 0340) BP: (98-131)/(44-80) 120/68 (03/09 0340) SpO2:  [90 %-100 %] 90 % (03/09 0340) Weight:  [137.3 kg (302 lb 11.1 oz)-139.3 kg (307 lb 1.6 oz)] 137.3 kg (302 lb 11.1 oz) (03/09 0556) Last BM Date: 01/30/17  Intake/Output from previous day: 03/08 0701 - 03/09 0700 In: 220 [P.O.:220] Out: 3690 [Urine:3225; Drains:465] Intake/Output this shift: Total I/O In: 220 [P.O.:220] Out: 1565 [Urine:1325; Drains:240]  General appearance: alert, cooperative and appears stated age Eyes: negative, wearing glasses Nose: Nares normal. Septum midline. Mucosa normal. No drainage or sinus tenderness. Throat: lips, mucosa, and tongue normal; teeth and gums normal Neck: supple, symmetrical, trachea midline Back: symmetric, no curvature. ROM normal.  No CVA tenderness. Resp: non-labored on low [place] O2. No wheezes.  Cardio: Mild tachycardia by bedside monitor.  GI: Obese, recent surgical sites c/d/i. JP with serous fluid.  Male genitalia: normal, Foley with clearing urine, no clots.  Extremities: extremities normal, atraumatic, no cyanosis or edema Skin: Skin color, texture, turgor normal. No rashes or lesions Lymph nodes: Cervical, supraclavicular, and axillary nodes normal. Neurologic: Grossly normal Incision/Wound: recent surgical sites c/d/i.   Lab Results:   Recent Labs  01/31/17 1505 02/01/17 0645  02/02/17 0314 02/03/17 0329  WBC 4.4 4.0  --   --   --   HGB 7.7* 8.7*  < > 9.6* 10.2*  HCT 23.7* 27.3*  < > 29.2* 31.3*  PLT 184 172  --   --   --   < > = values in this interval not displayed. BMET  Recent Labs  02/02/17 0314 02/03/17 0329  NA 136 134*  K 4.3 3.9  CL 102 99*  CO2 27 28  GLUCOSE 132* 106*  BUN 11 20  CREATININE 1.08 1.17  CALCIUM 9.1 8.9   PT/INR No results for input(s): LABPROT, INR in the last 72 hours. ABG No results for input(s): PHART, HCO3 in the last 72 hours.  Invalid input(s): PCO2, PO2  Studies/Results: Dg Retrograde Pyelogram  Result Date: 02/01/2017 CLINICAL DATA:  82 year old male with a history of right-sided ureteral stent placement. EXAM: RETROGRADE PYELOGRAM COMPARISON:  CT 01/20/2017, 01/01/2017, prior retrograde 09/10/2015 FINDINGS: Limited intraoperative fluoroscopic spot images during retrograde pyelogram. Initial image demonstrates ureteral scope projecting over the pelvis with retrograde contrast within the distal  right ureter. Incomplete opacification of the right collecting system and right ureter. Final image demonstrates the distal aspect of a right ureteral stent with the distal loop formed in the region of the urinary bladder. IMPRESSION: Limited images during right-sided retrograde pyelogram demonstrate placement of a right ureteral stent, partially imaged. Please refer  to the dictated operative report for full details of intraoperative findings and procedure. Signed, Dulcy Fanny. Earleen Newport, DO [facility] Radiology Electronically Signed   By: Corrie Mckusick D.O.   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 Path penidng. No further cancer-directed therapy this admission.   Continue JP to help with volume status, continue foley at discharge, will obtain cystogram at f/u prior to removal.   2 - Chronic Renal Insufficiency / Solitary LEFT kidney -GFR remains amazingly good, I suspect his right kidney was not providing much filtration.   3-Chronic Blood Loss Anemia -improving now that source of anemia (chonically bleeding kidney) now out.   4 - Disposition / Rehab - Transfer to monitored floor today. Will submit reqeust for HHPT, likely home tomorrow or late afternoon today based on current progress.     [facility], Kaleo Condrey 02/03/2017

## 2017-02-03 NOTE — Progress Notes (Signed)
PT Cancellation Note  Patient Details Name: [facility] MRN: [id number] DOB: [birth year]   Cancelled Treatment:    Reason Eval/Treat Not Completed: Fatigue/lethargy limiting ability to participate (patient is sleeping and family requests to allow to rest. )Family reports that the patient has had multiple falls, home alone while wife works. Recommend SNF for rehab.    Marcelino Freestone PT [phone]  02/03/2017, 4:09 PM

## 2017-02-04 DIAGNOSIS — N289 Disorder of kidney and ureter, unspecified: Secondary | ICD-10-CM | POA: Diagnosis not present

## 2017-02-04 DIAGNOSIS — R0602 Shortness of breath: Secondary | ICD-10-CM | POA: Diagnosis not present

## 2017-02-04 DIAGNOSIS — R52 Pain, unspecified: Secondary | ICD-10-CM | POA: Diagnosis not present

## 2017-02-04 DIAGNOSIS — A499 Bacterial infection, unspecified: Secondary | ICD-10-CM | POA: Diagnosis not present

## 2017-02-04 DIAGNOSIS — R2681 Unsteadiness on feet: Secondary | ICD-10-CM | POA: Diagnosis not present

## 2017-02-04 DIAGNOSIS — I10 Essential (primary) hypertension: Secondary | ICD-10-CM | POA: Diagnosis not present

## 2017-02-04 DIAGNOSIS — E7800 Pure hypercholesterolemia, unspecified: Secondary | ICD-10-CM | POA: Diagnosis not present

## 2017-02-04 DIAGNOSIS — E876 Hypokalemia: Secondary | ICD-10-CM | POA: Diagnosis not present

## 2017-02-04 DIAGNOSIS — I959 Hypotension, unspecified: Secondary | ICD-10-CM | POA: Diagnosis not present

## 2017-02-04 DIAGNOSIS — J189 Pneumonia, unspecified organism: Secondary | ICD-10-CM | POA: Diagnosis not present

## 2017-02-04 DIAGNOSIS — K210 Gastro-esophageal reflux disease with esophagitis: Secondary | ICD-10-CM | POA: Diagnosis not present

## 2017-02-04 DIAGNOSIS — K219 Gastro-esophageal reflux disease without esophagitis: Secondary | ICD-10-CM | POA: Diagnosis not present

## 2017-02-04 DIAGNOSIS — I509 Heart failure, unspecified: Secondary | ICD-10-CM | POA: Diagnosis not present

## 2017-02-04 DIAGNOSIS — J449 Chronic obstructive pulmonary disease, unspecified: Secondary | ICD-10-CM | POA: Diagnosis not present

## 2017-02-04 DIAGNOSIS — N189 Chronic kidney disease, unspecified: Secondary | ICD-10-CM | POA: Diagnosis not present

## 2017-02-04 DIAGNOSIS — K5900 Constipation, unspecified: Secondary | ICD-10-CM | POA: Diagnosis not present

## 2017-02-04 DIAGNOSIS — C651 Malignant neoplasm of right renal pelvis: Secondary | ICD-10-CM | POA: Diagnosis not present

## 2017-02-04 DIAGNOSIS — R0989 Other specified symptoms and signs involving the circulatory and respiratory systems: Secondary | ICD-10-CM | POA: Diagnosis not present

## 2017-02-04 DIAGNOSIS — N401 Enlarged prostate with lower urinary tract symptoms: Secondary | ICD-10-CM | POA: Diagnosis not present

## 2017-02-04 DIAGNOSIS — C801 Malignant (primary) neoplasm, unspecified: Secondary | ICD-10-CM | POA: Diagnosis not present

## 2017-02-04 DIAGNOSIS — D509 Iron deficiency anemia, unspecified: Secondary | ICD-10-CM | POA: Diagnosis not present

## 2017-02-04 DIAGNOSIS — M6281 Muscle weakness (generalized): Secondary | ICD-10-CM | POA: Diagnosis not present

## 2017-02-04 DIAGNOSIS — D649 Anemia, unspecified: Secondary | ICD-10-CM | POA: Diagnosis not present

## 2017-02-04 NOTE — Care Management Note (Signed)
Case Management Note  Patient Details  Name: NIEL PERETTI MRN: [id number] Date of Birth: 12-21-1934  Subjective/Objective:     Chronic anemia, s/p RAL right nephroureterectomy on 02/01/2017                 Action/Plan: Discharge Planning: Chart reviewed. Spoke to wife. Scheduled dc to SNF-rehab. CSW following for placement.    PCP Townsend Roger    Expected Discharge Date:  02/04/17               Expected Discharge Plan:  Skilled Nursing Facility  In-House Referral:  Clinical Social Work  Discharge planning Services  CM Consult  Post Acute Care Choice:  NA Choice offered to:  NA  DME Arranged:  N/A DME Agency:  NA  HH Arranged:  NA HH Agency:  NA  Status of Service:  Completed, signed off  If discussed at [employer] of Stay Meetings, dates discussed:    Additional Comments:  Erenest Rasher, RN 02/04/2017, 3:12 PM

## 2017-02-04 NOTE — Discharge Summary (Signed)
Physician Discharge Summary  Patient ID: [facility] Wang MRN: [id number] DOB/AGE: June 04, 1935 82 y.o.  Admit date: 01/31/2017 Discharge date: 02/04/2017  Admission Diagnoses: Right UTUC  Discharge Diagnoses: Right hg T4 UTUC with -sm  Discharged Condition: fair  Hospital Course:   [name] is a 82 y.o. male who is s/p RAL right nephroureterectomy on 02/01/2017 with Dr. Tresa Moore for right upper tract renal pelvis mass, refractory bleeding with anemia. He was pre-admitted on 01/31/17 for 2U pRBC transfusion in setting of chronic anemia.  The patient tolerated the procedure well, was extubated in the OR and taken to the recovery unit for routine postoperative care. They were then transferred to the floor. By POD3 he had met the usual goals for discharge including having pain controlled with PO PRN medications, and tolerating a regular diet. His JP drain was removed prior to discharge after JP Cr showed no evidence of urine leak. Foley catheter will remain in place until follow up. PT worked with the patient postop with overall final recommendations of SNF placement for further rehabilitation.   The patient will follow up with [facility] on 02/16/2017. They will be discharged with prescriptions for vicodin.   Cr on POD2 was 1.17.    Consults: physical therapy  Significant Diagnostic Studies: labs: WNL postop  Treatments: surgery: as noted above  Discharge Exam: Blood pressure 124/61, pulse 99, temperature 98.6 F (37 C), temperature source Oral, resp. rate 17, height 6' 1"  (1.854 m), weight (!) 141.6 kg (312 lb 3.2 oz), SpO2 93 %.  General:  well-developed and well-nourished obese male in NAD, lying in bed, alert & oriented, pleasant HEENT: [place]/AT, EOMI, sclera anicteric, hearing grossly intact, no nasal discharge, MMM Respiratory: nonlabored respirations, on nasal cannula Cardiovascular: pulse regular rate & rhythm Abdominal: soft, mildly & appropriately TTP, nondistended,  surgical incisions c/d/i without signs of exudate/erythema GU: Foley catheter draining yellow urine, JP drain with SS drainage (to be removed prior to discharge) Extremities: warm, well-perfused   Disposition: 01-Home or Self Care  Discharge Instructions    Activity as tolerated - No restrictions    Complete by:  As directed    Call MD for:    Complete by:  As directed    Temperature >101.5   Call MD for:  persistant nausea and vomiting    Complete by:  As directed    Call MD for:  redness, tenderness, or signs of infection (pain, swelling, redness, odor or green/yellow discharge around incision site)    Complete by:  As directed    Call MD for:  severe uncontrolled pain    Complete by:  As directed    Diet general    Complete by:  As directed    Discharge instructions    Complete by:  As directed    No heavy lifting (>10 pounds) for 6 weeks after surgery  You can shower but no soaking underwater (hot tubs, swimming pools, baths) for 4 weeks  Follow up with Dr. Tresa Moore in about 2 weeks for catheter removal   Increase activity slowly    Complete by:  As directed    No wound care    Complete by:  As directed      Allergies as of 02/04/2017      Reactions   Bee Venom Anaphylaxis   Penicillins Anaphylaxis, Hives, Other (See Comments)   Has patient had a PCN reaction causing immediate rash, facial/tongue/throat swelling, SOB or lightheadedness with hypotension: Yes Has patient had a PCN  reaction causing severe rash involving mucus membranes or skin necrosis: Yes Has patient had a PCN reaction that required hospitalization No Has patient had a PCN reaction occurring within the last 10 years: No If all of the above answers are "NO", then may proceed with Cephalosporin use.   Tramadol Other (See Comments)   Dizzy and loopy.       Medication List    STOP taking these medications   CALCIUM 1200+D3 PO   CoQ10 100 MG Caps     TAKE these medications   acetaminophen 500 MG  tablet Commonly known as:  TYLENOL Take 1,000 mg by mouth every 6 (six) hours as needed for moderate pain.   albuterol 108 (90 Base) MCG/ACT inhaler Commonly known as:  PROVENTIL HFA;VENTOLIN HFA Inhale 1-2 puffs into the lungs every 6 (six) hours as needed for wheezing or shortness of breath.   allopurinol 100 MG tablet Commonly known as:  ZYLOPRIM Take 100 mg by mouth daily.   ferrous sulfate 325 (65 FE) MG tablet Take 1 tablet (325 mg total) by mouth 3 (three) times daily with meals.   furosemide 40 MG tablet Commonly known as:  LASIX Take 40 mg by mouth 2 (two) times daily.   HYDROcodone-acetaminophen 5-325 MG tablet Commonly known as:  NORCO Take 1-2 tablets by mouth every 6 (six) hours as needed for moderate pain or severe pain. What changed:  how much to take  when to take this  reasons to take this   ipratropium-albuterol 0.5-2.5 (3) MG/3ML Soln Commonly known as:  DUONEB Take 3 mLs by nebulization every 6 (six) hours as needed (for wheezing/shortness of breath).   metoprolol 50 MG tablet Commonly known as:  LOPRESSOR Take 50 mg by mouth daily.   omeprazole 20 MG capsule Commonly known as:  PRILOSEC Take 20 mg by mouth daily.   polyethylene glycol packet Commonly known as:  MIRALAX / GLYCOLAX Take 17 g by mouth daily.   potassium chloride SA 20 MEQ tablet Commonly known as:  K-DUR,KLOR-CON Take 20 mEq by mouth daily.   pravastatin 80 MG tablet Commonly known as:  PRAVACHOL Take 80 mg by mouth daily.      Follow-up Information    Alexis Frock, MD On 02/16/2017.   Specialty:  Urology Why:  at 11AM for post-op visit. Dr. Tresa Moore will call you with pathology results when available.  Contact information: [place] [place] [address] [phone]           Signed: Burnice Wang 02/04/2017, 10:10 AM

## 2017-02-04 NOTE — Progress Notes (Signed)
Attempted to call report to [facility].  Left message with name and phone number to call back for report.

## 2017-02-04 NOTE — Clinical Social Work Placement (Signed)
Patient is set to discharge [facility] today. Patient & wife & daughter, [facility] aware. Discharge packet given to RN, [facility] called for transport.     Raynaldo Opitz, [facility] Clinical Social Worker cell #: [phone]    CLINICAL SOCIAL WORK PLACEMENT  NOTE  Date:  02/04/2017  Patient Details  Name: [facility] MRN: [id number] Date of Birth: July 21, 1935  Clinical Social Work is seeking post-discharge placement for this patient at the [place] level of care (*CSW will initial, date and re-position this form in  chart as items are completed):  Yes   Patient/family provided with [place] Work Department's list of facilities offering this level of care within the geographic area requested by the patient (or if unable, by the patient's family).  Yes   Patient/family informed of their freedom to choose among providers that offer the needed level of care, that participate in Medicare, Medicaid or managed care program needed by the patient, have an available bed and are willing to accept the patient.  Yes   Patient/family informed of [place]'s ownership interest in [facility] and [facility], as well as of the fact that they are under no obligation to receive care at these facilities.  PASRR submitted to EDS on 02/04/17     PASRR number received on 02/04/17     Existing PASRR number confirmed on       FL2 transmitted to all facilities in geographic area requested by pt/family on 02/04/17     FL2 transmitted to all facilities within larger geographic area on       Patient informed that his/her managed care company has contracts with or will negotiate with certain facilities, including the following:        Yes   Patient/family informed of bed offers received.  Patient chooses bed at [facility]     Physician recommends and patient chooses bed at      Patient to be transferred to  [facility] on 02/04/17.  Patient to be transferred to facility by [facility]     Patient family notified on 02/04/17 of transfer.  Name of family member notified:  patient's wife & daughter, [facility] at bedside     PHYSICIAN       Additional Comment:    _______________________________________________ Standley Brooking, LCSW 02/04/2017, 4:07 PM

## 2017-02-04 NOTE — Care Management Important Message (Signed)
Important Message  Patient Details  Name: [facility] MRN: [id number] Date of Birth: 19-Jul-1935   Medicare Important Message Given:  Yes    Erenest Rasher, RN 02/04/2017, 3:12 PM

## 2017-02-06 DIAGNOSIS — I509 Heart failure, unspecified: Secondary | ICD-10-CM | POA: Diagnosis not present

## 2017-02-06 DIAGNOSIS — K219 Gastro-esophageal reflux disease without esophagitis: Secondary | ICD-10-CM | POA: Diagnosis not present

## 2017-02-06 DIAGNOSIS — J449 Chronic obstructive pulmonary disease, unspecified: Secondary | ICD-10-CM | POA: Diagnosis not present

## 2017-02-06 DIAGNOSIS — D649 Anemia, unspecified: Secondary | ICD-10-CM | POA: Diagnosis not present

## 2017-02-09 DIAGNOSIS — J189 Pneumonia, unspecified organism: Secondary | ICD-10-CM | POA: Diagnosis not present

## 2017-02-09 DIAGNOSIS — I509 Heart failure, unspecified: Secondary | ICD-10-CM | POA: Diagnosis not present

## 2017-02-10 DIAGNOSIS — D649 Anemia, unspecified: Secondary | ICD-10-CM | POA: Diagnosis not present

## 2017-02-10 DIAGNOSIS — I959 Hypotension, unspecified: Secondary | ICD-10-CM | POA: Diagnosis not present

## 2017-02-10 DIAGNOSIS — A499 Bacterial infection, unspecified: Secondary | ICD-10-CM | POA: Diagnosis not present

## 2017-02-10 DIAGNOSIS — C641 Malignant neoplasm of right kidney, except renal pelvis: Secondary | ICD-10-CM | POA: Diagnosis not present

## 2017-02-10 DIAGNOSIS — Z79899 Other long term (current) drug therapy: Secondary | ICD-10-CM | POA: Diagnosis not present

## 2017-02-10 DIAGNOSIS — Z936 Other artificial openings of urinary tract status: Secondary | ICD-10-CM | POA: Diagnosis not present

## 2017-02-10 DIAGNOSIS — R2689 Other abnormalities of gait and mobility: Secondary | ICD-10-CM | POA: Diagnosis not present

## 2017-02-10 DIAGNOSIS — R262 Difficulty in walking, not elsewhere classified: Secondary | ICD-10-CM | POA: Diagnosis not present

## 2017-02-10 DIAGNOSIS — I509 Heart failure, unspecified: Secondary | ICD-10-CM | POA: Diagnosis not present

## 2017-02-10 DIAGNOSIS — M6281 Muscle weakness (generalized): Secondary | ICD-10-CM | POA: Diagnosis not present

## 2017-02-10 DIAGNOSIS — M6259 Muscle wasting and atrophy, not elsewhere classified, multiple sites: Secondary | ICD-10-CM | POA: Diagnosis not present

## 2017-02-10 DIAGNOSIS — R0600 Dyspnea, unspecified: Secondary | ICD-10-CM | POA: Diagnosis not present

## 2017-02-10 DIAGNOSIS — R2681 Unsteadiness on feet: Secondary | ICD-10-CM | POA: Diagnosis not present

## 2017-02-10 DIAGNOSIS — G4730 Sleep apnea, unspecified: Secondary | ICD-10-CM | POA: Diagnosis not present

## 2017-02-10 DIAGNOSIS — I10 Essential (primary) hypertension: Secondary | ICD-10-CM | POA: Diagnosis not present

## 2017-02-10 DIAGNOSIS — R5381 Other malaise: Secondary | ICD-10-CM | POA: Diagnosis not present

## 2017-02-10 DIAGNOSIS — C651 Malignant neoplasm of right renal pelvis: Secondary | ICD-10-CM | POA: Diagnosis not present

## 2017-02-10 DIAGNOSIS — J449 Chronic obstructive pulmonary disease, unspecified: Secondary | ICD-10-CM | POA: Diagnosis not present

## 2017-02-10 DIAGNOSIS — N183 Chronic kidney disease, stage 3 (moderate): Secondary | ICD-10-CM | POA: Diagnosis not present

## 2017-02-21 DIAGNOSIS — R5381 Other malaise: Secondary | ICD-10-CM | POA: Diagnosis not present

## 2017-02-21 DIAGNOSIS — C641 Malignant neoplasm of right kidney, except renal pelvis: Secondary | ICD-10-CM | POA: Diagnosis not present

## 2017-02-21 DIAGNOSIS — D649 Anemia, unspecified: Secondary | ICD-10-CM | POA: Diagnosis not present

## 2017-02-21 DIAGNOSIS — N183 Chronic kidney disease, stage 3 (moderate): Secondary | ICD-10-CM | POA: Diagnosis not present

## 2017-02-23 NOTE — Progress Notes (Signed)
Pt discharged to [facility] on 02/04/2017. Medication reconciliation completed by PharmD with [facility] on 02/23/2017. No issues noted.   Angela Burke, PharmD, BCPS Pharmacy Resident Pager: [phone]

## 2017-02-24 ENCOUNTER — Other Ambulatory Visit: Payer: Self-pay | Admitting: *Deleted

## 2017-02-24 NOTE — Patient Outreach (Signed)
[facility]) Care Management  02/24/2017  [facility] Wang 09/29/1935 [id number]   Met with patient at bedside of facility 02/23/17. Patient was hospitalized 3/6-3/10 for right nephroureterectomy, hx of bladder cancer. He was then sent to SNF for short term rehab. Patient also has history of HF.  Patient has planned discharge for 02/24/17. He will discharge home with wife.  Patient confirms he monitors his weight and HF symptoms daily. He states his wife keeps him on a low sodium diet.   RNCM reviewed [facility] care management program. Patient denies any issues at this time with transportation or medication affordability. Patient accepted a brochure with RNCM contact.   Plan to sign off. Royetta Crochet. Laymond Purser, RN, BSN, [place] [phone]) Business Cell  [phone]) Toll Free Office

## 2017-02-26 DIAGNOSIS — I872 Venous insufficiency (chronic) (peripheral): Secondary | ICD-10-CM | POA: Diagnosis not present

## 2017-02-26 DIAGNOSIS — D509 Iron deficiency anemia, unspecified: Secondary | ICD-10-CM | POA: Diagnosis not present

## 2017-02-26 DIAGNOSIS — I130 Hypertensive heart and chronic kidney disease with heart failure and stage 1 through stage 4 chronic kidney disease, or unspecified chronic kidney disease: Secondary | ICD-10-CM | POA: Diagnosis not present

## 2017-02-26 DIAGNOSIS — J449 Chronic obstructive pulmonary disease, unspecified: Secondary | ICD-10-CM | POA: Diagnosis not present

## 2017-02-26 DIAGNOSIS — I5033 Acute on chronic diastolic (congestive) heart failure: Secondary | ICD-10-CM | POA: Diagnosis not present

## 2017-02-26 DIAGNOSIS — C679 Malignant neoplasm of bladder, unspecified: Secondary | ICD-10-CM | POA: Diagnosis not present

## 2017-02-28 DIAGNOSIS — I130 Hypertensive heart and chronic kidney disease with heart failure and stage 1 through stage 4 chronic kidney disease, or unspecified chronic kidney disease: Secondary | ICD-10-CM | POA: Diagnosis not present

## 2017-02-28 DIAGNOSIS — C679 Malignant neoplasm of bladder, unspecified: Secondary | ICD-10-CM | POA: Diagnosis not present

## 2017-02-28 DIAGNOSIS — I5033 Acute on chronic diastolic (congestive) heart failure: Secondary | ICD-10-CM | POA: Diagnosis not present

## 2017-02-28 DIAGNOSIS — J449 Chronic obstructive pulmonary disease, unspecified: Secondary | ICD-10-CM | POA: Diagnosis not present

## 2017-02-28 DIAGNOSIS — D509 Iron deficiency anemia, unspecified: Secondary | ICD-10-CM | POA: Diagnosis not present

## 2017-02-28 DIAGNOSIS — I872 Venous insufficiency (chronic) (peripheral): Secondary | ICD-10-CM | POA: Diagnosis not present

## 2017-03-03 DIAGNOSIS — R5381 Other malaise: Secondary | ICD-10-CM | POA: Diagnosis not present

## 2017-03-06 DIAGNOSIS — D500 Iron deficiency anemia secondary to blood loss (chronic): Secondary | ICD-10-CM | POA: Diagnosis not present

## 2017-03-06 DIAGNOSIS — I5042 Chronic combined systolic (congestive) and diastolic (congestive) heart failure: Secondary | ICD-10-CM | POA: Diagnosis not present

## 2017-03-06 DIAGNOSIS — N052 Unspecified nephritic syndrome with diffuse membranous glomerulonephritis: Secondary | ICD-10-CM | POA: Diagnosis not present

## 2017-03-06 DIAGNOSIS — I129 Hypertensive chronic kidney disease with stage 1 through stage 4 chronic kidney disease, or unspecified chronic kidney disease: Secondary | ICD-10-CM | POA: Diagnosis not present

## 2017-03-06 DIAGNOSIS — C679 Malignant neoplasm of bladder, unspecified: Secondary | ICD-10-CM | POA: Diagnosis not present

## 2017-03-06 DIAGNOSIS — N2889 Other specified disorders of kidney and ureter: Secondary | ICD-10-CM | POA: Diagnosis not present

## 2017-03-09 DIAGNOSIS — I872 Venous insufficiency (chronic) (peripheral): Secondary | ICD-10-CM | POA: Diagnosis not present

## 2017-03-09 DIAGNOSIS — I5033 Acute on chronic diastolic (congestive) heart failure: Secondary | ICD-10-CM | POA: Diagnosis not present

## 2017-03-09 DIAGNOSIS — D509 Iron deficiency anemia, unspecified: Secondary | ICD-10-CM | POA: Diagnosis not present

## 2017-03-09 DIAGNOSIS — I130 Hypertensive heart and chronic kidney disease with heart failure and stage 1 through stage 4 chronic kidney disease, or unspecified chronic kidney disease: Secondary | ICD-10-CM | POA: Diagnosis not present

## 2017-03-09 DIAGNOSIS — C679 Malignant neoplasm of bladder, unspecified: Secondary | ICD-10-CM | POA: Diagnosis not present

## 2017-03-09 DIAGNOSIS — J449 Chronic obstructive pulmonary disease, unspecified: Secondary | ICD-10-CM | POA: Diagnosis not present

## 2017-03-10 DIAGNOSIS — I130 Hypertensive heart and chronic kidney disease with heart failure and stage 1 through stage 4 chronic kidney disease, or unspecified chronic kidney disease: Secondary | ICD-10-CM | POA: Diagnosis not present

## 2017-03-10 DIAGNOSIS — I872 Venous insufficiency (chronic) (peripheral): Secondary | ICD-10-CM | POA: Diagnosis not present

## 2017-03-10 DIAGNOSIS — D509 Iron deficiency anemia, unspecified: Secondary | ICD-10-CM | POA: Diagnosis not present

## 2017-03-10 DIAGNOSIS — C679 Malignant neoplasm of bladder, unspecified: Secondary | ICD-10-CM | POA: Diagnosis not present

## 2017-03-10 DIAGNOSIS — J449 Chronic obstructive pulmonary disease, unspecified: Secondary | ICD-10-CM | POA: Diagnosis not present

## 2017-03-10 DIAGNOSIS — I5033 Acute on chronic diastolic (congestive) heart failure: Secondary | ICD-10-CM | POA: Diagnosis not present

## 2017-03-13 DIAGNOSIS — S161XXA Strain of muscle, fascia and tendon at neck level, initial encounter: Secondary | ICD-10-CM | POA: Diagnosis not present

## 2017-03-13 DIAGNOSIS — S29019A Strain of muscle and tendon of unspecified wall of thorax, initial encounter: Secondary | ICD-10-CM | POA: Diagnosis not present

## 2017-03-13 DIAGNOSIS — M9901 Segmental and somatic dysfunction of cervical region: Secondary | ICD-10-CM | POA: Diagnosis not present

## 2017-03-13 DIAGNOSIS — M5387 Other specified dorsopathies, lumbosacral region: Secondary | ICD-10-CM | POA: Diagnosis not present

## 2017-03-13 DIAGNOSIS — M9903 Segmental and somatic dysfunction of lumbar region: Secondary | ICD-10-CM | POA: Diagnosis not present

## 2017-03-13 DIAGNOSIS — M9902 Segmental and somatic dysfunction of thoracic region: Secondary | ICD-10-CM | POA: Diagnosis not present

## 2017-03-14 DIAGNOSIS — J449 Chronic obstructive pulmonary disease, unspecified: Secondary | ICD-10-CM | POA: Diagnosis not present

## 2017-03-14 DIAGNOSIS — I5033 Acute on chronic diastolic (congestive) heart failure: Secondary | ICD-10-CM | POA: Diagnosis not present

## 2017-03-14 DIAGNOSIS — D509 Iron deficiency anemia, unspecified: Secondary | ICD-10-CM | POA: Diagnosis not present

## 2017-03-14 DIAGNOSIS — I130 Hypertensive heart and chronic kidney disease with heart failure and stage 1 through stage 4 chronic kidney disease, or unspecified chronic kidney disease: Secondary | ICD-10-CM | POA: Diagnosis not present

## 2017-03-14 DIAGNOSIS — I872 Venous insufficiency (chronic) (peripheral): Secondary | ICD-10-CM | POA: Diagnosis not present

## 2017-03-14 DIAGNOSIS — C679 Malignant neoplasm of bladder, unspecified: Secondary | ICD-10-CM | POA: Diagnosis not present

## 2017-03-16 DIAGNOSIS — Z9981 Dependence on supplemental oxygen: Secondary | ICD-10-CM | POA: Diagnosis not present

## 2017-03-16 DIAGNOSIS — M9901 Segmental and somatic dysfunction of cervical region: Secondary | ICD-10-CM | POA: Diagnosis not present

## 2017-03-16 DIAGNOSIS — I130 Hypertensive heart and chronic kidney disease with heart failure and stage 1 through stage 4 chronic kidney disease, or unspecified chronic kidney disease: Secondary | ICD-10-CM | POA: Diagnosis not present

## 2017-03-16 DIAGNOSIS — G4733 Obstructive sleep apnea (adult) (pediatric): Secondary | ICD-10-CM | POA: Diagnosis not present

## 2017-03-16 DIAGNOSIS — D509 Iron deficiency anemia, unspecified: Secondary | ICD-10-CM | POA: Diagnosis not present

## 2017-03-16 DIAGNOSIS — E662 Morbid (severe) obesity with alveolar hypoventilation: Secondary | ICD-10-CM | POA: Diagnosis not present

## 2017-03-16 DIAGNOSIS — J449 Chronic obstructive pulmonary disease, unspecified: Secondary | ICD-10-CM | POA: Diagnosis not present

## 2017-03-16 DIAGNOSIS — S29019A Strain of muscle and tendon of unspecified wall of thorax, initial encounter: Secondary | ICD-10-CM | POA: Diagnosis not present

## 2017-03-16 DIAGNOSIS — M5387 Other specified dorsopathies, lumbosacral region: Secondary | ICD-10-CM | POA: Diagnosis not present

## 2017-03-16 DIAGNOSIS — M9903 Segmental and somatic dysfunction of lumbar region: Secondary | ICD-10-CM | POA: Diagnosis not present

## 2017-03-16 DIAGNOSIS — N189 Chronic kidney disease, unspecified: Secondary | ICD-10-CM | POA: Diagnosis not present

## 2017-03-16 DIAGNOSIS — M9902 Segmental and somatic dysfunction of thoracic region: Secondary | ICD-10-CM | POA: Diagnosis not present

## 2017-03-16 DIAGNOSIS — S161XXA Strain of muscle, fascia and tendon at neck level, initial encounter: Secondary | ICD-10-CM | POA: Diagnosis not present

## 2017-03-16 DIAGNOSIS — Z483 Aftercare following surgery for neoplasm: Secondary | ICD-10-CM | POA: Diagnosis not present

## 2017-03-16 DIAGNOSIS — I872 Venous insufficiency (chronic) (peripheral): Secondary | ICD-10-CM | POA: Diagnosis not present

## 2017-03-16 DIAGNOSIS — I5033 Acute on chronic diastolic (congestive) heart failure: Secondary | ICD-10-CM | POA: Diagnosis not present

## 2017-03-16 DIAGNOSIS — C679 Malignant neoplasm of bladder, unspecified: Secondary | ICD-10-CM | POA: Diagnosis not present

## 2017-03-16 DIAGNOSIS — Z6841 Body Mass Index (BMI) 40.0 and over, adult: Secondary | ICD-10-CM | POA: Diagnosis not present

## 2017-03-16 DIAGNOSIS — C641 Malignant neoplasm of right kidney, except renal pelvis: Secondary | ICD-10-CM | POA: Diagnosis not present

## 2017-03-16 DIAGNOSIS — K7460 Unspecified cirrhosis of liver: Secondary | ICD-10-CM | POA: Diagnosis not present

## 2017-03-16 DIAGNOSIS — Z87891 Personal history of nicotine dependence: Secondary | ICD-10-CM | POA: Diagnosis not present

## 2017-03-20 DIAGNOSIS — M9903 Segmental and somatic dysfunction of lumbar region: Secondary | ICD-10-CM | POA: Diagnosis not present

## 2017-03-20 DIAGNOSIS — S161XXA Strain of muscle, fascia and tendon at neck level, initial encounter: Secondary | ICD-10-CM | POA: Diagnosis not present

## 2017-03-20 DIAGNOSIS — S29019A Strain of muscle and tendon of unspecified wall of thorax, initial encounter: Secondary | ICD-10-CM | POA: Diagnosis not present

## 2017-03-20 DIAGNOSIS — M5387 Other specified dorsopathies, lumbosacral region: Secondary | ICD-10-CM | POA: Diagnosis not present

## 2017-03-20 DIAGNOSIS — M9901 Segmental and somatic dysfunction of cervical region: Secondary | ICD-10-CM | POA: Diagnosis not present

## 2017-03-20 DIAGNOSIS — M9902 Segmental and somatic dysfunction of thoracic region: Secondary | ICD-10-CM | POA: Diagnosis not present

## 2017-03-22 DIAGNOSIS — I130 Hypertensive heart and chronic kidney disease with heart failure and stage 1 through stage 4 chronic kidney disease, or unspecified chronic kidney disease: Secondary | ICD-10-CM | POA: Diagnosis not present

## 2017-03-22 DIAGNOSIS — N189 Chronic kidney disease, unspecified: Secondary | ICD-10-CM | POA: Diagnosis not present

## 2017-03-22 DIAGNOSIS — I5033 Acute on chronic diastolic (congestive) heart failure: Secondary | ICD-10-CM | POA: Diagnosis not present

## 2017-03-22 DIAGNOSIS — Z483 Aftercare following surgery for neoplasm: Secondary | ICD-10-CM | POA: Diagnosis not present

## 2017-03-22 DIAGNOSIS — D509 Iron deficiency anemia, unspecified: Secondary | ICD-10-CM | POA: Diagnosis not present

## 2017-03-22 DIAGNOSIS — C641 Malignant neoplasm of right kidney, except renal pelvis: Secondary | ICD-10-CM | POA: Diagnosis not present

## 2017-03-23 DIAGNOSIS — S29019A Strain of muscle and tendon of unspecified wall of thorax, initial encounter: Secondary | ICD-10-CM | POA: Diagnosis not present

## 2017-03-23 DIAGNOSIS — M9902 Segmental and somatic dysfunction of thoracic region: Secondary | ICD-10-CM | POA: Diagnosis not present

## 2017-03-23 DIAGNOSIS — S161XXA Strain of muscle, fascia and tendon at neck level, initial encounter: Secondary | ICD-10-CM | POA: Diagnosis not present

## 2017-03-23 DIAGNOSIS — M9903 Segmental and somatic dysfunction of lumbar region: Secondary | ICD-10-CM | POA: Diagnosis not present

## 2017-03-23 DIAGNOSIS — M9901 Segmental and somatic dysfunction of cervical region: Secondary | ICD-10-CM | POA: Diagnosis not present

## 2017-03-23 DIAGNOSIS — M5387 Other specified dorsopathies, lumbosacral region: Secondary | ICD-10-CM | POA: Diagnosis not present

## 2017-03-27 DIAGNOSIS — M9903 Segmental and somatic dysfunction of lumbar region: Secondary | ICD-10-CM | POA: Diagnosis not present

## 2017-03-27 DIAGNOSIS — R310 Gross hematuria: Secondary | ICD-10-CM | POA: Diagnosis not present

## 2017-03-27 DIAGNOSIS — M5387 Other specified dorsopathies, lumbosacral region: Secondary | ICD-10-CM | POA: Diagnosis not present

## 2017-03-27 DIAGNOSIS — D649 Anemia, unspecified: Secondary | ICD-10-CM | POA: Diagnosis not present

## 2017-03-27 DIAGNOSIS — K5790 Diverticulosis of intestine, part unspecified, without perforation or abscess without bleeding: Secondary | ICD-10-CM | POA: Diagnosis not present

## 2017-03-27 DIAGNOSIS — S29019A Strain of muscle and tendon of unspecified wall of thorax, initial encounter: Secondary | ICD-10-CM | POA: Diagnosis not present

## 2017-03-27 DIAGNOSIS — D689 Coagulation defect, unspecified: Secondary | ICD-10-CM | POA: Diagnosis not present

## 2017-03-27 DIAGNOSIS — S161XXA Strain of muscle, fascia and tendon at neck level, initial encounter: Secondary | ICD-10-CM | POA: Diagnosis not present

## 2017-03-27 DIAGNOSIS — M9901 Segmental and somatic dysfunction of cervical region: Secondary | ICD-10-CM | POA: Diagnosis not present

## 2017-03-27 DIAGNOSIS — E611 Iron deficiency: Secondary | ICD-10-CM | POA: Diagnosis not present

## 2017-03-27 DIAGNOSIS — M9902 Segmental and somatic dysfunction of thoracic region: Secondary | ICD-10-CM | POA: Diagnosis not present

## 2017-03-28 ENCOUNTER — Inpatient Hospital Stay (HOSPITAL_COMMUNITY)
Admission: EM | Admit: 2017-03-28 | Discharge: 2017-03-30 | DRG: 669 | Disposition: A | Discharge status: Home Health | Payer: Medicare Other | Attending: Urology | Admitting: Urology

## 2017-03-28 ENCOUNTER — Encounter (HOSPITAL_COMMUNITY): Payer: Self-pay | Admitting: Emergency Medicine

## 2017-03-28 DIAGNOSIS — N183 Chronic kidney disease, stage 3 (moderate): Secondary | ICD-10-CM | POA: Diagnosis present

## 2017-03-28 DIAGNOSIS — R338 Other retention of urine: Secondary | ICD-10-CM | POA: Diagnosis not present

## 2017-03-28 DIAGNOSIS — R310 Gross hematuria: Secondary | ICD-10-CM | POA: Diagnosis not present

## 2017-03-28 DIAGNOSIS — C675 Malignant neoplasm of bladder neck: Principal | ICD-10-CM | POA: Diagnosis present

## 2017-03-28 DIAGNOSIS — I509 Heart failure, unspecified: Secondary | ICD-10-CM | POA: Diagnosis present

## 2017-03-28 DIAGNOSIS — Z8249 Family history of ischemic heart disease and other diseases of the circulatory system: Secondary | ICD-10-CM | POA: Diagnosis not present

## 2017-03-28 DIAGNOSIS — Z905 Acquired absence of kidney: Secondary | ICD-10-CM | POA: Diagnosis not present

## 2017-03-28 DIAGNOSIS — Z87891 Personal history of nicotine dependence: Secondary | ICD-10-CM

## 2017-03-28 DIAGNOSIS — J449 Chronic obstructive pulmonary disease, unspecified: Secondary | ICD-10-CM | POA: Diagnosis present

## 2017-03-28 DIAGNOSIS — Z8551 Personal history of malignant neoplasm of bladder: Secondary | ICD-10-CM | POA: Diagnosis not present

## 2017-03-28 DIAGNOSIS — R339 Retention of urine, unspecified: Secondary | ICD-10-CM | POA: Diagnosis not present

## 2017-03-28 DIAGNOSIS — G4730 Sleep apnea, unspecified: Secondary | ICD-10-CM | POA: Diagnosis present

## 2017-03-28 DIAGNOSIS — N3289 Other specified disorders of bladder: Secondary | ICD-10-CM | POA: Diagnosis not present

## 2017-03-28 DIAGNOSIS — K219 Gastro-esophageal reflux disease without esophagitis: Secondary | ICD-10-CM | POA: Diagnosis present

## 2017-03-28 DIAGNOSIS — R319 Hematuria, unspecified: Secondary | ICD-10-CM

## 2017-03-28 DIAGNOSIS — N401 Enlarged prostate with lower urinary tract symptoms: Secondary | ICD-10-CM | POA: Diagnosis present

## 2017-03-28 DIAGNOSIS — Z419 Encounter for procedure for purposes other than remedying health state, unspecified: Secondary | ICD-10-CM

## 2017-03-28 DIAGNOSIS — I130 Hypertensive heart and chronic kidney disease with heart failure and stage 1 through stage 4 chronic kidney disease, or unspecified chronic kidney disease: Secondary | ICD-10-CM | POA: Diagnosis present

## 2017-03-28 DIAGNOSIS — Z6838 Body mass index (BMI) 38.0-38.9, adult: Secondary | ICD-10-CM

## 2017-03-28 DIAGNOSIS — E785 Hyperlipidemia, unspecified: Secondary | ICD-10-CM | POA: Diagnosis present

## 2017-03-28 LAB — URINALYSIS, ROUTINE W REFLEX MICROSCOPIC
Bilirubin Urine: NEGATIVE
GLUCOSE, UA: NEGATIVE mg/dL
Ketones, ur: NEGATIVE mg/dL
Leukocytes, UA: NEGATIVE
Nitrite: NEGATIVE
PH: 6 (ref 5.0–8.0)
PROTEIN: 100 mg/dL — AB
Specific Gravity, Urine: 1.009 (ref 1.005–1.030)
Squamous Epithelial / LPF: NONE SEEN

## 2017-03-28 LAB — CBC WITH DIFFERENTIAL/PLATELET
Basophils Absolute: 0 10*3/uL (ref 0.0–0.1)
Basophils Relative: 0 %
EOS PCT: 5 %
Eosinophils Absolute: 0.3 10*3/uL (ref 0.0–0.7)
HCT: 30.1 % — ABNORMAL LOW (ref 39.0–52.0)
Hemoglobin: 9.7 g/dL — ABNORMAL LOW (ref 13.0–17.0)
LYMPHS ABS: 0.8 10*3/uL (ref 0.7–4.0)
LYMPHS PCT: 14 %
MCH: 29.9 pg (ref 26.0–34.0)
MCHC: 32.2 g/dL (ref 30.0–36.0)
MCV: 92.9 fL (ref 78.0–100.0)
MONOS PCT: 11 %
Monocytes Absolute: 0.6 10*3/uL (ref 0.1–1.0)
Neutro Abs: 3.9 10*3/uL (ref 1.7–7.7)
Neutrophils Relative %: 70 %
PLATELETS: 158 10*3/uL (ref 150–400)
RBC: 3.24 MIL/uL — ABNORMAL LOW (ref 4.22–5.81)
RDW: 15.3 % (ref 11.5–15.5)
WBC: 5.5 10*3/uL (ref 4.0–10.5)

## 2017-03-28 LAB — BASIC METABOLIC PANEL
Anion gap: 7 (ref 5–15)
BUN: 17 mg/dL (ref 6–20)
CO2: 26 mmol/L (ref 22–32)
Calcium: 9.6 mg/dL (ref 8.9–10.3)
Chloride: 101 mmol/L (ref 101–111)
Creatinine, Ser: 0.91 mg/dL (ref 0.61–1.24)
GFR calc Af Amer: >60 mL/min (ref >60)
GFR calc non Af Amer: >60 mL/min (ref >60)
GLUCOSE: 141 mg/dL — AB (ref 65–99)
POTASSIUM: 4.1 mmol/L (ref 3.5–5.1)
Sodium: 134 mmol/L — ABNORMAL LOW (ref 135–145)

## 2017-03-28 MED ORDER — LIDOCAINE HCL 2 % EX GEL
1.0000 "application " | Freq: Once | CUTANEOUS | Status: AC | PRN
  Administered 2017-03-28: 1 via URETHRAL
  Filled 2017-03-28: qty 11

## 2017-03-28 MED ORDER — LIDOCAINE HCL 2 % EX GEL
1.0000 "application " | Freq: Once | CUTANEOUS | Status: DC | PRN
  Filled 2017-03-28: qty 11

## 2017-03-28 MED ORDER — ACETAMINOPHEN 325 MG PO TABS
650.0000 mg | ORAL_TABLET | ORAL | Status: DC | PRN
  Administered 2017-03-28: 650 mg via ORAL
  Filled 2017-03-28: qty 2

## 2017-03-28 MED ORDER — DIPHENHYDRAMINE HCL 12.5 MG/5ML PO ELIX
12.5000 mg | ORAL_SOLUTION | Freq: Four times a day (QID) | ORAL | Status: DC | PRN
  Administered 2017-03-29: 12.5 mg via ORAL
  Filled 2017-03-28: qty 5

## 2017-03-28 MED ORDER — DIPHENHYDRAMINE HCL 50 MG/ML IJ SOLN: 12.5000 mg | Freq: Four times a day (QID) | INTRAMUSCULAR | Status: DC | PRN

## 2017-03-28 MED ORDER — ONDANSETRON HCL 4 MG/2ML IJ SOLN: 4.0000 mg | INTRAMUSCULAR | Status: DC | PRN

## 2017-03-28 MED ORDER — CIPROFLOXACIN HCL 500 MG PO TABS
500.0000 mg | ORAL_TABLET | Freq: Once | ORAL | Status: AC
  Administered 2017-03-28: 500 mg via ORAL
  Filled 2017-03-28: qty 1

## 2017-03-28 MED ORDER — HYDROCODONE-ACETAMINOPHEN 5-325 MG PO TABS
1.0000 | ORAL_TABLET | ORAL | Status: DC | PRN
  Administered 2017-03-28 – 2017-03-30 (×6): 2 via ORAL
  Filled 2017-03-28 (×3): qty 2
  Filled 2017-03-28: qty 1
  Filled 2017-03-28 (×3): qty 2

## 2017-03-28 NOTE — ED Triage Notes (Signed)
Pt states he had a right nephrectomy on March 7th by Dr Tammi Klippel  Pt states for the past week he has been having hematuria with clots  Pt states it started after he strained to get out of a recliner that was motorized but the motor died when he was fully reclined so he had to strain to get up   Pt states he has not been able to urinate since 330 this morning

## 2017-03-28 NOTE — ED Notes (Signed)
Irrigated foley with 3000 ml of fluids

## 2017-03-28 NOTE — Consult Note (Signed)
Urology Consult   Physician requesting consult: Elie Confer  Reason for consult: Gross hematuria  History of Present Illness: Victor Wang is a 82 y.o. 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 Tresa Moore.  He presented to the ED today with c/o gross hematuria with clots and inability to void.  He states the hematuria has been present for approx 1 week.  He fell the day before the hematuria started and then had to strain to get out of his recliner the next day.  He has been unable to urinate since 3am.  He denies dysuria, F/C/N/V, constipation, flank pain, and abdominal pain.    Bladder scan in ED showed 429cc.  A 67ffoley was placed in the ED and CBIs started.  His output has gone from dark red to pink.  WBC 5.5, Cr 0.91, H/H 9.7/30.1.  UA nitrite negative, few bacteria, TNTC RBCs.   [facility] [place] and at the time was passing his urine without difficulty. He did have GRubybut was otherwise without complaint.  He was instructed to continue to monitor his sx and if they changed/progressed to come back for eval and possible cystoscopy.      Past Medical History:  Diagnosis Date  . Anemia   . Arthritis   . Bladder cancer (HCC)    RECURRENT  . BPH (benign prostatic hypertrophy)   . CH[place]estive heart failure) ([place]   . Clot 2007    kidney biopsy done  . Complication of anesthesia    oxygen level drops when put to sleep , has some bleeding issues after every surgery with cautery required  . COPD [place] disease[place]   . Dyspnea    exertion; can not complete a flight of stairs without stopping to catch his breath   . Full dentures   . GERD (gastroesophageal reflux disease)   . History of blood transfusion last done 11-13-16   total of 7 units   . History of compression fracture of spine     04/2013   T12  . Hyperlipidemia   . Hypertension   . Nephritis 2007   oral chemo and prednisone done  . Pneumonia last 2016   x 2 in 2016  . PONV (postoperative nausea and vomiting)   . [place]    mild per 12-15-15 echo [facility]  . Renal mass    right  . Sleep apnea     Past Surgical History:  Procedure Laterality Date  . APPENDECTOMY  1985  . CARPAL TUNNEL RELEASE Right 02-05-2008  . CYSTOSCOPY W/ RETROGRADES Bilateral 10/29/2014   Procedure: CYSTOSCOPY WITH RETROGRADE PYELOGRAM;  Surgeon: TAlexis Frock MD;  [facility]  Service: Urology;  Laterality: Bilateral;  . CYSTOSCOPY W/ RETROGRADES Bilateral 09/10/2015   Procedure: CYSTOSCOPY WITH RETROGRADE PYELOGRAM;  Surgeon: TAlexis Frock MD;  [facility]  Service: Urology;  Laterality: Bilateral;  . CYSTOSCOPY WITH BIOPSY N/A 05/26/2014   Procedure: CYSTOSCOPY WITH BIOPSY;  Surgeon: DSharyn Creamer MD;  [facility]  Service: Urology;  Laterality: N/A;  . CYSTOSCOPY WITH RETROGRADE PYELOGRAM, URETEROSCOPY AND STENT PLACEMENT Bilateral 04/09/2014   Procedure: CYSTOSCOPY WITH RETROGRADE PYELOGRAM, POSSIBLE URETEROSCOPY WITH BIOPSY AND STENT PLACEMENT;  Surgeon: DSharyn Creamer MD;  [facility]  Service: Urology;  Laterality: Bilateral;  .  CYSTOSCOPY WITH RETROGRADE PYELOGRAM, URETEROSCOPY AND STENT PLACEMENT Right 02/01/2017   Procedure: CYSTOSCOPY WITH RIGHT  RETROGRADE PYELOGRAM  AND RIGHT STENT PLACEMENT;  Surgeon: Alexis Frock, MD;  [facility];  Service: Urology;  Laterality: Right;  . CYSTOSCOPY/RETROGRADE/URETEROSCOPY Right 12/28/2016   Procedure: CYSTOSCOPY/RETROGRADE/URETEROSCOPY WITH BIOPSY right renal pelvis insertion double j stent;  Surgeon: Alexis Frock, MD;  [facility];  Service: Urology;  Laterality: Right;  . KNEE LIGAMENT RECONSTRUCTION Right 1970  . RIGHT SHOULDER SURGERY   2011  . ROBOT ASSITED LAPAROSCOPIC NEPHROURETERECTOMY Right 02/01/2017   Procedure: XI ROBOT ASSITED LAPAROSCOPIC NEPHROURETERECTOMY;  Surgeon: Alexis Frock, MD;  [facility];  Service: Urology;  Laterality: Right;  . TONSILLECTOMY AND ADENOIDECTOMY  CHILD   and adenoids  . TRANSURETHRAL RESECTION OF BLADDER TUMOR WITH GYRUS (TURBT-GYRUS) Bilateral 04/09/2014   Procedure: TRANSURETHRAL RESECTION OF BLADDER TUMOR WITH GYRUS (TURBT-GYRUS);  Surgeon: Sharyn Creamer, MD;  Location[facility];  Service: Urology;  Laterality: Bilateral;  . TRANSURETHRAL RESECTION OF BLADDER TUMOR WITH GYRUS (TURBT-GYRUS) N/A 10/29/2014   Procedure: TRANSURETHRAL RESECTION OF BLADDER TUMOR WITH GYRUS (TURBT-GYRUS);  Surgeon: Alexis Frock, MD;  Location[facility];  Service: Urology;  Laterality: N/A;  . TRANSURETHRAL RESECTION OF BLADDER TUMOR WITH GYRUS (TURBT-GYRUS) N/A 09/10/2015   Procedure: TRANSURETHRAL RESECTION OF BLADDER TUMOR ;  Surgeon: Alexis Frock, MD;  Location: [facility];  Service: Urology;  Laterality: N/A;  . TRANSURETHRAL RESECTION OF PROSTATE  2006   AND    POST CAURTERIZATION OF BLEEDERS  . TRANSURETHRAL RESECTION OF PROSTATE N/A 09/10/2015   Procedure: TRANSURETHRAL RESECTION OF THE PROSTATE ;  Surgeon: Alexis Frock, MD;  Location: [facility];  Service: Urology;  Laterality: N/A;    Current Hospital Medications:  Home Meds:  Current Meds  Medication Sig  . acetaminophen (TYLENOL) 650 MG CR tablet Take 1,300 mg by mouth every 8 (eight) hours as needed for pain.  Marland Kitchen albuterol (PROVENTIL HFA;VENTOLIN HFA) 108 (90 Base) MCG/ACT inhaler Inhale 1-2 puffs into the lungs every 6 (six) hours as needed for wheezing or shortness of breath.  . allopurinol (ZYLOPRIM) 100 MG tablet Take 100 mg by mouth daily.   . calcium-vitamin D (OSCAL WITH D) 500-200 MG-UNIT tablet Take 1 tablet by mouth daily with breakfast.  . ferrous  sulfate 325 (65 FE) MG tablet Take 1 tablet (325 mg total) by mouth 3 (three) times daily with meals. (Patient taking differently: Take 325 mg by mouth 2 (two) times daily with a meal. )  . furosemide (LASIX) 40 MG tablet Take 40 mg by mouth 2 (two) times daily.  Marland Kitchen HYDROcodone-acetaminophen (NORCO) 5-325 MG tablet Take 1-2 tablets by mouth every 6 (six) hours as needed for moderate pain or severe pain.  . metoprolol (LOPRESSOR) 50 MG tablet Take 50 mg by mouth daily.  Marland Kitchen omeprazole (PRILOSEC) 20 MG capsule Take 20 mg by mouth daily.   . polyethylene glycol (MIRALAX / GLYCOLAX) packet Take 17 g by mouth daily.  . potassium chloride SA (K-DUR,KLOR-CON) 20 MEQ tablet Take 20 mEq by mouth daily.  . pravastatin (PRAVACHOL) 80 MG tablet Take 80 mg by mouth daily.     Scheduled Meds: Continuous Infusions: PRN Meds:.lidocaine  Allergies:  Allergies  Allergen Reactions  . Bee Venom Anaphylaxis  . Penicillins Anaphylaxis, Hives and Other (See Comments)    Has patient had a PCN reaction causing immediate rash, facial/tongue/throat swelling, SOB or lightheadedness with hypotension: Yes Has patient had  a PCN reaction causing severe rash involving mucus membranes or skin necrosis: Yes Has patient had a PCN reaction that required hospitalization No Has patient had a PCN reaction occurring within the last 10 years: No If all of the above answers are "NO", then may proceed with Cephalosporin use.   . Tramadol Other (See Comments)    Dizzy and loopy.     Family History  Problem Relation Age of Onset  . Congestive Heart Failure Mother   . Bladder Cancer Neg Hx     Social History:  reports that he quit smoking about 31 years ago. His smoking use included Cigarettes. He has a 52.50 pack-year smoking history. He has never used smokeless tobacco. He reports that he drinks about 2.4 oz of alcohol per week . He reports that he does not use drugs.  ROS: A complete review of systems was performed.  All  systems are negative except for pertinent findings as noted.  Physical Exam:  Vital signs in last 24 hours: Temp:  [98.2 F (36.8 C)-98.3 F (36.8 C)] 98.2 F (36.8 C) (05/01 1016) Pulse Rate:  [92-114] 92 (05/01 1016) Resp:  [18-20] 18 (05/01 1016) BP: (133-137)/(70-89) 137/89 (05/01 1016) SpO2:  [97 %-98 %] 98 % (05/01 1016) Weight:  [132.5 kg (292 lb)] 132.5 kg (292 lb) (05/01 8242) Constitutional:  Alert and oriented, No acute distress Cardiovascular: Regular rate and rhythm Respiratory: Normal respiratory effort GI: Abdomen is soft, nontender, nondistended, no abdominal masses; incisions healing well  GU: 5ffoley in place with no discharge; pink urine in bag; CBI going Lymphatic: No lymphadenopathy Neurologic: Grossly intact, no focal deficits Psychiatric: Normal mood and affect  Laboratory Data:   Recent Labs  03/28/17 0835  WBC 5.5  HGB 9.7*  HCT 30.1*  PLT 158     Recent Labs  03/28/17 0835  NA 134*  K 4.1  CL 101  GLUCOSE 141*  BUN 17  CALCIUM 9.6  CREATININE 0.91     Results for orders placed or performed during the hospital encounter of 03/28/17 (from the past 24 hour(s))  Urinalysis, Routine w reflex microscopic- may I&O cath if menses     Status: Abnormal   Collection Time: 03/28/17  8:09 AM  Result Value Ref Range   Color, Urine YELLOW YELLOW   APPearance CLEAR CLEAR   Specific Gravity, Urine 1.009 1.005 - 1.030   pH 6.0 5.0 - 8.0   Glucose, UA NEGATIVE NEGATIVE mg/dL   Hgb urine dipstick LARGE (A) NEGATIVE   Bilirubin Urine NEGATIVE NEGATIVE   Ketones, ur NEGATIVE NEGATIVE mg/dL   Protein, ur 100 (A) NEGATIVE mg/dL   Nitrite NEGATIVE NEGATIVE   Leukocytes, UA NEGATIVE NEGATIVE   RBC / HPF TOO NUMEROUS TO COUNT 0 - 5 RBC/hpf   WBC, UA 0-5 0 - 5 WBC/hpf   Bacteria, UA FEW (A) NONE SEEN   Squamous Epithelial / LPF NONE SEEN NONE SEEN  Basic metabolic panel     Status: Abnormal   Collection Time: 03/28/17  8:35 AM  Result Value Ref  Range   Sodium 134 (L) 135 - 145 mmol/L   Potassium 4.1 3.5 - 5.1 mmol/L   Chloride 101 101 - 111 mmol/L   CO2 26 22 - 32 mmol/L   Glucose, Bld 141 (H) 65 - 99 mg/dL   BUN 17 6 - 20 mg/dL   Creatinine, Ser 0.91 0.61 - 1.24 mg/dL   Calcium 9.6 8.9 - 10.3 mg/dL   GFR calc non  Af Amer >60 >60 mL/min   GFR calc Af Amer >60 >60 mL/min   Anion gap 7 5 - 15  CBC with Differential     Status: Abnormal   Collection Time: 03/28/17  8:35 AM  Result Value Ref Range   WBC 5.5 4.0 - 10.5 K/uL   RBC 3.24 (L) 4.22 - 5.81 MIL/uL   Hemoglobin 9.7 (L) 13.0 - 17.0 g/dL   HCT 30.1 (L) 39.0 - 52.0 %   MCV 92.9 78.0 - 100.0 fL   MCH 29.9 26.0 - 34.0 pg   MCHC 32.2 30.0 - 36.0 g/dL   RDW 15.3 11.5 - 15.5 %   Platelets 158 150 - 400 K/uL   Neutrophils Relative % 70 %   Neutro Abs 3.9 1.7 - 7.7 K/uL   Lymphocytes Relative 14 %   Lymphs Abs 0.8 0.7 - 4.0 K/uL   Monocytes Relative 11 %   Monocytes Absolute 0.6 0.1 - 1.0 K/uL   Eosinophils Relative 5 %   Eosinophils Absolute 0.3 0.0 - 0.7 K/uL   Basophils Relative 0 %   Basophils Absolute 0.0 0.0 - 0.1 K/uL   No results found for this or any previous visit (from the past 240 hour(s)).  Renal Function:  Recent Labs  03/28/17 0835  CREATININE 0.91   Estimated Creatinine Clearance: 93.9 mL/min (by C-G formula based on SCr of 0.91 mg/dL).  Radiologic Imaging: No results found.   Impression/Recommendation Gross hematuria-- possible bleeding from prostate or bladder.  No UTI.  No indication for ABx at this time. Admit for observation and continue CBIs until output clear.  Will need cystoscopy which can be performed as an inpt or outpt per Dr. Zettie Pho discretion. Repeat labs in am.  AUR--due to clot retention.  Should resolve with cessation of bleeding as he had no prior voiding issues.    [place], [place] 03/28/2017, 12:24 PM

## 2017-03-28 NOTE — Care Management Obs Status (Signed)
[facility] NOTIFICATION   Patient Details  Name: TAL KEMPKER MRN: [id number] Date of Birth: 06/28/1935   Medicare Observation Status Notification Given:  Yes    MahabirJuliann Pulse, RN 03/28/2017, 3:59 PM

## 2017-03-28 NOTE — ED Notes (Addendum)
16 french catheter was placed per PA and urologist called and requested we put in 3-way catheter to flush for blood clots.     Will start irrigation.

## 2017-03-28 NOTE — H&P (Signed)
Please see consult note from earlier today.

## 2017-03-28 NOTE — Progress Notes (Signed)
Taking over care of patient. Agree with previous RN assessment. Denies any needs at this time. Will continue to monitor.  

## 2017-03-28 NOTE — ED Notes (Signed)
Pt currently does not meet criteria to have foley replaced in the Emergency Department per infection prevention since plan is to admit pt. Pt's foley draining red urine. Pt comfortable. Now urologist called back. Plan to irrigate foley and then possibly discharge from the ED.

## 2017-03-28 NOTE — ED Provider Notes (Signed)
[facility] DEPT Provider Note   CSN: [id number] Arrival date & time: 03/28/17  0607     History   Chief Complaint Chief Complaint  Patient presents with  . Urinary Retention    HPI Victor Wang is a 82 y.o. male.  HPI 82 year old Caucasian male past medical history significant for bladder cancer, BPH, right-sided nephrectomy in March 2018, CHF presents to the ED today with complaints of urinary retention. Patient states he had a right-sided nephrectomy on March 7 by Dr. Tammi Klippel. States that for the past week he's been having hematuria with clots after trying to get out of a recliner and straining. Saw urology yesterday who states that as long as he is passing the clots there is nothing further they would do for him. States that approximately 3 AM this morning patient's top passing clots was able to urinate. States that he has suprapubic fullness and feels like he needs to urinate.Patient denies any fever, chills, change in bowel habits, paresthesias, weakness, flank pain, nausea, or emesis. Past Medical History:  Diagnosis Date  . Anemia   . Arthritis   . Bladder cancer (HCC)    RECURRENT  . BPH (benign prostatic hypertrophy)   . CHF (congestive heart failure) ([place])   . Clot 2007    kidney biopsy done  . Complication of anesthesia    oxygen level drops when put to sleep , has some bleeding issues after every surgery with cautery required  . COPD (chronic obstructive pulmonary disease) ([place])   . Dyspnea    exertion; can not complete a flight of stairs without stopping to catch his breath   . Full dentures   . GERD (gastroesophageal reflux disease)   . History of blood transfusion last done 11-13-16   total of 7 units   . History of compression fracture of spine    04/2013   T12  . Hyperlipidemia   . Hypertension   . Nephritis 2007   oral chemo and prednisone done  . Pneumonia last 2016   x 2 in 2016  . PONV (postoperative nausea and vomiting)   . Pulmonary  hypertension ([place])    mild per 12-15-15 echo [facility]  . Renal mass    right  . Sleep apnea     Patient Active Problem List   Diagnosis Date Noted  . H/O nephroureterectomy 02/01/2017  . Cancer of renal pelvis, right ([place]) 01/31/2017  . CHF (congestive heart failure) ([place]) 01/01/2017  . Acute blood loss anemia 01/01/2017  . Abdominal pain 01/01/2017  . Hypotension 01/01/2017  . OSA (obstructive sleep apnea) 01/01/2017  . Essential hypertension 01/01/2017  . Bladder cancer ([place]) 09/10/2015    Past Surgical History:  Procedure Laterality Date  . APPENDECTOMY  1985  . CARPAL TUNNEL RELEASE Right 02-05-2008  . CYSTOSCOPY W/ RETROGRADES Bilateral 10/29/2014   Procedure: CYSTOSCOPY WITH RETROGRADE PYELOGRAM;  Surgeon: Alexis Frock, MD;  Location: [facility];  Service: Urology;  Laterality: Bilateral;  . CYSTOSCOPY W/ RETROGRADES Bilateral 09/10/2015   Procedure: CYSTOSCOPY WITH RETROGRADE PYELOGRAM;  Surgeon: Alexis Frock, MD;  [facility];  Service: Urology;  Laterality: Bilateral;  . CYSTOSCOPY WITH BIOPSY N/A 05/26/2014   Procedure: CYSTOSCOPY WITH BIOPSY;  Surgeon: Sharyn Creamer, MD;  Location[facility];  Service: Urology;  Laterality: N/A;  . CYSTOSCOPY WITH RETROGRADE PYELOGRAM, URETEROSCOPY AND STENT PLACEMENT Bilateral 04/09/2014   Procedure: CYSTOSCOPY WITH RETROGRADE PYELOGRAM, POSSIBLE URETEROSCOPY WITH BIOPSY AND STENT PLACEMENT;  Surgeon:  Sharyn Creamer, MD;  Location: [facility];  Service: Urology;  Laterality: Bilateral;  . CYSTOSCOPY WITH RETROGRADE PYELOGRAM, URETEROSCOPY AND STENT PLACEMENT Right 02/01/2017   Procedure: CYSTOSCOPY WITH RIGHT  RETROGRADE PYELOGRAM  AND RIGHT STENT PLACEMENT;  Surgeon: Alexis Frock, MD;  [facility];  Service: Urology;  Laterality: Right;  . CYSTOSCOPY/RETROGRADE/URETEROSCOPY Right 12/28/2016   Procedure: CYSTOSCOPY/RETROGRADE/URETEROSCOPY WITH  BIOPSY right renal pelvis insertion double j stent;  Surgeon: Alexis Frock, MD;  [facility];  Service: Urology;  Laterality: Right;  . KNEE LIGAMENT RECONSTRUCTION Right 1970  . RIGHT SHOULDER SURGERY  2011  . ROBOT ASSITED LAPAROSCOPIC NEPHROURETERECTOMY Right 02/01/2017   Procedure: XI ROBOT ASSITED LAPAROSCOPIC NEPHROURETERECTOMY;  Surgeon: Alexis Frock, MD;  [facility];  Service: Urology;  Laterality: Right;  . TONSILLECTOMY AND ADENOIDECTOMY  CHILD   and adenoids  . TRANSURETHRAL RESECTION OF BLADDER TUMOR WITH GYRUS (TURBT-GYRUS) Bilateral 04/09/2014   Procedure: TRANSURETHRAL RESECTION OF BLADDER TUMOR WITH GYRUS (TURBT-GYRUS);  Surgeon: Sharyn Creamer, MD;  Location: [facility];  Service: Urology;  Laterality: Bilateral;  . TRANSURETHRAL RESECTION OF BLADDER TUMOR WITH GYRUS (TURBT-GYRUS) N/A 10/29/2014   Procedure: TRANSURETHRAL RESECTION OF BLADDER TUMOR WITH GYRUS (TURBT-GYRUS);  Surgeon: Alexis Frock, MD;  Location: [facility];  Service: Urology;  Laterality: N/A;  . TRANSURETHRAL RESECTION OF BLADDER TUMOR WITH GYRUS (TURBT-GYRUS) N/A 09/10/2015   Procedure: TRANSURETHRAL RESECTION OF BLADDER TUMOR ;  Surgeon: Alexis Frock, MD;  Location: [facility];  Service: Urology;  Laterality: N/A;  . TRANSURETHRAL RESECTION OF PROSTATE  2006   AND    POST CAURTERIZATION OF BLEEDERS  . TRANSURETHRAL RESECTION OF PROSTATE N/A 09/10/2015   Procedure: TRANSURETHRAL RESECTION OF THE PROSTATE ;  Surgeon: Alexis Frock, MD;  Location: [facility];  Service: Urology;  Laterality: N/A;       Home Medications    Prior to Admission medications   Medication Sig Start Date End Date Taking? Authorizing Provider  acetaminophen (TYLENOL) 500 MG tablet Take 1,000 mg by mouth every 6 (six) hours as needed for moderate pain.    Historical Provider, MD  albuterol (PROVENTIL HFA;VENTOLIN HFA) 108 (90 Base) MCG/ACT inhaler  Inhale 1-2 puffs into the lungs every 6 (six) hours as needed for wheezing or shortness of breath.    Historical Provider, MD  allopurinol (ZYLOPRIM) 100 MG tablet Take 100 mg by mouth daily.     Historical Provider, MD  ferrous sulfate 325 (65 FE) MG tablet Take 1 tablet (325 mg total) by mouth 3 (three) times daily with meals. 01/04/17   Janece Canterbury, MD  furosemide (LASIX) 40 MG tablet Take 40 mg by mouth 2 (two) times daily.    Historical Provider, MD  HYDROcodone-acetaminophen (NORCO) 5-325 MG tablet Take 1-2 tablets by mouth every 6 (six) hours as needed for moderate pain or severe pain. 02/01/17   Debbrah Alar, PA-C  ipratropium-albuterol (DUONEB) 0.5-2.5 (3) MG/3ML SOLN Take 3 mLs by nebulization every 6 (six) hours as needed (for wheezing/shortness of breath).    Historical Provider, MD  metoprolol (LOPRESSOR) 50 MG tablet Take 50 mg by mouth daily.    Historical Provider, MD  omeprazole (PRILOSEC) 20 MG capsule Take 20 mg by mouth daily.     Historical Provider, MD  polyethylene glycol (MIRALAX / GLYCOLAX) packet Take 17 g by mouth daily. 01/05/17   Janece Canterbury, MD  potassium chloride SA (K-DUR,KLOR-CON) 20 MEQ tablet Take 20 mEq by mouth daily.  Historical Provider, MD  pravastatin (PRAVACHOL) 80 MG tablet Take 80 mg by mouth daily.     Historical Provider, MD    Family History Family History  Problem Relation Age of Onset  . Congestive Heart Failure Mother   . Bladder Cancer Neg Hx     Social History Social History  Substance Use Topics  . Smoking status: Former Smoker    Packs/day: 1.50    Years: 35.00    Types: Cigarettes    Quit date: 11/28/1985  . Smokeless tobacco: Never Used  . Alcohol use 2.4 oz/week    4 Standard drinks or equivalent per week     Comment: seldom     Allergies   Bee venom; Penicillins; and Tramadol   Review of Systems Review of Systems  Constitutional: Negative for chills and fever.  HENT: Negative for congestion.   Eyes: Negative for  visual disturbance.  Respiratory: Negative for cough.   Gastrointestinal: Negative for abdominal pain, diarrhea, nausea and vomiting.  Genitourinary: Positive for decreased urine volume and hematuria. Negative for dysuria, frequency, testicular pain and urgency.  Neurological: Negative for dizziness, syncope, weakness, light-headedness, numbness and headaches.     Physical Exam Updated Vital Signs BP 133/70 (BP Location: Right Arm)   Pulse (!) 114   Temp 98.3 F (36.8 C) (Oral)   Resp 20   Ht '6\' 1"'$  (1.854 m)   Wt 132.5 kg   SpO2 97%   BMI 38.52 kg/m   Physical Exam  Constitutional: He is oriented to person, place, and time. He appears well-developed and well-nourished. No distress.  HENT:  Head: Normocephalic and atraumatic.  Mouth/Throat: Oropharynx is clear and moist.  Eyes: Conjunctivae are normal. Right eye exhibits no discharge. Left eye exhibits no discharge. No scleral icterus.  Neck: Normal range of motion. Neck supple. No thyromegaly present.  Cardiovascular: Normal rate, regular rhythm, normal heart sounds and intact distal pulses.   Pulmonary/Chest: Effort normal and breath sounds normal.  Abdominal: Soft. Bowel sounds are normal. He exhibits no distension. There is no tenderness. There is no rebound and no guarding.  Musculoskeletal: Normal range of motion.  No flank pain. No midline L-spine or T-spine tenderness. No deformity or step off noted  Lymphadenopathy:    He has no cervical adenopathy.  Neurological: He is alert and oriented to person, place, and time.  Strength 5 out of 5 in lower extremities.  Skin: Skin is warm and dry. Capillary refill takes less than 2 seconds.  Nursing note and vitals reviewed.    ED Treatments / Results  Labs (all labs ordered are listed, but only abnormal results are displayed) Labs Reviewed  URINALYSIS, ROUTINE W REFLEX MICROSCOPIC    EKG  EKG Interpretation None       Radiology No results  found.  Procedures Procedures (including critical care time)  Medications Ordered in ED Medications  lidocaine (XYLOCAINE) 2 % jelly 1 application (1 application Urethral Given 03/28/17 0759)     Initial Impression / Assessment and Plan / ED Course  I have reviewed the triage vital signs and the nursing notes.  Pertinent labs & imaging results that were available during my care of the patient were reviewed by me and considered in my medical decision making (see chart for details).     Pt presents to the ED with gross hematuria, blood clots and urinary retention. Bladder scan revealed 429 cc of urine. 16 french foley placed with immediate return of gross dark red hematuria. No clots  noted. Pt feels much relief. Ua show no signs of infection.  Spoke with Dr. Louis Meckel with urology consulted. He would like 2 french 3 way foley placed with CBI. No leukocytosis. Creatine normal. hgb stable and at baseline. Pt  Vs are stable. Will admit to urology for obs. Pt started on prophylaxis abx per urology request. Hemodynamically stable and pt updated on plan of care.   Final Clinical Impressions(s) / ED Diagnoses   Final diagnoses:  Urinary retention  Hematuria, unspecified type    New Prescriptions Current Discharge Medication List       Aaron Edelman 03/29/17 2133    Nat Christen, MD 04/01/17 [id number]

## 2017-03-29 ENCOUNTER — Inpatient Hospital Stay (HOSPITAL_COMMUNITY): Payer: Medicare Other | Admitting: Anesthesiology

## 2017-03-29 ENCOUNTER — Inpatient Hospital Stay (HOSPITAL_COMMUNITY): Payer: Medicare Other

## 2017-03-29 ENCOUNTER — Encounter (HOSPITAL_COMMUNITY)
Admission: EM | Disposition: A | Discharge status: Home Health | Payer: Self-pay | Source: Home / Self Care | Attending: Urology

## 2017-03-29 DIAGNOSIS — J449 Chronic obstructive pulmonary disease, unspecified: Secondary | ICD-10-CM | POA: Diagnosis present

## 2017-03-29 DIAGNOSIS — G4730 Sleep apnea, unspecified: Secondary | ICD-10-CM | POA: Diagnosis present

## 2017-03-29 DIAGNOSIS — C669 Malignant neoplasm of unspecified ureter: Secondary | ICD-10-CM | POA: Diagnosis not present

## 2017-03-29 DIAGNOSIS — I130 Hypertensive heart and chronic kidney disease with heart failure and stage 1 through stage 4 chronic kidney disease, or unspecified chronic kidney disease: Secondary | ICD-10-CM | POA: Diagnosis present

## 2017-03-29 DIAGNOSIS — E785 Hyperlipidemia, unspecified: Secondary | ICD-10-CM | POA: Diagnosis not present

## 2017-03-29 DIAGNOSIS — K219 Gastro-esophageal reflux disease without esophagitis: Secondary | ICD-10-CM | POA: Diagnosis present

## 2017-03-29 DIAGNOSIS — R310 Gross hematuria: Secondary | ICD-10-CM | POA: Diagnosis present

## 2017-03-29 DIAGNOSIS — D494 Neoplasm of unspecified behavior of bladder: Secondary | ICD-10-CM | POA: Diagnosis not present

## 2017-03-29 DIAGNOSIS — N3289 Other specified disorders of bladder: Secondary | ICD-10-CM | POA: Diagnosis not present

## 2017-03-29 DIAGNOSIS — Z8551 Personal history of malignant neoplasm of bladder: Secondary | ICD-10-CM | POA: Diagnosis not present

## 2017-03-29 DIAGNOSIS — C675 Malignant neoplasm of bladder neck: Secondary | ICD-10-CM | POA: Diagnosis not present

## 2017-03-29 DIAGNOSIS — C651 Malignant neoplasm of right renal pelvis: Secondary | ICD-10-CM | POA: Diagnosis not present

## 2017-03-29 DIAGNOSIS — N183 Chronic kidney disease, stage 3 (moderate): Secondary | ICD-10-CM | POA: Diagnosis present

## 2017-03-29 DIAGNOSIS — R338 Other retention of urine: Secondary | ICD-10-CM | POA: Diagnosis not present

## 2017-03-29 DIAGNOSIS — Z87891 Personal history of nicotine dependence: Secondary | ICD-10-CM | POA: Diagnosis not present

## 2017-03-29 DIAGNOSIS — Z8249 Family history of ischemic heart disease and other diseases of the circulatory system: Secondary | ICD-10-CM | POA: Diagnosis not present

## 2017-03-29 DIAGNOSIS — I110 Hypertensive heart disease with heart failure: Secondary | ICD-10-CM | POA: Diagnosis not present

## 2017-03-29 DIAGNOSIS — Z6838 Body mass index (BMI) 38.0-38.9, adult: Secondary | ICD-10-CM | POA: Diagnosis not present

## 2017-03-29 DIAGNOSIS — I509 Heart failure, unspecified: Secondary | ICD-10-CM | POA: Diagnosis present

## 2017-03-29 DIAGNOSIS — R339 Retention of urine, unspecified: Secondary | ICD-10-CM | POA: Diagnosis not present

## 2017-03-29 DIAGNOSIS — N401 Enlarged prostate with lower urinary tract symptoms: Secondary | ICD-10-CM | POA: Diagnosis not present

## 2017-03-29 DIAGNOSIS — Z905 Acquired absence of kidney: Secondary | ICD-10-CM | POA: Diagnosis not present

## 2017-03-29 DIAGNOSIS — C679 Malignant neoplasm of bladder, unspecified: Secondary | ICD-10-CM | POA: Diagnosis not present

## 2017-03-29 HISTORY — PX: CYSTOSCOPY W/ URETERAL STENT PLACEMENT: SHX1429

## 2017-03-29 LAB — BASIC METABOLIC PANEL
ANION GAP: 8 (ref 5–15)
BUN: 17 mg/dL (ref 6–20)
CALCIUM: 9.4 mg/dL (ref 8.9–10.3)
CO2: 27 mmol/L (ref 22–32)
Chloride: 98 mmol/L — ABNORMAL LOW (ref 101–111)
Creatinine, Ser: 0.89 mg/dL (ref 0.61–1.24)
GFR calc Af Amer: >60 mL/min (ref >60)
GLUCOSE: 115 mg/dL — AB (ref 65–99)
POTASSIUM: 4.2 mmol/L (ref 3.5–5.1)
SODIUM: 133 mmol/L — AB (ref 135–145)

## 2017-03-29 LAB — CBC
HCT: 28.4 % — ABNORMAL LOW (ref 39.0–52.0)
Hemoglobin: 9 g/dL — ABNORMAL LOW (ref 13.0–17.0)
MCH: 29.6 pg (ref 26.0–34.0)
MCHC: 31.7 g/dL (ref 30.0–36.0)
MCV: 93.4 fL (ref 78.0–100.0)
PLATELETS: 153 10*3/uL (ref 150–400)
RBC: 3.04 MIL/uL — AB (ref 4.22–5.81)
RDW: 15.4 % (ref 11.5–15.5)
WBC: 4.6 10*3/uL (ref 4.0–10.5)

## 2017-03-29 LAB — SURGICAL PCR SCREEN
MRSA, PCR: NEGATIVE
Staphylococcus aureus: NEGATIVE

## 2017-03-29 SURGERY — CYSTOSCOPY, WITH RETROGRADE PYELOGRAM AND URETERAL STENT INSERTION
Anesthesia: General | Site: Bladder

## 2017-03-29 MED ORDER — FENTANYL CITRATE (PF) 100 MCG/2ML IJ SOLN
INTRAMUSCULAR | Status: DC | PRN
  Administered 2017-03-29 (×2): 25 ug via INTRAVENOUS
  Administered 2017-03-29: 50 ug via INTRAVENOUS

## 2017-03-29 MED ORDER — LACTATED RINGERS IV SOLN
INTRAVENOUS | Status: DC
  Administered 2017-03-29: 17:00:00 via INTRAVENOUS

## 2017-03-29 MED ORDER — FENTANYL CITRATE (PF) 100 MCG/2ML IJ SOLN
25.0000 ug | INTRAMUSCULAR | Status: DC | PRN
  Administered 2017-03-29 (×3): 50 ug via INTRAVENOUS

## 2017-03-29 MED ORDER — FENTANYL CITRATE (PF) 100 MCG/2ML IJ SOLN
INTRAMUSCULAR | Status: AC
  Filled 2017-03-29: qty 2

## 2017-03-29 MED ORDER — PROPOFOL 10 MG/ML IV BOLUS
INTRAVENOUS | Status: AC
  Filled 2017-03-29: qty 20

## 2017-03-29 MED ORDER — CIPROFLOXACIN IN D5W 400 MG/200ML IV SOLN
INTRAVENOUS | Status: AC
Start: 2017-03-29 — End: 2017-03-29
  Filled 2017-03-29: qty 200

## 2017-03-29 MED ORDER — PRAVASTATIN SODIUM 40 MG PO TABS
80.0000 mg | ORAL_TABLET | Freq: Every day | ORAL | Status: DC
  Administered 2017-03-29 – 2017-03-30 (×2): 80 mg via ORAL
  Filled 2017-03-29 (×2): qty 2

## 2017-03-29 MED ORDER — ONDANSETRON HCL 4 MG/2ML IJ SOLN
INTRAMUSCULAR | Status: AC
Start: 2017-03-29 — End: 2017-03-29
  Filled 2017-03-29: qty 2

## 2017-03-29 MED ORDER — LIDOCAINE 2% (20 MG/ML) 5 ML SYRINGE
INTRAMUSCULAR | Status: DC | PRN
  Administered 2017-03-29: 100 mg via INTRAVENOUS

## 2017-03-29 MED ORDER — ALLOPURINOL 100 MG PO TABS
100.0000 mg | ORAL_TABLET | Freq: Every day | ORAL | Status: DC
  Administered 2017-03-29 – 2017-03-30 (×2): 100 mg via ORAL
  Filled 2017-03-29 (×2): qty 1

## 2017-03-29 MED ORDER — SODIUM CHLORIDE 0.9 % IR SOLN
Status: DC | PRN
  Administered 2017-03-29: 6000 mL

## 2017-03-29 MED ORDER — 0.9 % SODIUM CHLORIDE (POUR BTL) OPTIME
TOPICAL | Status: DC | PRN
  Administered 2017-03-29: 1000 mL

## 2017-03-29 MED ORDER — PROPOFOL 10 MG/ML IV BOLUS
INTRAVENOUS | Status: DC | PRN
  Administered 2017-03-29: 160 mg via INTRAVENOUS

## 2017-03-29 MED ORDER — CIPROFLOXACIN IN D5W 400 MG/200ML IV SOLN
400.0000 mg | INTRAVENOUS | Status: AC
  Administered 2017-03-29: 400 mg via INTRAVENOUS

## 2017-03-29 MED ORDER — VITAMINS A & D EX OINT
TOPICAL_OINTMENT | CUTANEOUS | Status: AC
  Administered 2017-03-29: 21:00:00
  Filled 2017-03-29: qty 5

## 2017-03-29 MED ORDER — FUROSEMIDE 40 MG PO TABS
40.0000 mg | ORAL_TABLET | Freq: Two times a day (BID) | ORAL | Status: DC
  Administered 2017-03-30: 40 mg via ORAL
  Filled 2017-03-29: qty 1

## 2017-03-29 MED ORDER — POTASSIUM CHLORIDE CRYS ER 20 MEQ PO TBCR
20.0000 meq | EXTENDED_RELEASE_TABLET | Freq: Every day | ORAL | Status: DC
  Administered 2017-03-29 – 2017-03-30 (×2): 20 meq via ORAL
  Filled 2017-03-29 (×2): qty 1

## 2017-03-29 MED ORDER — FENTANYL CITRATE (PF) 100 MCG/2ML IJ SOLN
INTRAMUSCULAR | Status: AC
  Administered 2017-03-29: 50 ug via INTRAVENOUS
  Filled 2017-03-29: qty 2

## 2017-03-29 MED ORDER — LIDOCAINE 2% (20 MG/ML) 5 ML SYRINGE
INTRAMUSCULAR | Status: AC
  Filled 2017-03-29: qty 5

## 2017-03-29 MED ORDER — PANTOPRAZOLE SODIUM 40 MG PO TBEC
40.0000 mg | DELAYED_RELEASE_TABLET | Freq: Every day | ORAL | Status: DC
  Administered 2017-03-29 – 2017-03-30 (×2): 40 mg via ORAL
  Filled 2017-03-29 (×2): qty 1

## 2017-03-29 MED ORDER — ONDANSETRON HCL 4 MG/2ML IJ SOLN
INTRAMUSCULAR | Status: DC | PRN
  Administered 2017-03-29: 4 mg via INTRAVENOUS

## 2017-03-29 SURGICAL SUPPLY — 16 items
BAG URINE DRAINAGE (UROLOGICAL SUPPLIES) ×2 IMPLANT
BAG URO CATCHER STRL LF (MISCELLANEOUS) ×3 IMPLANT
BASKET ZERO TIP NITINOL 2.4FR (BASKET) IMPLANT
BSKT STON RTRVL ZERO TP 2.4FR (BASKET)
CATH HEMATURIA 20FR (CATHETERS) ×2 IMPLANT
CATH INTERMIT  6FR 70CM (CATHETERS) ×2 IMPLANT
CLOTH BEACON ORANGE TIMEOUT ST (SAFETY) ×3 IMPLANT
COVER SURGICAL LIGHT HANDLE (MISCELLANEOUS) ×3 IMPLANT
GLOVE BIOGEL M STRL SZ7.5 (GLOVE) ×3 IMPLANT
GOWN STRL REUS W/TWL LRG LVL3 (GOWN DISPOSABLE) ×6 IMPLANT
GUIDEWIRE ANG ZIPWIRE 038X150 (WIRE) IMPLANT
GUIDEWIRE STR DUAL SENSOR (WIRE) ×3 IMPLANT
MANIFOLD NEPTUNE II (INSTRUMENTS) ×3 IMPLANT
PACK CYSTO (CUSTOM PROCEDURE TRAY) ×3 IMPLANT
TUBING CONNECTING 10 (TUBING) ×2 IMPLANT
TUBING CONNECTING 10' (TUBING) ×1

## 2017-03-29 NOTE — Care Management Note (Signed)
Case Management Note  Patient Details  Name: Victor Wang MRN: [id number] Date of Birth: 04/24/1935  Subjective/Objective:81 y/o m admitted w/urinary retention. From home. Active w/[facility]-rep Kendra aware of HHRN-meds, disease mgmnt-fax HHC orders to fax#[phone]. Has home 02 continuous,has travel tank.Cystoscopy today.                    Action/Plan:d/c plan home w/HHC.   Expected Discharge Date:   (unknown)               Expected Discharge Plan:  [place]  In-House Referral:     Discharge planning Services     Post Acute Care Choice:  [place] (home 02 continuous,has travel tank, Active w/[facility]0HHRN-meds,v/s.) Choice offered to:     DME Arranged:    DME Agency:     HH Arranged:    [place] Agency:  [place]  Status of Service:  In process, will continue to follow  If discussed at Long Length of Stay Meetings, dates discussed:    Additional Comments:  [name], RN 03/29/2017, 12:25 PM

## 2017-03-29 NOTE — Progress Notes (Signed)
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 Most recent Cr 1-1.4's. Now s/p RIGHT nephrectomy 01/2017.   3-Gross Hematuria -new and persistant gross hematuria after fall around 03/23/17. H/o bladder cancer and Rt renal cancer as per above. INitially managed in bladder irrigation and admission 03/28/17.  Today "Victor Wang" is still having significant gross hematuria, requried clot irrigation overnight. No interval fevers.    Objective: Vital signs in last 24 hours: Temp:  [97.8 F (36.6 C)-98.8 F (37.1 C)] 98.8 F (37.1 C) (05/02 1405) Pulse Rate:  [91-107] 91 (05/02 1405) Resp:  [17-20] 17 (05/02 1405) BP: (105-152)/(63-91) 105/63 (05/02 1405) SpO2:  [97 %-98 %] 98 % (05/02 1405) Last BM Date: 03/27/17  Intake/Output from previous day: 05/01 0701 - 05/02 0700 In: 18010  Out: 25825 [Urine:25825] Intake/Output this shift: Total I/O In: -  Out: 2100 [Urine:2100]  General appearance: alert, cooperative and at baseline Eyes: negative Nose: Nares normal. Septum midline. Mucosa normal. No drainage or sinus tenderness. Throat: lips, mucosa, and tongue normal; teeth and gums normal Neck: supple, symmetrical, trachea midline Back: symmetric, no curvature. ROM normal. No CVA tenderness. Resp: non-labored on [place] O2 Cardio: Nl rate, mild LE edema as per baseline.  GI: soft, non-tender; bowel sounds normal; no masses,  no organomegaly Male genitalia: 3 way foley in place with light red urine on irrigation, no clots at present.  Extremities: extremities normal, atraumatic, no cyanosis or edema Pulses: 2+ and symmetric Lymph nodes: Cervical, supraclavicular, and axillary nodes normal. Neurologic: Grossly normal  Lab  Results:   Recent Labs  03/28/17 0835 03/29/17 0535  WBC 5.5 4.6  HGB 9.7* 9.0*  HCT 30.1* 28.4*  PLT 158 153   BMET  Recent Labs  03/28/17 0835 03/29/17 0535  NA 134* 133*  K 4.1 4.2  CL 101 98*  CO2 26 27  GLUCOSE 141* 115*  BUN 17 17  CREATININE 0.91 0.89  CALCIUM 9.6 9.4   PT/INR No results for input(s): LABPROT, INR in the last 72 hours. ABG No results for input(s): PHART, HCO3 in the last 72 hours.  Invalid input(s): PCO2, PO2  Studies/Results: No results found.  Anti-infectives: Anti-infectives    Start     Dose/Rate Route Frequency Ordered Stop   03/28/17 1045  ciprofloxacin (CIPRO) tablet 500 mg     500 mg Oral  Once 03/28/17 1038 03/28/17 1044      Assessment/Plan:  Rec proceed with cysto / clot evacuation / fulgeration left retorgrade today with goal of ruling out recurrent cancer and treating any bleeding lesions / vessals as he is now refracotry to outpatient management. Risks, benefits, alternatives, expected peri-op course discussed and he desires to proceed.   [facility], Conall Vangorder 03/29/2017

## 2017-03-29 NOTE — Progress Notes (Signed)
Patient called out complaining of increased pain and pressure in bladder around 20:45. CBI has stopped running and no flow from foley cath. Patient had to be hand irrigated several times and several moderate size clots were able to be removed. Patient given prn pain medicine to help alleviate discomfort and CBI rate increased. Will continue to monitor patient.

## 2017-03-29 NOTE — Progress Notes (Signed)
Patient clotted off again. Had to be hand irrigated. Several large clots removed. Will continue to monitor patient.

## 2017-03-29 NOTE — Op Note (Signed)
Victor Wang, Victor Wang NO.:  [id number]  MEDICAL RECORD NO.:  [id number]  LOCATION:  0626                         FACILITY:  [facility]  PHYSICIAN:  Alexis Frock, MD     DATE OF BIRTH:  [birth year]  DATE OF PROCEDURE: 03/29/2017                              OPERATIVE REPORT   PREOPERATIVE DIAGNOSES:  Recurrent clot retention, history of bladder cancer.  POSTOPERATIVE DIAGNOSES:  Recurrent clot retention, history of bladder cancer, small-volume bladder cancer recurrence.  PROCEDURES: 1. Cystoscopy with clot evacuation and fulguration of bleeders. 2. Transurethral resection of bladder tumor, volume medium. 3. Left retrograde pyelogram with interpretation.  ESTIMATED BLOOD LOSS:  Approximately 150 mL of old formed clot, negligible new blood.  FINDINGS: 1. Moderate-volume intravesical clot that was formed, this easily     irrigated and set aside. 2. Posterior bladder wall erythema likely catheter irritation. 3. Shaggy papillary changes of prostatic mucosa likely early bladder     tumor recurrence, this was completely resected. 4. Complete resolution of all visible tumor or active bleeding     following clot evacuation, fulguration, and transurethral resection     of bladder tumor. 5. Unremarkable left retrograde pyelogram.  INDICATION:  Victor Wang is an unfortunate, but very pleasant 82 year old gentleman with history of multifocal urothelial carcinoma including stage IV of the right kidney, status post nephroureterectomy.  The patient also had bladder tumor as well and solitary left kidney.  He has had recent onset of gross hematuria following a fall.  He is not on blood thinners, felt initially to be traumatic in nature; however, his hematuria has persisted and progressed to clot retention.  This clearly felt that endoscopic evaluation would be warranted in the operative suite given his history of multifocal urothelial carcinoma both for diagnostic and  therapeutic intent.  Informed consent was obtained and placed in the medical record.  PROCEDURE IN DETAIL:  The patient being Victor Wang, was verified. Procedure being cysto, clot evac and fulguration of bleeders.  Left retrograde pyelogram was confirmed.  Procedure was carried out.  Time- out was performed.  Intravenous antibiotics were administered.  General LMA anesthesia was introduced.  The patient was placed into a low lithotomy position and sterile field was created by prepping and draping the patient's penis, perineum and proximal thighs using iodine.  Next, cystourethroscopy was performed using a 22-French rigid cystoscope with offset lens.  Inspection of the anterior and posterior urethra revealed some likely TURP defect at the prostatic urethra.  There was some shaggy mucosa with erythema likely consistent with early papillary tumor recurrence in the prostatic urethra.  Inspection of the urinary bladder revealed a moderate-volume clot within the urinary bladder.  Cystoscope was then exchanged for 26-French continuous flow resectoscope sheath with visual obturator and clot evacuation was performed with Toomey syringe, approximately 150 mL of old clot was removed.  Visualization following this revealed resolution of all large clot, there was only scant mucosal bleeding from the area of posterior wall erythema likely consistent with catheter irritation in the aforementioned shaggy mucosa in the prostatic urethra.  The patient with a solitary left ureteral orifice, which was then cannulated with a 6-French end-hole catheter and left retrograde pyelogram  was obtained.  Left retrograde pyelogram demonstrated a single left ureter with single- system left kidney.  No filling defects or narrowing noted.  Next, medium-sized resectoscope loop was used to provide fulguration current to the area of posterior wall erythema and then resection of the small area of prostatic urethral  mucosa, this was approximately 3 cm2. Photodocumentation performed.  These tissue chips were set aside for permanent pathology and the base of this was fulgurated, which resulted in excellent hemostasis.  Following these maneuvers, there was excellent hemostasis.  No evidence of perforation.  We achieved the goal of procedure today.  Resectoscope was then exchanged for a 22-French 3-way Foley catheter, 20 mL sterile water in the balloon.  This was connected to straight drain with the irrigation port plugged and procedure was terminated.  The patient tolerated the procedure well.  There were no immediate periprocedural complications.  The patient was taken to the postanesthesia care unit in stable condition with plan for overnight stay, hopeful discharge tomorrow.          ______________________________ Alexis Frock, MD     TM/MEDQ  D:  03/29/2017  T:  03/29/2017  Job:  [id number]

## 2017-03-29 NOTE — Progress Notes (Signed)
Back from OR.

## 2017-03-29 NOTE — Progress Notes (Signed)
TO OR. 

## 2017-03-29 NOTE — Transfer of Care (Signed)
Immediate Anesthesia Transfer of Care Note  Patient: Victor Wang  Procedure(s) Performed: Procedure(s): CYSTOSCOPY WITH CLOT EVACUATION transurethral resection bladder tumor retrograde pylegram left ureter (N/A)  Patient Location: PACU  Anesthesia Type:General  Level of Consciousness: awake, alert  and oriented  Airway & Oxygen Therapy: Patient Spontanous Breathing and Patient connected to nasal cannula oxygen  Post-op Assessment: Report given to RN and Post -op Vital signs reviewed and stable  Post vital signs: Reviewed and stable  Last Vitals:  Vitals:   03/29/17 0506 03/29/17 1405  BP: 121/65 105/63  Pulse: 91 91  Resp: 18 17  Temp: 36.8 C 37.1 C    Last Pain:  Vitals:   03/29/17 1500  TempSrc:   PainSc: 5       Patients Stated Pain Goal: 3 (91/50/41 3643)  Complications: No apparent anesthesia complications

## 2017-03-29 NOTE — Anesthesia Postprocedure Evaluation (Signed)
Anesthesia Post Note  Patient: Victor Wang  Procedure(s) Performed: Procedure(s) (LRB): CYSTOSCOPY WITH CLOT EVACUATION transurethral resection bladder tumor retrograde pylegram left ureter (N/A)  Patient location during evaluation: PACU Anesthesia Type: General Level of consciousness: awake and alert Pain management: pain level controlled Vital Signs Assessment: post-procedure vital signs reviewed and stable Respiratory status: spontaneous breathing, nonlabored ventilation, respiratory function stable and patient connected to nasal cannula oxygen Cardiovascular status: blood pressure returned to baseline and stable Postop Assessment: no signs of nausea or vomiting Anesthetic complications: no       Last Vitals:  Vitals:   03/29/17 1800 03/29/17 1815  BP: 135/82 131/83  Pulse: (!) 103 (!) 105  Resp: 18 18  Temp:      Last Pain:  Vitals:   03/29/17 1815  TempSrc:   PainSc: 5                  Mackinley Kiehn,W. EDMOND

## 2017-03-29 NOTE — Progress Notes (Signed)
C/O "burning,I'm clogged manual irrigated w/ multiple clots out.

## 2017-03-29 NOTE — Brief Op Note (Signed)
03/28/2017 - 03/29/2017  5:29 PM  PATIENT:  Victor Wang  82 y.o. male  PRE-OPERATIVE DIAGNOSIS:  clot  POST-OPERATIVE DIAGNOSIS:  hematuria  PROCEDURE:  Procedure(s): CYSTOSCOPY WITH CLOT EVACUATION (N/A)  SURGEON:  Surgeon(s) and Role:    * Alexis Frock, MD - Primary  PHYSICIAN ASSISTANT:   ASSISTANTS: none   ANESTHESIA:   general  EBL:  Total I/O In: 3000 [Other:3000] Out: 4600 [Urine:4600]  BLOOD ADMINISTERED:none  DRAINS:  52f2 way catheter with irrigation port plugged  LOCAL MEDICATIONS USED:  NONE  SPECIMEN: prostatic urethral tumor  DISPOSITION OF SPECIMEN:  PATHOLOGY  COUNTS:  YES  TOURNIQUET:  * No tourniquets in log *  DICTATION: .Other Dictation: Dictation Number [id number] PLAN OF CARE: Admit to inpatient   PATIENT DISPOSITION:  PACU - hemodynamically stable.   Delay start of Pharmacological VTE agent (>24hrs) due to surgical blood loss or risk of bleeding: yes

## 2017-03-29 NOTE — Anesthesia Procedure Notes (Signed)
Procedure Name: LMA Insertion Date/Time: 03/29/2017 5:02 PM Performed by: Noralyn Pick D Pre-anesthesia Checklist: Patient identified, Emergency Drugs available, Suction available and Patient being monitored Patient Re-evaluated:Patient Re-evaluated prior to inductionOxygen Delivery Method: Circle system utilized Preoxygenation: Pre-oxygenation with 100% oxygen Intubation Type: IV induction Ventilation: Mask ventilation without difficulty LMA: LMA with gastric port inserted LMA Size: 5.0 Tube type: Oral Number of attempts: 1 Placement Confirmation: positive ETCO2 and breath sounds checked- equal and bilateral Tube secured with: Tape Dental Injury: Teeth and Oropharynx as per pre-operative assessment

## 2017-03-29 NOTE — Anesthesia Preprocedure Evaluation (Addendum)
Anesthesia Evaluation  Patient identified by MRN, date of birth, ID band Patient awake    Reviewed: Allergy & Precautions, H&P , NPO status , Patient's Chart, lab work & pertinent test results, reviewed documented beta blocker date and time   Airway Mallampati: II  TM Distance: >3 FB Neck ROM: Full    Dental no notable dental hx. (+) Edentulous Upper, Edentulous Lower, Dental Advisory Given   Pulmonary COPD,  COPD inhaler, former smoker,    Pulmonary exam normal breath sounds clear to auscultation       Cardiovascular hypertension, Pt. on medications and Pt. on home beta blockers +CHF   Rhythm:Regular Rate:Normal     Neuro/Psych negative neurological ROS  negative psych ROS   GI/Hepatic Neg liver ROS, GERD  Medicated and Controlled,  Endo/Other  Morbid obesity  Renal/GU Renal disease  negative genitourinary   Musculoskeletal  (+) Arthritis , Osteoarthritis,    Abdominal   Peds  Hematology negative hematology ROS (+) anemia ,   Anesthesia Other Findings   Reproductive/Obstetrics negative OB ROS                            Anesthesia Physical Anesthesia Plan  ASA: III  Anesthesia Plan: General   Post-op Pain Management:    Induction: Intravenous  Airway Management Planned: LMA  Additional Equipment:   Intra-op Plan:   Post-operative Plan: Extubation in OR  Informed Consent: I have reviewed the patients History and Physical, chart, labs and discussed the procedure including the risks, benefits and alternatives for the proposed anesthesia with the patient or authorized representative who has indicated his/her understanding and acceptance.   Dental advisory given  Plan Discussed with: CRNA  Anesthesia Plan Comments:         Anesthesia Quick Evaluation

## 2017-03-30 ENCOUNTER — Encounter (HOSPITAL_COMMUNITY): Payer: Self-pay | Admitting: Urology

## 2017-03-30 MED ORDER — VITAMINS A & D EX OINT
TOPICAL_OINTMENT | CUTANEOUS | Status: AC
  Filled 2017-03-30: qty 5

## 2017-03-30 NOTE — Discharge Instructions (Signed)
1 - You may have urinary urgency (bladder spasms) and bloody urine on / off x few days. This is normal. ° °2 - Call MD or go to ER for fever >102, severe pain / nausea / vomiting not relieved by medications, or acute change in medical status ° °

## 2017-03-30 NOTE — Care Management Note (Signed)
Case Management Note  Patient Details  Name: Victor Wang MRN: [id number] Date of Birth: [birth year]  Subjective/Objective:Recc HHRN/HHPT-await HHC, f41[place].[facility]                    Action/Plan:d/c home w/HHC.   Expected Discharge Date:   (unknown)               Expected Discharge Plan:  [place] Referral:     Discharge planning Services     [place] Acute Care Choice:  [place](home 02 continuous,has travel tank, Active w/[facility]0HHRN-meds,v/s.) Choice offered to:     DME Arranged:    DME Agency:     HH Arranged:  PT, RN [facility]  [place] Status of Service:  Completed, signed off  If discussed at [place] Meetings, dates discussed:    Additional Comments:  MDessa Phi RN 03/30/2017, 11:17 AM

## 2017-03-30 NOTE — Care Management Note (Signed)
Case Management Note  Patient Details  Name: Victor Wang MRN: [id number] Date of Birth: May 21, 1935  Subjective/Objective:  [place] orders faxed to [place] w/confirmation. No further CM needs.                  Action/Plan:d/c home w/HHC.   Expected Discharge Date:  03/30/17               Expected Discharge Plan:  [place]  In-House Referral:     Discharge planning Services     Post Acute Care Choice:  Durable Medical Equipment, Home Health (home 02 continuous,has travel tank, Active w/[facility]0HHRN-meds,v/s.) Choice offered to:     DME Arranged:    DME Agency:     HH Arranged:  PT, RN [place] Agency:  [place]  Status of Service:  Completed, signed off  If discussed at [place] of Stay Meetings, dates discussed:    Additional Comments:  [name], RN 03/30/2017, 2:23 PM

## 2017-03-30 NOTE — Evaluation (Signed)
Physical Therapy Evaluation Patient Details Name: Victor Wang MRN: [id number] DOB: [birth year] Today's Date: 03/30/2017   History of Present Illness  Pt is a 82 year old male s/p Cystoscopy with clot evacuation with hx of High Grade Bladder Cancer / Rt Renal Pelvis Cancer- s/p RIGHT nephroureterectomy 01/2017   Clinical Impression  Pt admitted with above diagnosis. Pt currently with functional limitations due to the deficits listed below (see PT Problem List).  Pt will benefit from skilled PT to increase their independence and safety with mobility to allow discharge to the venue listed below.  Pt mobilizing well and reports he was still receiving HHPT from previous admission.     Follow Up Recommendations Home health PT (pt was receiving HHPT prior to admission, defer to [place])    Equipment Recommendations  None recommended by PT    Recommendations for Other Services       Precautions / Restrictions Precautions Precaution Comments: chronic 3L O2  Restrictions Weight Bearing Restrictions: No      Mobility  Bed Mobility               General bed mobility comments: pt up in recliner  Transfers Overall transfer level: Needs assistance Equipment used: Rolling walker (2 wheeled) Transfers: Sit to/from Stand Sit to Stand: Supervision            Ambulation/Gait Ambulation/Gait assistance: Supervision Ambulation Distance (Feet): 200 Feet Assistive device: Rolling walker (2 wheeled) Gait Pattern/deviations: Step-through pattern;Decreased stride length     General Gait Details: pt using RW well, maintained 3L O2 [place], distance to tolerance  Stairs            Wheelchair Mobility    Modified Rankin (Stroke Patients Only)       Balance                                             Pertinent Vitals/Pain Pain Assessment: Faces Faces Pain Scale: Hurts little more Pain Location: reports back pain Pain Descriptors / Indicators:  Aching Pain Intervention(s): Limited activity within patient's tolerance;Monitored during session;Repositioned    Home Living Family/patient expects to be discharged to:: Private residence Living Arrangements: Spouse/significant other Available Help at Discharge: Family Type of Home: House Home Access: Stairs to enter   Technical brewer of Steps: 2 and 1 Home Layout: One level Home Equipment: Environmental consultant - 2 wheels      Prior Function Level of Independence: Independent with assistive device(s)         Comments: typically uses RW for community, reports sometimes using RW at home but more frequently holds onto furniture/walls     Hand Dominance        Extremity/Trunk Assessment        Lower Extremity Assessment Lower Extremity Assessment: Overall WFL for tasks assessed       Communication   Communication: No difficulties  Cognition Arousal/Alertness: Awake/alert Behavior During Therapy: WFL for tasks assessed/performed Overall Cognitive Status: Within Functional Limits for tasks assessed                                        General Comments      Exercises     Assessment/Plan    PT Assessment Patient needs continued PT services  PT Problem List Decreased  mobility;Decreased activity tolerance       PT Treatment Interventions DME instruction;Gait training;Therapeutic exercise;Therapeutic activities;Functional mobility training;Stair training;Patient/family education    PT Goals (Current goals can be found in the Care Plan section)  Acute Rehab PT Goals PT Goal Formulation: With patient Time For Goal Achievement: 04/06/17 Potential to Achieve Goals: Good    Frequency Min 3X/week   Barriers to discharge        Co-evaluation               AM-PAC PT "6 Clicks" Daily Activity  Outcome Measure Difficulty turning over in bed (including adjusting bedclothes, sheets and blankets)?: None Difficulty moving from lying on back to  sitting on the side of the bed? : None Difficulty sitting down on and standing up from a chair with arms (e.g., wheelchair, bedside commode, etc,.)?: A Little Help needed moving to and from a bed to chair (including a wheelchair)?: A Little Help needed walking in hospital room?: A Little Help needed climbing 3-5 steps with a railing? : A Little 6 Click Score: 20    End of Session Equipment Utilized During Treatment: Oxygen Activity Tolerance: Patient tolerated treatment well Patient left: in chair;with family/visitor present Nurse Communication: Mobility status PT Visit Diagnosis: Difficulty in walking, not elsewhere classified (R26.2)    Time: 3338-3291 PT Time Calculation (min) (ACUTE ONLY): 16 min   Charges:   PT Evaluation $PT Eval Low Complexity: 1 Procedure     PT G CodesCarmelia Bake, PT, DPT 03/30/2017 Pager: [phone]   York Ram E 03/30/2017, 10:25 AM

## 2017-03-30 NOTE — Discharge Summary (Signed)
Physician Discharge Summary  Patient ID: Victor Wang MRN: [id number] DOB/AGE: 1935/08/19 82 y.o.  Admit date: 03/28/2017 Discharge date: 03/30/2017  Admission Diagnoses: Gross Hematuria  Discharge Diagnoses:  Active Problems:   Urinary retention Gross hematuria, bladder cancer  Discharged Condition: fair  Hospital Course: Pt admitted 03/28/17 with clot urinary retention. Initially managed with 3 way catheter and irrigation. On AM of 5/2 he was noted to still have significant hematuria therefore taken to OR where he was found to have liekly small voluem bladder cancer recurrence in area of prostatic urethra / bladder neck and treated with cysto + clot evacuation + fulgeration + transurethral resection of bladder tumor. On 5/3 his urine was completely clear therefore catheter removed and by PM of 5/3 his pain is controlled, maintaining hydratino, voiding w/o gross hematuria and felt to be adequate for discharge.   Consults: None  Significant Diagnostic Studies: labs: surgical pathology pending  Treatments: surgery: as per above  Discharge Exam: Blood pressure (!) 110/57, pulse 99, temperature 99.1 F (37.3 C), temperature source Oral, resp. rate 17, height '6\' 1"'$  (1.854 m), weight 135.4 kg (298 lb 8.1 oz), SpO2 97 %. General appearance: alert, cooperative, appears stated age and at baseline. Daughter at bedside.  Eyes: negative Nose: Nares normal. Septum midline. Mucosa normal. No drainage or sinus tenderness. Throat: lips, mucosa, and tongue normal; teeth and gums normal Neck: supple, symmetrical, trachea midline Back: symmetric, no curvature. ROM normal. No CVA tenderness. Resp: non-labored on room air.  Cardio: Nl rate GI: soft, non-tender; bowel sounds normal; no masses,  no organomegaly and stable morbid truncal obesity.  Male genitalia: normal Extremities: extremities normal, atraumatic, no cyanosis or edema Pulses: 2+ and symmetric Skin: Skin color, texture, turgor  normal. No rashes or lesions Lymph nodes: Cervical, supraclavicular, and axillary nodes normal. Neurologic: Grossly normal  Disposition: 03-Skilled Nursing Facility   Allergies as of 03/30/2017      Reactions   Bee Venom Anaphylaxis   Penicillins Anaphylaxis, Hives, Other (See Comments)   Has patient had a PCN reaction causing immediate rash, facial/tongue/throat swelling, SOB or lightheadedness with hypotension: Yes Has patient had a PCN reaction causing severe rash involving mucus membranes or skin necrosis: Yes Has patient had a PCN reaction that required hospitalization No Has patient had a PCN reaction occurring within the last 10 years: No If all of the above answers are "NO", then may proceed with Cephalosporin use.   Tramadol Other (See Comments)   Dizzy and loopy.       Medication List    TAKE these medications   acetaminophen 650 MG CR tablet Commonly known as:  TYLENOL Take 1,300 mg by mouth every 8 (eight) hours as needed for pain.   albuterol 108 (90 Base) MCG/ACT inhaler Commonly known as:  PROVENTIL HFA;VENTOLIN HFA Inhale 1-2 puffs into the lungs every 6 (six) hours as needed for wheezing or shortness of breath.   allopurinol 100 MG tablet Commonly known as:  ZYLOPRIM Take 100 mg by mouth daily.   calcium-vitamin D 500-200 MG-UNIT tablet Commonly known as:  OSCAL WITH D Take 1 tablet by mouth daily with breakfast.   ferrous sulfate 325 (65 FE) MG tablet Take 1 tablet (325 mg total) by mouth 3 (three) times daily with meals. What changed:  when to take this   furosemide 40 MG tablet Commonly known as:  LASIX Take 40 mg by mouth 2 (two) times daily.   HYDROcodone-acetaminophen 5-325 MG tablet Commonly known as:  NORCO Take  1-2 tablets by mouth every 6 (six) hours as needed for moderate pain or severe pain.   metoprolol 50 MG tablet Commonly known as:  LOPRESSOR Take 50 mg by mouth daily.   omeprazole 20 MG capsule Commonly known as:  PRILOSEC Take  20 mg by mouth daily.   polyethylene glycol packet Commonly known as:  MIRALAX / GLYCOLAX Take 17 g by mouth daily.   potassium chloride SA 20 MEQ tablet Commonly known as:  K-DUR,KLOR-CON Take 20 mEq by mouth daily.   pravastatin 80 MG tablet Commonly known as:  PRAVACHOL Take 80 mg by mouth daily.      Follow-up [facility] Follow up.   Specialty:  [place] Why:  [facility] [place] nursing, physical therapy Contact information: [address] [place] [address] [phone]        Alexis Frock, MD Follow up.   Specialty:  Urology Why:  As previously scheduled in few mos with cystsocopy and X-Rays.  Contact information: [place] [address] [phone]           Signed: Alexis Frock 03/30/2017, 2:11 PM

## 2017-03-31 DIAGNOSIS — N189 Chronic kidney disease, unspecified: Secondary | ICD-10-CM | POA: Diagnosis not present

## 2017-03-31 DIAGNOSIS — C641 Malignant neoplasm of right kidney, except renal pelvis: Secondary | ICD-10-CM | POA: Diagnosis not present

## 2017-03-31 DIAGNOSIS — I5033 Acute on chronic diastolic (congestive) heart failure: Secondary | ICD-10-CM | POA: Diagnosis not present

## 2017-03-31 DIAGNOSIS — I130 Hypertensive heart and chronic kidney disease with heart failure and stage 1 through stage 4 chronic kidney disease, or unspecified chronic kidney disease: Secondary | ICD-10-CM | POA: Diagnosis not present

## 2017-03-31 DIAGNOSIS — D509 Iron deficiency anemia, unspecified: Secondary | ICD-10-CM | POA: Diagnosis not present

## 2017-03-31 DIAGNOSIS — Z483 Aftercare following surgery for neoplasm: Secondary | ICD-10-CM | POA: Diagnosis not present

## 2017-04-04 DIAGNOSIS — S161XXA Strain of muscle, fascia and tendon at neck level, initial encounter: Secondary | ICD-10-CM | POA: Diagnosis not present

## 2017-04-04 DIAGNOSIS — M9903 Segmental and somatic dysfunction of lumbar region: Secondary | ICD-10-CM | POA: Diagnosis not present

## 2017-04-04 DIAGNOSIS — M9902 Segmental and somatic dysfunction of thoracic region: Secondary | ICD-10-CM | POA: Diagnosis not present

## 2017-04-04 DIAGNOSIS — M9901 Segmental and somatic dysfunction of cervical region: Secondary | ICD-10-CM | POA: Diagnosis not present

## 2017-04-04 DIAGNOSIS — S29019A Strain of muscle and tendon of unspecified wall of thorax, initial encounter: Secondary | ICD-10-CM | POA: Diagnosis not present

## 2017-04-04 DIAGNOSIS — M5387 Other specified dorsopathies, lumbosacral region: Secondary | ICD-10-CM | POA: Diagnosis not present

## 2017-04-06 DIAGNOSIS — I130 Hypertensive heart and chronic kidney disease with heart failure and stage 1 through stage 4 chronic kidney disease, or unspecified chronic kidney disease: Secondary | ICD-10-CM | POA: Diagnosis not present

## 2017-04-06 DIAGNOSIS — I5033 Acute on chronic diastolic (congestive) heart failure: Secondary | ICD-10-CM | POA: Diagnosis not present

## 2017-04-06 DIAGNOSIS — C641 Malignant neoplasm of right kidney, except renal pelvis: Secondary | ICD-10-CM | POA: Diagnosis not present

## 2017-04-06 DIAGNOSIS — N189 Chronic kidney disease, unspecified: Secondary | ICD-10-CM | POA: Diagnosis not present

## 2017-04-06 DIAGNOSIS — Z483 Aftercare following surgery for neoplasm: Secondary | ICD-10-CM | POA: Diagnosis not present

## 2017-04-06 DIAGNOSIS — D509 Iron deficiency anemia, unspecified: Secondary | ICD-10-CM | POA: Diagnosis not present

## 2017-04-11 DIAGNOSIS — I130 Hypertensive heart and chronic kidney disease with heart failure and stage 1 through stage 4 chronic kidney disease, or unspecified chronic kidney disease: Secondary | ICD-10-CM | POA: Diagnosis not present

## 2017-04-11 DIAGNOSIS — N189 Chronic kidney disease, unspecified: Secondary | ICD-10-CM | POA: Diagnosis not present

## 2017-04-11 DIAGNOSIS — D509 Iron deficiency anemia, unspecified: Secondary | ICD-10-CM | POA: Diagnosis not present

## 2017-04-11 DIAGNOSIS — C641 Malignant neoplasm of right kidney, except renal pelvis: Secondary | ICD-10-CM | POA: Diagnosis not present

## 2017-04-11 DIAGNOSIS — Z483 Aftercare following surgery for neoplasm: Secondary | ICD-10-CM | POA: Diagnosis not present

## 2017-04-11 DIAGNOSIS — I5033 Acute on chronic diastolic (congestive) heart failure: Secondary | ICD-10-CM | POA: Diagnosis not present

## 2017-04-17 DIAGNOSIS — R350 Frequency of micturition: Secondary | ICD-10-CM | POA: Diagnosis not present

## 2017-04-17 DIAGNOSIS — N401 Enlarged prostate with lower urinary tract symptoms: Secondary | ICD-10-CM | POA: Diagnosis not present

## 2017-04-17 DIAGNOSIS — R351 Nocturia: Secondary | ICD-10-CM | POA: Diagnosis not present

## 2017-04-18 DIAGNOSIS — C641 Malignant neoplasm of right kidney, except renal pelvis: Secondary | ICD-10-CM | POA: Diagnosis not present

## 2017-04-18 DIAGNOSIS — N189 Chronic kidney disease, unspecified: Secondary | ICD-10-CM | POA: Diagnosis not present

## 2017-04-18 DIAGNOSIS — Z483 Aftercare following surgery for neoplasm: Secondary | ICD-10-CM | POA: Diagnosis not present

## 2017-04-18 DIAGNOSIS — I5033 Acute on chronic diastolic (congestive) heart failure: Secondary | ICD-10-CM | POA: Diagnosis not present

## 2017-04-18 DIAGNOSIS — I130 Hypertensive heart and chronic kidney disease with heart failure and stage 1 through stage 4 chronic kidney disease, or unspecified chronic kidney disease: Secondary | ICD-10-CM | POA: Diagnosis not present

## 2017-04-18 DIAGNOSIS — D509 Iron deficiency anemia, unspecified: Secondary | ICD-10-CM | POA: Diagnosis not present

## 2017-04-20 DIAGNOSIS — I130 Hypertensive heart and chronic kidney disease with heart failure and stage 1 through stage 4 chronic kidney disease, or unspecified chronic kidney disease: Secondary | ICD-10-CM | POA: Diagnosis not present

## 2017-04-20 DIAGNOSIS — C641 Malignant neoplasm of right kidney, except renal pelvis: Secondary | ICD-10-CM | POA: Diagnosis not present

## 2017-04-20 DIAGNOSIS — N189 Chronic kidney disease, unspecified: Secondary | ICD-10-CM | POA: Diagnosis not present

## 2017-04-20 DIAGNOSIS — D509 Iron deficiency anemia, unspecified: Secondary | ICD-10-CM | POA: Diagnosis not present

## 2017-04-20 DIAGNOSIS — Z483 Aftercare following surgery for neoplasm: Secondary | ICD-10-CM | POA: Diagnosis not present

## 2017-04-20 DIAGNOSIS — I5033 Acute on chronic diastolic (congestive) heart failure: Secondary | ICD-10-CM | POA: Diagnosis not present

## 2017-04-29 NOTE — Addendum Note (Signed)
Addendum  created 04/29/17 7703 by Duane Boston, MD   Sign clinical note

## 2017-05-01 DIAGNOSIS — I5033 Acute on chronic diastolic (congestive) heart failure: Secondary | ICD-10-CM | POA: Diagnosis not present

## 2017-05-01 DIAGNOSIS — C641 Malignant neoplasm of right kidney, except renal pelvis: Secondary | ICD-10-CM | POA: Diagnosis not present

## 2017-05-01 DIAGNOSIS — I130 Hypertensive heart and chronic kidney disease with heart failure and stage 1 through stage 4 chronic kidney disease, or unspecified chronic kidney disease: Secondary | ICD-10-CM | POA: Diagnosis not present

## 2017-05-01 DIAGNOSIS — D509 Iron deficiency anemia, unspecified: Secondary | ICD-10-CM | POA: Diagnosis not present

## 2017-05-01 DIAGNOSIS — N189 Chronic kidney disease, unspecified: Secondary | ICD-10-CM | POA: Diagnosis not present

## 2017-05-01 DIAGNOSIS — Z483 Aftercare following surgery for neoplasm: Secondary | ICD-10-CM | POA: Diagnosis not present

## 2017-05-01 NOTE — Addendum Note (Signed)
Addendum  created 05/01/17 1205 by Porschea Borys, MD   Sign clinical note    

## 2017-05-03 DIAGNOSIS — I130 Hypertensive heart and chronic kidney disease with heart failure and stage 1 through stage 4 chronic kidney disease, or unspecified chronic kidney disease: Secondary | ICD-10-CM | POA: Diagnosis not present

## 2017-05-03 DIAGNOSIS — C641 Malignant neoplasm of right kidney, except renal pelvis: Secondary | ICD-10-CM | POA: Diagnosis not present

## 2017-05-03 DIAGNOSIS — I5033 Acute on chronic diastolic (congestive) heart failure: Secondary | ICD-10-CM | POA: Diagnosis not present

## 2017-05-03 DIAGNOSIS — Z483 Aftercare following surgery for neoplasm: Secondary | ICD-10-CM | POA: Diagnosis not present

## 2017-05-03 DIAGNOSIS — D509 Iron deficiency anemia, unspecified: Secondary | ICD-10-CM | POA: Diagnosis not present

## 2017-05-03 DIAGNOSIS — N189 Chronic kidney disease, unspecified: Secondary | ICD-10-CM | POA: Diagnosis not present

## 2017-05-04 ENCOUNTER — Emergency Department (HOSPITAL_COMMUNITY): Payer: Medicare Other

## 2017-05-04 ENCOUNTER — Encounter (HOSPITAL_COMMUNITY): Payer: Self-pay | Admitting: *Deleted

## 2017-05-04 ENCOUNTER — Inpatient Hospital Stay (HOSPITAL_COMMUNITY)
Admission: EM | Admit: 2017-05-04 | Discharge: 2017-05-10 | DRG: 477 | Disposition: A | Discharge status: Skilled Nursing Facility | Payer: Medicare Other | Attending: Family Medicine | Admitting: Family Medicine

## 2017-05-04 DIAGNOSIS — M8448XA Pathological fracture, other site, initial encounter for fracture: Secondary | ICD-10-CM | POA: Diagnosis present

## 2017-05-04 DIAGNOSIS — I509 Heart failure, unspecified: Secondary | ICD-10-CM | POA: Diagnosis not present

## 2017-05-04 DIAGNOSIS — K8020 Calculus of gallbladder without cholecystitis without obstruction: Secondary | ICD-10-CM | POA: Diagnosis not present

## 2017-05-04 DIAGNOSIS — C787 Secondary malignant neoplasm of liver and intrahepatic bile duct: Secondary | ICD-10-CM | POA: Diagnosis present

## 2017-05-04 DIAGNOSIS — L89159 Pressure ulcer of sacral region, unspecified stage: Secondary | ICD-10-CM | POA: Diagnosis present

## 2017-05-04 DIAGNOSIS — L8990 Pressure ulcer of unspecified site, unspecified stage: Secondary | ICD-10-CM | POA: Insufficient documentation

## 2017-05-04 DIAGNOSIS — I371 Nonrheumatic pulmonary valve insufficiency: Secondary | ICD-10-CM | POA: Diagnosis not present

## 2017-05-04 DIAGNOSIS — Z880 Allergy status to penicillin: Secondary | ICD-10-CM | POA: Diagnosis not present

## 2017-05-04 DIAGNOSIS — Z905 Acquired absence of kidney: Secondary | ICD-10-CM

## 2017-05-04 DIAGNOSIS — R29898 Other symptoms and signs involving the musculoskeletal system: Secondary | ICD-10-CM | POA: Diagnosis not present

## 2017-05-04 DIAGNOSIS — I110 Hypertensive heart disease with heart failure: Secondary | ICD-10-CM | POA: Diagnosis not present

## 2017-05-04 DIAGNOSIS — R918 Other nonspecific abnormal finding of lung field: Secondary | ICD-10-CM | POA: Diagnosis not present

## 2017-05-04 DIAGNOSIS — C651 Malignant neoplasm of right renal pelvis: Secondary | ICD-10-CM

## 2017-05-04 DIAGNOSIS — T1590XA Foreign body on external eye, part unspecified, unspecified eye, initial encounter: Secondary | ICD-10-CM

## 2017-05-04 DIAGNOSIS — E785 Hyperlipidemia, unspecified: Secondary | ICD-10-CM | POA: Diagnosis present

## 2017-05-04 DIAGNOSIS — C801 Malignant (primary) neoplasm, unspecified: Secondary | ICD-10-CM | POA: Diagnosis not present

## 2017-05-04 DIAGNOSIS — D509 Iron deficiency anemia, unspecified: Secondary | ICD-10-CM | POA: Diagnosis not present

## 2017-05-04 DIAGNOSIS — K5900 Constipation, unspecified: Secondary | ICD-10-CM | POA: Diagnosis not present

## 2017-05-04 DIAGNOSIS — R531 Weakness: Secondary | ICD-10-CM | POA: Diagnosis not present

## 2017-05-04 DIAGNOSIS — M6281 Muscle weakness (generalized): Secondary | ICD-10-CM | POA: Diagnosis not present

## 2017-05-04 DIAGNOSIS — Z8550 Personal history of malignant neoplasm of unspecified urinary tract organ: Secondary | ICD-10-CM | POA: Diagnosis not present

## 2017-05-04 DIAGNOSIS — I5040 Unspecified combined systolic (congestive) and diastolic (congestive) heart failure: Secondary | ICD-10-CM | POA: Diagnosis not present

## 2017-05-04 DIAGNOSIS — G589 Mononeuropathy, unspecified: Secondary | ICD-10-CM | POA: Diagnosis present

## 2017-05-04 DIAGNOSIS — C799 Secondary malignant neoplasm of unspecified site: Secondary | ICD-10-CM

## 2017-05-04 DIAGNOSIS — R296 Repeated falls: Secondary | ICD-10-CM | POA: Diagnosis present

## 2017-05-04 DIAGNOSIS — D649 Anemia, unspecified: Secondary | ICD-10-CM | POA: Diagnosis not present

## 2017-05-04 DIAGNOSIS — I5031 Acute diastolic (congestive) heart failure: Secondary | ICD-10-CM | POA: Diagnosis not present

## 2017-05-04 DIAGNOSIS — C7949 Secondary malignant neoplasm of other parts of nervous system: Secondary | ICD-10-CM | POA: Diagnosis not present

## 2017-05-04 DIAGNOSIS — J989 Respiratory disorder, unspecified: Secondary | ICD-10-CM | POA: Diagnosis not present

## 2017-05-04 DIAGNOSIS — I10 Essential (primary) hypertension: Secondary | ICD-10-CM | POA: Diagnosis not present

## 2017-05-04 DIAGNOSIS — K219 Gastro-esophageal reflux disease without esophagitis: Secondary | ICD-10-CM | POA: Diagnosis not present

## 2017-05-04 DIAGNOSIS — R220 Localized swelling, mass and lump, head: Secondary | ICD-10-CM | POA: Diagnosis present

## 2017-05-04 DIAGNOSIS — R1909 Other intra-abdominal and pelvic swelling, mass and lump: Secondary | ICD-10-CM | POA: Diagnosis not present

## 2017-05-04 DIAGNOSIS — C641 Malignant neoplasm of right kidney, except renal pelvis: Secondary | ICD-10-CM | POA: Diagnosis present

## 2017-05-04 DIAGNOSIS — C7989 Secondary malignant neoplasm of other specified sites: Secondary | ICD-10-CM | POA: Diagnosis not present

## 2017-05-04 DIAGNOSIS — T1500XA Foreign body in cornea, unspecified eye, initial encounter: Secondary | ICD-10-CM | POA: Diagnosis not present

## 2017-05-04 DIAGNOSIS — G4730 Sleep apnea, unspecified: Secondary | ICD-10-CM | POA: Diagnosis present

## 2017-05-04 DIAGNOSIS — C679 Malignant neoplasm of bladder, unspecified: Secondary | ICD-10-CM | POA: Diagnosis not present

## 2017-05-04 DIAGNOSIS — M899 Disorder of bone, unspecified: Secondary | ICD-10-CM | POA: Diagnosis not present

## 2017-05-04 DIAGNOSIS — M898X8 Other specified disorders of bone, other site: Secondary | ICD-10-CM | POA: Diagnosis not present

## 2017-05-04 DIAGNOSIS — R404 Transient alteration of awareness: Secondary | ICD-10-CM | POA: Diagnosis not present

## 2017-05-04 DIAGNOSIS — J449 Chronic obstructive pulmonary disease, unspecified: Secondary | ICD-10-CM | POA: Diagnosis not present

## 2017-05-04 DIAGNOSIS — Z79899 Other long term (current) drug therapy: Secondary | ICD-10-CM

## 2017-05-04 DIAGNOSIS — C7800 Secondary malignant neoplasm of unspecified lung: Secondary | ICD-10-CM | POA: Diagnosis not present

## 2017-05-04 DIAGNOSIS — K7460 Unspecified cirrhosis of liver: Secondary | ICD-10-CM | POA: Diagnosis not present

## 2017-05-04 DIAGNOSIS — R599 Enlarged lymph nodes, unspecified: Secondary | ICD-10-CM | POA: Diagnosis not present

## 2017-05-04 DIAGNOSIS — Z8551 Personal history of malignant neoplasm of bladder: Secondary | ICD-10-CM | POA: Diagnosis not present

## 2017-05-04 DIAGNOSIS — D492 Neoplasm of unspecified behavior of bone, soft tissue, and skin: Secondary | ICD-10-CM | POA: Diagnosis not present

## 2017-05-04 DIAGNOSIS — Z0389 Encounter for observation for other suspected diseases and conditions ruled out: Secondary | ICD-10-CM | POA: Diagnosis not present

## 2017-05-04 DIAGNOSIS — G893 Neoplasm related pain (acute) (chronic): Secondary | ICD-10-CM | POA: Diagnosis not present

## 2017-05-04 DIAGNOSIS — M79606 Pain in leg, unspecified: Secondary | ICD-10-CM | POA: Diagnosis not present

## 2017-05-04 DIAGNOSIS — I5033 Acute on chronic diastolic (congestive) heart failure: Secondary | ICD-10-CM | POA: Diagnosis present

## 2017-05-04 DIAGNOSIS — C7951 Secondary malignant neoplasm of bone: Secondary | ICD-10-CM | POA: Diagnosis not present

## 2017-05-04 DIAGNOSIS — Z87891 Personal history of nicotine dependence: Secondary | ICD-10-CM

## 2017-05-04 DIAGNOSIS — I272 Pulmonary hypertension, unspecified: Secondary | ICD-10-CM | POA: Diagnosis present

## 2017-05-04 DIAGNOSIS — R05 Cough: Secondary | ICD-10-CM | POA: Diagnosis not present

## 2017-05-04 DIAGNOSIS — N400 Enlarged prostate without lower urinary tract symptoms: Secondary | ICD-10-CM | POA: Diagnosis present

## 2017-05-04 DIAGNOSIS — N289 Disorder of kidney and ureter, unspecified: Secondary | ICD-10-CM | POA: Diagnosis not present

## 2017-05-04 DIAGNOSIS — Z9181 History of falling: Secondary | ICD-10-CM | POA: Diagnosis not present

## 2017-05-04 DIAGNOSIS — M5414 Radiculopathy, thoracic region: Secondary | ICD-10-CM | POA: Diagnosis present

## 2017-05-04 LAB — URINALYSIS, ROUTINE W REFLEX MICROSCOPIC
BILIRUBIN URINE: NEGATIVE
GLUCOSE, UA: NEGATIVE mg/dL
Ketones, ur: NEGATIVE mg/dL
NITRITE: NEGATIVE
Protein, ur: NEGATIVE mg/dL
SPECIFIC GRAVITY, URINE: 1.017 (ref 1.005–1.030)
pH: 5 (ref 5.0–8.0)

## 2017-05-04 LAB — CBC WITH DIFFERENTIAL/PLATELET
BASOS ABS: 0 10*3/uL (ref 0.0–0.1)
BASOS PCT: 0 %
EOS ABS: 0 10*3/uL (ref 0.0–0.7)
EOS PCT: 0 %
HCT: 28.8 % — ABNORMAL LOW (ref 39.0–52.0)
HEMOGLOBIN: 9.1 g/dL — AB (ref 13.0–17.0)
LYMPHS ABS: 0.8 10*3/uL (ref 0.7–4.0)
Lymphocytes Relative: 12 %
MCH: 29.4 pg (ref 26.0–34.0)
MCHC: 31.6 g/dL (ref 30.0–36.0)
MCV: 93.2 fL (ref 78.0–100.0)
Monocytes Absolute: 0.3 10*3/uL (ref 0.1–1.0)
Monocytes Relative: 4 %
NEUTROS PCT: 84 %
Neutro Abs: 6 10*3/uL (ref 1.7–7.7)
PLATELETS: 197 10*3/uL (ref 150–400)
RBC: 3.09 MIL/uL — AB (ref 4.22–5.81)
RDW: 15 % (ref 11.5–15.5)
WBC: 7.2 10*3/uL (ref 4.0–10.5)

## 2017-05-04 LAB — COMPREHENSIVE METABOLIC PANEL
ALBUMIN: 2.8 g/dL — AB (ref 3.5–5.0)
ALK PHOS: 359 U/L — AB (ref 38–126)
ALT: 36 U/L (ref 17–63)
ANION GAP: 8 (ref 5–15)
AST: 39 U/L (ref 15–41)
BUN: 23 mg/dL — ABNORMAL HIGH (ref 6–20)
CO2: 29 mmol/L (ref 22–32)
CREATININE: 0.98 mg/dL (ref 0.61–1.24)
Calcium: 10.3 mg/dL (ref 8.9–10.3)
Chloride: 98 mmol/L — ABNORMAL LOW (ref 101–111)
GFR calc non Af Amer: >60 mL/min (ref >60)
GLUCOSE: 124 mg/dL — AB (ref 65–99)
Potassium: 4.2 mmol/L (ref 3.5–5.1)
SODIUM: 135 mmol/L (ref 135–145)
Total Bilirubin: 1.1 mg/dL (ref 0.3–1.2)
Total Protein: 6.4 g/dL — ABNORMAL LOW (ref 6.5–8.1)

## 2017-05-04 LAB — APTT: aPTT: 38 seconds — ABNORMAL HIGH (ref 24–36)

## 2017-05-04 LAB — PROTIME-INR
INR: 1.29
PROTHROMBIN TIME: 16.2 s — AB (ref 11.4–15.2)

## 2017-05-04 LAB — BRAIN NATRIURETIC PEPTIDE: B Natriuretic Peptide: 113.6 pg/mL — ABNORMAL HIGH (ref 0.0–100.0)

## 2017-05-04 LAB — RAPID URINE DRUG SCREEN, HOSP PERFORMED
Amphetamines: NOT DETECTED
BARBITURATES: NOT DETECTED
Benzodiazepines: NOT DETECTED
COCAINE: NOT DETECTED
OPIATES: NOT DETECTED
Tetrahydrocannabinol: NOT DETECTED

## 2017-05-04 LAB — TROPONIN I

## 2017-05-04 MED ORDER — ALLOPURINOL 100 MG PO TABS
100.0000 mg | ORAL_TABLET | Freq: Every day | ORAL | Status: DC
  Administered 2017-05-05 – 2017-05-10 (×6): 100 mg via ORAL
  Filled 2017-05-04 (×6): qty 1

## 2017-05-04 MED ORDER — CALCIUM CARBONATE-VITAMIN D 500-200 MG-UNIT PO TABS
1.0000 | ORAL_TABLET | Freq: Every day | ORAL | Status: DC
  Administered 2017-05-05 – 2017-05-08 (×4): 1 via ORAL
  Filled 2017-05-04 (×5): qty 1

## 2017-05-04 MED ORDER — ACETAMINOPHEN 325 MG PO TABS
650.0000 mg | ORAL_TABLET | ORAL | Status: DC | PRN
  Administered 2017-05-05: 650 mg via ORAL
  Filled 2017-05-04: qty 2

## 2017-05-04 MED ORDER — FUROSEMIDE 10 MG/ML IJ SOLN
40.0000 mg | Freq: Two times a day (BID) | INTRAMUSCULAR | Status: DC
  Administered 2017-05-05 – 2017-05-09 (×9): 40 mg via INTRAVENOUS
  Filled 2017-05-04 (×9): qty 4

## 2017-05-04 MED ORDER — PRAVASTATIN SODIUM 40 MG PO TABS
80.0000 mg | ORAL_TABLET | Freq: Every day | ORAL | Status: DC
  Administered 2017-05-05 – 2017-05-09 (×5): 80 mg via ORAL
  Filled 2017-05-04 (×5): qty 2

## 2017-05-04 MED ORDER — FUROSEMIDE 10 MG/ML IJ SOLN
40.0000 mg | Freq: Once | INTRAMUSCULAR | Status: AC
  Administered 2017-05-05: 40 mg via INTRAVENOUS
  Filled 2017-05-04: qty 4

## 2017-05-04 MED ORDER — ONDANSETRON HCL 4 MG/2ML IJ SOLN: 4.0000 mg | Freq: Four times a day (QID) | INTRAMUSCULAR | Status: DC | PRN

## 2017-05-04 MED ORDER — SODIUM CHLORIDE 0.9% FLUSH: 3.0000 mL | INTRAVENOUS | Status: DC | PRN

## 2017-05-04 MED ORDER — FENTANYL CITRATE (PF) 100 MCG/2ML IJ SOLN
25.0000 ug | INTRAMUSCULAR | Status: DC | PRN
  Administered 2017-05-05: 25 ug via INTRAVENOUS
  Administered 2017-05-08: 50 ug via INTRAVENOUS
  Filled 2017-05-04 (×2): qty 2

## 2017-05-04 MED ORDER — POLYETHYLENE GLYCOL 3350 17 G PO PACK
17.0000 g | PACK | Freq: Every day | ORAL | Status: DC
  Administered 2017-05-05: 17 g via ORAL
  Filled 2017-05-04 (×2): qty 1

## 2017-05-04 MED ORDER — HYDROCODONE-ACETAMINOPHEN 5-325 MG PO TABS
1.0000 | ORAL_TABLET | ORAL | Status: DC | PRN
  Administered 2017-05-05 – 2017-05-10 (×13): 1 via ORAL
  Filled 2017-05-04 (×14): qty 1

## 2017-05-04 MED ORDER — METOPROLOL TARTRATE 25 MG PO TABS
50.0000 mg | ORAL_TABLET | Freq: Every day | ORAL | Status: DC
  Administered 2017-05-05 – 2017-05-10 (×6): 50 mg via ORAL
  Filled 2017-05-04 (×6): qty 2

## 2017-05-04 MED ORDER — SODIUM CHLORIDE 0.9 % IV SOLN: 250.0000 mL | INTRAVENOUS | Status: DC | PRN

## 2017-05-04 MED ORDER — FERROUS SULFATE 325 (65 FE) MG PO TABS
325.0000 mg | ORAL_TABLET | Freq: Two times a day (BID) | ORAL | Status: DC
  Administered 2017-05-05 – 2017-05-10 (×10): 325 mg via ORAL
  Filled 2017-05-04 (×10): qty 1

## 2017-05-04 MED ORDER — SODIUM CHLORIDE 0.9% FLUSH
3.0000 mL | Freq: Two times a day (BID) | INTRAVENOUS | Status: DC
  Administered 2017-05-04 – 2017-05-10 (×12): 3 mL via INTRAVENOUS

## 2017-05-04 MED ORDER — PANTOPRAZOLE SODIUM 40 MG PO TBEC
40.0000 mg | DELAYED_RELEASE_TABLET | Freq: Every day | ORAL | Status: DC
  Administered 2017-05-05 – 2017-05-06 (×2): 40 mg via ORAL
  Filled 2017-05-04 (×2): qty 1

## 2017-05-04 MED ORDER — ACETAMINOPHEN 325 MG PO TABS: 650.0000 mg | ORAL_TABLET | Freq: Four times a day (QID) | ORAL | Status: DC | PRN

## 2017-05-04 MED ORDER — ENOXAPARIN SODIUM 40 MG/0.4ML ~~LOC~~ SOLN
40.0000 mg | SUBCUTANEOUS | Status: DC
  Administered 2017-05-05 – 2017-05-06 (×3): 40 mg via SUBCUTANEOUS
  Filled 2017-05-04 (×3): qty 0.4

## 2017-05-04 NOTE — ED Triage Notes (Signed)
Per EMS, pt complains of weakness to legs bilaterally. Pt has bilateral edema to lower legs. Pt denies shortness of breath.

## 2017-05-04 NOTE — ED Provider Notes (Signed)
[facility] DEPT Provider Note   CSN: [id number] Arrival date & time: 05/04/17  1812     History   Chief Complaint Chief Complaint  Patient presents with  . Leg Swelling    HPI Victor Wang is a 82 y.o. male.  HPI  82 year old male with a history of recurrent bladder cancer, COPD, CHF and a renal mass status post nephrectomy presents with weakness and leg swelling. History taken from patient and daughter. Patient states that he has been weak since yesterday and unable to stand. He is weak at baseline and has been doing rehabilitation at home. However he has been unable to get up because he does not have enough strength over the last 48 hours. However his daughter's concern is been going on longer than that. Family friend had to help get the patient up multiple times recently. Daughter also states the patient is not as sharp as normal and seems to be a little bit confused. No fevers. Patient has also been short of breath which is an acute on chronic issue. Has had increased leg swelling. Has a history of diastolic heart failure. He has also had right maxillary facial swelling. He states is tender to the touch. He thinks his been going on for 2 weeks. Daughter states she talked to the patient yesterday and he seemed to be in his normal state of mind.  Past Medical History:  Diagnosis Date  . Anemia   . Arthritis   . Bladder cancer (HCC)    RECURRENT  . BPH (benign prostatic hypertrophy)   . CHF (congestive heart failure) ([place])   . Clot 2007    kidney biopsy done  . Complication of anesthesia    oxygen level drops when put to sleep , has some bleeding issues after every surgery with cautery required  . COPD (chronic obstructive pulmonary disease) ([place])   . Dyspnea    exertion; can not complete a flight of stairs without stopping to catch his breath   . Full dentures   . GERD (gastroesophageal reflux disease)   . History of blood transfusion last done 11-13-16   total of 7  units   . History of compression fracture of spine    04/2013   T12  . Hyperlipidemia   . Hypertension   . Nephritis 2007   oral chemo and prednisone done  . Pneumonia last 2016   x 2 in 2016  . PONV (postoperative nausea and vomiting)   . Pulmonary hypertension ([place])    mild per 12-15-15 echo [facility]  . Renal mass    right  . Sleep apnea     Patient Active Problem List   Diagnosis Date Noted  . Leg weakness   . Facial mass 05/04/2017  . Right leg weakness 05/04/2017  . Urinary retention 03/28/2017  . H/O nephroureterectomy 02/01/2017  . Cancer of renal pelvis, right ([place]) 01/31/2017  . Acute CHF ([place]) 01/01/2017  . Acute blood loss anemia 01/01/2017  . Abdominal pain 01/01/2017  . Hypotension 01/01/2017  . OSA (obstructive sleep apnea) 01/01/2017  . Essential hypertension 01/01/2017  . Bladder cancer ([place]) 09/10/2015    Past Surgical History:  Procedure Laterality Date  . APPENDECTOMY  1985  . CARPAL TUNNEL RELEASE Right 02-05-2008  . CYSTOSCOPY W/ RETROGRADES Bilateral 10/29/2014   Procedure: CYSTOSCOPY WITH RETROGRADE PYELOGRAM;  Surgeon: Alexis Frock, MD;  Location: [facility];  Service: Urology;  Laterality: Bilateral;  . CYSTOSCOPY W/ RETROGRADES Bilateral 09/10/2015  Procedure: CYSTOSCOPY WITH RETROGRADE PYELOGRAM;  Surgeon: Alexis Frock, MD;  Location: [facility];  Service: Urology;  Laterality: Bilateral;  . CYSTOSCOPY W/ URETERAL STENT PLACEMENT N/A 03/29/2017   Procedure: CYSTOSCOPY WITH CLOT EVACUATION transurethral resection bladder tumor retrograde pylegram left ureter;  Surgeon: Alexis Frock, MD;  Location: [facility];  Service: Urology;  Laterality: N/A;  . CYSTOSCOPY WITH BIOPSY N/A 05/26/2014   Procedure: CYSTOSCOPY WITH BIOPSY;  Surgeon: Sharyn Creamer, MD;  Location: [facility];  Service: Urology;  Laterality: N/A;  . CYSTOSCOPY WITH RETROGRADE PYELOGRAM, URETEROSCOPY AND STENT PLACEMENT  Bilateral 04/09/2014   Procedure: CYSTOSCOPY WITH RETROGRADE PYELOGRAM, POSSIBLE URETEROSCOPY WITH BIOPSY AND STENT PLACEMENT;  Surgeon: Sharyn Creamer, MD;  Location: [facility];  Service: Urology;  Laterality: Bilateral;  . CYSTOSCOPY WITH RETROGRADE PYELOGRAM, URETEROSCOPY AND STENT PLACEMENT Right 02/01/2017   Procedure: CYSTOSCOPY WITH RIGHT  RETROGRADE PYELOGRAM  AND RIGHT STENT PLACEMENT;  Surgeon: Alexis Frock, MD;  Location: [facility];  Service: Urology;  Laterality: Right;  . CYSTOSCOPY/RETROGRADE/URETEROSCOPY Right 12/28/2016   Procedure: CYSTOSCOPY/RETROGRADE/URETEROSCOPY WITH BIOPSY right renal pelvis insertion double j stent;  Surgeon: Alexis Frock, MD;  Location: [facility];  Service: Urology;  Laterality: Right;  . KNEE LIGAMENT RECONSTRUCTION Right 1970  . RIGHT SHOULDER SURGERY  2011  . ROBOT ASSITED LAPAROSCOPIC NEPHROURETERECTOMY Right 02/01/2017   Procedure: XI ROBOT ASSITED LAPAROSCOPIC NEPHROURETERECTOMY;  Surgeon: Alexis Frock, MD;  Location: [facility];  Service: Urology;  Laterality: Right;  . TONSILLECTOMY AND ADENOIDECTOMY  CHILD   and adenoids  . TRANSURETHRAL RESECTION OF BLADDER TUMOR WITH GYRUS (TURBT-GYRUS) Bilateral 04/09/2014   Procedure: TRANSURETHRAL RESECTION OF BLADDER TUMOR WITH GYRUS (TURBT-GYRUS);  Surgeon: Sharyn Creamer, MD;  Location: [facility];  Service: Urology;  Laterality: Bilateral;  . TRANSURETHRAL RESECTION OF BLADDER TUMOR WITH GYRUS (TURBT-GYRUS) N/A 10/29/2014   Procedure: TRANSURETHRAL RESECTION OF BLADDER TUMOR WITH GYRUS (TURBT-GYRUS);  Surgeon: Alexis Frock, MD;  Location: [facility];  Service: Urology;  Laterality: N/A;  . TRANSURETHRAL RESECTION OF BLADDER TUMOR WITH GYRUS (TURBT-GYRUS) N/A 09/10/2015   Procedure: TRANSURETHRAL RESECTION OF BLADDER TUMOR ;  Surgeon: Alexis Frock, MD;  Location: [facility];  Service: Urology;  Laterality: N/A;  . TRANSURETHRAL RESECTION OF  PROSTATE  2006   AND    POST CAURTERIZATION OF BLEEDERS  . TRANSURETHRAL RESECTION OF PROSTATE N/A 09/10/2015   Procedure: TRANSURETHRAL RESECTION OF THE PROSTATE ;  Surgeon: Alexis Frock, MD;  Location: [facility];  Service: Urology;  Laterality: N/A;       Home Medications    Prior to Admission medications   Medication Sig Start Date End Date Taking? Authorizing Provider  acetaminophen (TYLENOL) 650 MG CR tablet Take 1,300 mg by mouth every 8 (eight) hours as needed for pain.   Yes [provider]  allopurinol (ZYLOPRIM) 100 MG tablet Take 100 mg by mouth daily.    Yes [provider]  calcium-vitamin D (OSCAL WITH D) 500-200 MG-UNIT tablet Take 1 tablet by mouth daily with breakfast.   Yes [provider]  ferrous sulfate 325 (65 FE) MG tablet Take 1 tablet (325 mg total) by mouth 3 (three) times daily with meals. Patient taking differently: Take 325 mg by mouth 2 (two) times daily with a meal.  01/04/17  Yes Short, Noah Delaine, MD  furosemide (LASIX) 40 MG tablet Take 40 mg by mouth 2 (two) times daily.   Yes [provider]  metoprolol (  LOPRESSOR) 50 MG tablet Take 50 mg by mouth daily.   Yes [provider]  omeprazole (PRILOSEC) 20 MG capsule Take 20 mg by mouth daily.    Yes [provider]  polyethylene glycol (MIRALAX / GLYCOLAX) packet Take 17 g by mouth daily. 01/05/17  Yes Short, Noah Delaine, MD  potassium chloride SA (K-DUR,KLOR-CON) 20 MEQ tablet Take 20 mEq by mouth daily.   Yes [provider]  pravastatin (PRAVACHOL) 80 MG tablet Take 80 mg by mouth daily.    Yes [provider]  HYDROcodone-acetaminophen (NORCO) 5-325 MG tablet Take 1-2 tablets by mouth every 6 (six) hours as needed for moderate pain or severe pain. Patient not taking: Reported on 05/04/2017 02/01/17   Debbrah Alar, PA-C    Family History Family History  Problem Relation Age of Onset  . Congestive Heart Failure Mother   .  Bladder Cancer Neg Hx     Social History Social History  Substance Use Topics  . Smoking status: Former Smoker    Packs/day: 1.50    Years: 35.00    Types: Cigarettes    Quit date: 11/28/1985  . Smokeless tobacco: Never Used  . Alcohol use 2.4 oz/week    4 Standard drinks or equivalent per week     Comment: seldom     Allergies   Bee venom; Penicillins; and Tramadol   Review of Systems Review of Systems  Respiratory: Positive for cough and shortness of breath.   Cardiovascular: Positive for leg swelling. Negative for chest pain.  Genitourinary: Negative for dysuria.  Neurological: Positive for weakness. Negative for headaches.  Psychiatric/Behavioral: Positive for confusion.     Physical Exam Updated Vital Signs BP 130/69 (BP Location: Left Arm)   Pulse 80   Temp 98.7 F (37.1 C) (Oral)   Resp 18   Ht 6' (1.829 m)   Wt 130 kg (286 lb 9.6 oz)   SpO2 97%   BMI 38.87 kg/m   Physical Exam  Constitutional: He is oriented to person, place, and time. He appears well-developed and well-nourished.  HENT:  Head: Normocephalic and atraumatic.  Right Ear: External ear normal.  Left Ear: External ear normal.  Nose: Nose normal.  Eyes: Right eye exhibits no discharge. Left eye exhibits no discharge.  Neck: Neck supple.  Cardiovascular: Regular rhythm and normal heart sounds.  Tachycardia present.   Pulses:      Dorsalis pedis pulses are 2+ on the right side, and 2+ on the left side.  HR low 100  Pulmonary/Chest: Effort normal. He has rales.  Abdominal: Soft. There is no tenderness.  Musculoskeletal: He exhibits edema (BLE).  Neurological: He is alert and oriented to person, place, and time.  Awake, alert, oriented x 4. CN 3-12 grossly intact. 5/5 strength in RUE, RLE. 4/5 strength LLE. 3/5 strength RLE. Grossly normal sensation.   Skin: Skin is warm and dry. He is not diaphoretic.  Nursing note and vitals reviewed.    ED Treatments / Results  Labs (all labs  ordered are listed, but only abnormal results are displayed) Labs Reviewed  CBC WITH DIFFERENTIAL/PLATELET - Abnormal; Notable for the following:       Result Value   RBC 3.09 (*)    Hemoglobin 9.1 (*)    HCT 28.8 (*)    All other components within normal limits  COMPREHENSIVE METABOLIC PANEL - Abnormal; Notable for the following:    Chloride 98 (*)    Glucose, Bld 124 (*)    BUN 23 (*)  Total Protein 6.4 (*)    Albumin 2.8 (*)    Alkaline Phosphatase 359 (*)    All other components within normal limits  PROTIME-INR - Abnormal; Notable for the following:    Prothrombin Time 16.2 (*)    All other components within normal limits  APTT - Abnormal; Notable for the following:    aPTT 38 (*)    All other components within normal limits  URINALYSIS, ROUTINE W REFLEX MICROSCOPIC - Abnormal; Notable for the following:    Hgb urine dipstick SMALL (*)    Leukocytes, UA LARGE (*)    Bacteria, UA RARE (*)    Squamous Epithelial / LPF 0-5 (*)    All other components within normal limits  BRAIN NATRIURETIC PEPTIDE - Abnormal; Notable for the following:    B Natriuretic Peptide 113.6 (*)    All other components within normal limits  RAPID URINE DRUG SCREEN, HOSP PERFORMED  TROPONIN I  BASIC METABOLIC PANEL    EKG  EKG Interpretation  Date/Time:  Thursday May 04 2017 18:29:55 EDT Ventricular Rate:  98 PR Interval:    QRS Duration: 108 QT Interval:  359 QTC Calculation: 459 R Axis:   -45 Text Interpretation:  Sinus tachycardia Atrial premature complex Incomplete RBBB and LAFB Abnormal R-wave progression, early transition Consider anterior infarct artifact/baseline wander in V3-6 limits interpretation Confirmed by Sherwood Gambler [phone]) on 05/04/2017 7:40:15 PM       Radiology Dg Nasal Bones  Result Date: 05/04/2017 CLINICAL DATA:  Question nasal metallic foreign body. EXAM: NASAL BONES - 3+ VIEW COMPARISON:  Face CT earlier this day. FINDINGS: There are 2 metallic foreign bodies  projecting over the right and left nasal region, which are in the soft tissues on CT. No nasal bone fracture. IMPRESSION: Tiny metallic densities project over the right and left nasal region, which corresponds soft tissue foreign bodies on CT. Electronically Signed   By: Jeb Levering M.D.   On: 05/04/2017 22:40   Dg Chest 2 View  Result Date: 05/04/2017 CLINICAL DATA:  Dyspnea cough. EXAM: CHEST  2 VIEW COMPARISON:  01/01/2017 FINDINGS: AP and lateral views of the chest show low volumes with cardiomegaly. There is vascular congestion with diffuse interstitial opacity compatible with edema. Right base atelectasis or infiltrate noted. Small right pleural effusion. The visualized bony structures of the thorax are intact. Telemetry leads overlie the chest. IMPRESSION: Cardiomegaly with interstitial pulmonary edema. Right base atelectasis with small right pleural effusion. Electronically Signed   By: Misty Stanley M.D.   On: 05/04/2017 20:41   Ct Head Wo Contrast  Result Date: 05/04/2017 CLINICAL DATA:  Increased weakness with multiple falls, swelling and bruising to the occipital and right zygomatic arch region EXAM: CT HEAD WITHOUT CONTRAST CT MAXILLOFACIAL WITHOUT CONTRAST TECHNIQUE: Multidetector CT imaging of the head and maxillofacial structures were performed using the standard protocol without intravenous contrast. Multiplanar CT image reconstructions of the maxillofacial structures were also generated. COMPARISON:  None. FINDINGS: CT HEAD FINDINGS Brain: No acute territorial infarction, hemorrhage or intracranial mass is seen. Old encephalomalacia involving the right parietal and temporal lobes consistent with old infarct. Moderate periventricular and subcortical white matter small vessel ischemic changes. Moderate atrophy. Prominent ventricles are felt secondary to atrophy. No midline shift. Vascular: No hyperdense vessels.  Carotid artery calcifications. Skull: No fracture or suspicious bone lesion.  Prominent arachnoid granulation in the sub occipital bone on the right. Other: Partially visualize mass surrounding the lateral aspect of the right maxillary sinus and the  anterior aspect of the zygoma. CT MAXILLOFACIAL FINDINGS Osseous: Mandibular heads are normally position. No mandibular fracture. Zygomatic arches and pterygoid plates are intact. No acute nasal bone fracture. Mild periosteal change involving the anterior zygoma and lateral wall of the right maxillary sinus. Orbits: No orbital wall fracture. No intra or extraconal soft tissue abnormality Sinuses: No acute fluid levels. Mild mucosal thickening in the maxillary and ethmoid sinuses. No sinus wall fracture. Soft tissues: Small metallic densities in the right and left nasal superficial soft tissues. Soft tissue mass centered at the junction of the right maxillary sinus and anterior zygoma, this measures 3.2 by 1.8 cm. IMPRESSION: 1. No CT evidence for acute intracranial abnormality. 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 Moderate white matter small vessel ischemic changes 2. No acute facial bone fracture. 3. 3.2 cm mass surrounding the junction of the right anterolateral maxillary sinus and the anterior aspect of the zygomatic arch with underlying mild periostitis. Findings could relate to metastatic lesion or primary bone tumor. Further evaluation with MRI is recommended. 4. Superficial metallic densities in the right and left nasal soft tissues for which clinical correlation is recommended Electronically Signed   By: Donavan Foil M.D.   On: 05/04/2017 21:29   Ct Maxillofacial Wo Contrast  Result Date: 05/04/2017 CLINICAL DATA:  Increased weakness with multiple falls, swelling and bruising to the occipital and right zygomatic arch region EXAM: CT HEAD WITHOUT CONTRAST CT MAXILLOFACIAL WITHOUT CONTRAST TECHNIQUE: Multidetector CT imaging of the head and maxillofacial structures were performed using the standard protocol without  intravenous contrast. Multiplanar CT image reconstructions of the maxillofacial structures were also generated. COMPARISON:  None. FINDINGS: CT HEAD FINDINGS Brain: No acute territorial infarction, hemorrhage or intracranial mass is seen. Old encephalomalacia involving the right parietal and temporal lobes consistent with old infarct. Moderate periventricular and subcortical white matter small vessel ischemic changes. Moderate atrophy. Prominent ventricles are felt secondary to atrophy. No midline shift. Vascular: No hyperdense vessels.  Carotid artery calcifications. Skull: No fracture or suspicious bone lesion. Prominent arachnoid granulation in the sub occipital bone on the right. Other: Partially visualize mass surrounding the lateral aspect of the right maxillary sinus and the anterior aspect of the zygoma. CT MAXILLOFACIAL FINDINGS Osseous: Mandibular heads are normally position. No mandibular fracture. Zygomatic arches and pterygoid plates are intact. No acute nasal bone fracture. Mild periosteal change involving the anterior zygoma and lateral wall of the right maxillary sinus. Orbits: No orbital wall fracture. No intra or extraconal soft tissue abnormality Sinuses: No acute fluid levels. Mild mucosal thickening in the maxillary and ethmoid sinuses. No sinus wall fracture. Soft tissues: Small metallic densities in the right and left nasal superficial soft tissues. Soft tissue mass centered at the junction of the right maxillary sinus and anterior zygoma, this measures 3.2 by 1.8 cm. IMPRESSION: 1. No CT evidence for acute intracranial abnormality. 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 Moderate white matter small vessel ischemic changes 2. No acute facial bone fracture. 3. 3.2 cm mass surrounding the junction of the right anterolateral maxillary sinus and the anterior aspect of the zygomatic arch with underlying mild periostitis. Findings could relate to metastatic lesion or primary bone  tumor. Further evaluation with MRI is recommended. 4. Superficial metallic densities in the right and left nasal soft tissues for which clinical correlation is recommended Electronically Signed   By: Donavan Foil M.D.   On: 05/04/2017 21:29    Procedures Procedures (including critical care time)  Medications Ordered in ED Medications  calcium-vitamin D (OSCAL WITH D) 500-200 MG-UNIT per tablet 1 tablet (not administered)  metoprolol tartrate (LOPRESSOR) tablet 50 mg (not administered)  ferrous sulfate tablet 325 mg (not administered)  polyethylene glycol (MIRALAX / GLYCOLAX) packet 17 g (not administered)  pantoprazole (PROTONIX) EC tablet 40 mg (not administered)  allopurinol (ZYLOPRIM) tablet 100 mg (not administered)  pravastatin (PRAVACHOL) tablet 80 mg (not administered)  sodium chloride flush (NS) 0.9 % injection 3 mL (3 mLs Intravenous Given 05/04/17 2315)  sodium chloride flush (NS) 0.9 % injection 3 mL (not administered)  0.9 %  sodium chloride infusion (not administered)  acetaminophen (TYLENOL) tablet 650 mg (650 mg Oral Given 05/05/17 0025)  ondansetron (ZOFRAN) injection 4 mg (not administered)  enoxaparin (LOVENOX) injection 40 mg (40 mg Subcutaneous Given 05/05/17 0025)  furosemide (LASIX) injection 40 mg (not administered)  fentaNYL (SUBLIMAZE) injection 25-50 mcg (not administered)  HYDROcodone-acetaminophen (NORCO/VICODIN) 5-325 MG per tablet 1 tablet (not administered)  furosemide (LASIX) injection 40 mg (40 mg Intravenous Given 05/05/17 0025)     Initial Impression / Assessment and Plan / ED Course  I have reviewed the triage vital signs and the nursing notes.  Pertinent labs & imaging results that were available during my care of the patient were reviewed by me and considered in my medical decision making (see chart for details).     Patient does appear to have pulmonary edema. Started on Lasix. He is not in distress. Weakness onset is not totally clear but seems to  be in the last couple days. On my exam it is worse in his right lower extremity compared to the left. He is not in a code stroke window. Urinalysis pending at this time. My exam he is not acutely confused or delirious. Discussed with Dr. Leonel Ramsay, given some concern for stroke, will admit to the hospitalist service at [facility]. With his metastatic disease and focally lower extremity symptoms he probably needs MRI of his lumbar and thoracic spine in addition to his brain. He is currently stable. Patient will be transferred to [facility] for admission.  Final Clinical Impressions(s) / ED Diagnoses   Final diagnoses:  Acute congestive heart failure, unspecified heart failure type [facility]  Acute weakness    New Prescriptions Current Discharge Medication List       Sherwood Gambler, MD 05/05/17 [phone]

## 2017-05-04 NOTE — ED Triage Notes (Signed)
Pt has had increased weakness since 04/29/2017. Pt has had multiple falls at home from [facility] with exertion.Pt presents with swelling and bruising to mid occipital and right zygomatic. Pt recently had kidney removed March 2018. Per pt daughter pt eyes are protruding, ShoB is worse and BLE swelling is worse since last seeing pt on Sat.

## 2017-05-04 NOTE — ED Notes (Signed)
Pt urinated but it was not contained.  Rn notified.  Pt given urinal at bediside.

## 2017-05-04 NOTE — Consult Note (Signed)
Neurology Consultation Reason for Consult: Leg weakness Referring Physician: Opyd, T  CC: Leg weakness  History is obtained from: Patient  HPI: Victor Wang is a 82 y.o. male who has been having progressive weakness of his legs and generalized weakness over the past few weeks, but much more acutely since this past weekend. He denies numbness, but states that his legs and arms all feel weak. He has fallen multiple times. He has significant lower extremity edema. He endorses mid thoracic back pain extending down to his lumbar area.  He has a history of bladder cancer which has been metastatic, and he has a facial swelling which is relatively new and on CT it appears to be a metastatic lesion.    ROS: A 14 point ROS was performed and is negative except as noted in the HPI.   Past Medical History:  Diagnosis Date  . Anemia   . Arthritis   . Bladder cancer (HCC)    RECURRENT  . BPH (benign prostatic hypertrophy)   . CHF (congestive heart failure) ([place])   . Clot 2007    kidney biopsy done  . Complication of anesthesia    oxygen level drops when put to sleep , has some bleeding issues after every surgery with cautery required  . COPD (chronic obstructive pulmonary disease) ([place])   . Dyspnea    exertion; can not complete a flight of stairs without stopping to catch his breath   . Full dentures   . GERD (gastroesophageal reflux disease)   . History of blood transfusion last done 11-13-16   total of 7 units   . History of compression fracture of spine    04/2013   T12  . Hyperlipidemia   . Hypertension   . Nephritis 2007   oral chemo and prednisone done  . Pneumonia last 2016   x 2 in 2016  . PONV (postoperative nausea and vomiting)   . Pulmonary hypertension ([place])    mild per 12-15-15 echo [facility]  . Renal mass    right  . Sleep apnea      Family History  Problem Relation Age of Onset  . Congestive Heart Failure Mother   . Bladder Cancer Neg Hx       Social History:  reports that he quit smoking about 31 years ago. His smoking use included Cigarettes. He has a 52.50 pack-year smoking history. He has never used smokeless tobacco. He reports that he drinks about 2.4 oz of alcohol per week . He reports that he does not use drugs.   Exam: Current vital signs: BP (!) 118/59 (BP Location: Left Arm)   Pulse (!) 105   Temp 98.4 F (36.9 C) (Oral)   Resp 17   Ht 6' (1.829 m)   Wt 130 kg (286 lb 11.2 oz)   SpO2 95%   BMI 38.88 kg/m  Vital signs in last 24 hours: Temp:  [98.4 F (36.9 C)] 98.4 F (36.9 C) (06/07 1840) Pulse Rate:  [101-105] 105 (06/07 2241) Resp:  [16-17] 17 (06/07 2241) BP: (118)/(59) 118/59 (06/07 2241) SpO2:  [95 %-97 %] 95 % (06/07 2241) Weight:  [130 kg (286 lb 11.2 oz)] 130 kg (286 lb 11.2 oz) (06/07 1839)   Physical Exam  Constitutional: Obese Psych: Affect appropriate to situation Eyes: No scleral injection HENT: No OP obstrucion Head: Normocephalic.  Cardiovascular: Normal rate and regular rhythm.  Respiratory: Effort normal GI: Soft.  No distension. There is no tenderness.  Skin: WDI  Neuro: Mental Status: Patient is awake, alert, oriented to person, place, month, year, and situation. Patient is able to give a clear and coherent history. No signs of aphasia or neglect Cranial Nerves: II: Visual Fields are full. Pupils are equal, round, and reactive to light.   III,IV, VI: EOMI without ptosis or diploplia.  V: Facial sensation is symmetric to temperature VII: Facial movement is symmetric.  VIII: hearing is intact to voice X: Uvula elevates symmetrically XI: Shoulder shrug is symmetric. XII: tongue is midline without atrophy or fasciculations.  Motor: Tone is normal. Bulk is normal. He has good strength with no drift in bilateral upper extremities, and his lower extremities he has full plantar/dorsiflexion bilaterally, some possible mild weakness of knee extension on the right, significant  hip flexion weakness, 2/5 on the right Sensory: Sensation is intact to light touch Deep Tendon Reflexes: 2+ and symmetric in the patellae and decreased in the ankles bilaterally Cerebellar: FNF and HKS are intact bilaterally  I have reviewed labs in epic and the results pertinent to this consultation are: CMP-unremarkable  I have reviewed the images obtained: CT head-no acute intracranial findings  Impression: 82 year old male with history of bladder cancer with findings concerning for metastatic disease. His hip flexion weakness could be related to metastatic disease, and I would favor getting an MRI of his lumbar and thoracic spines. If there is no explanation found here, then could consider MRI brain and cervical spine as well. There are asymmetric findings, however given his general medical condition it is difficult to know if this is truly significant or not.  Recommendations: 1) MRI T and L -spine 2) PT   Roland Rack, MD [facility] [phone]  If 7pm- 7am, please page neurology on call as listed in [place].

## 2017-05-04 NOTE — H&P (Signed)
History and Physical    YERICK EGGEBRECHT DQQ:[id number] DOB: 1935-09-05 DOA: 05/04/2017  PCP: Townsend Roger, MD   Patient coming from: Home  Chief Complaint: Weakness with falls, leg swelling, DOE  HPI: Victor Wang is a 82 y.o. male with medical history significant for chronic CHF and recurrent urothelial carcinoma status post right nephroureterectomy in March 2018, now presenting to the emergency department with increasing weakness and falls, progressive lower extremity edema, and shortness of breath. Patient is accompanied by his wife and daughter who assist with the history. Patient has been noted to grow increasingly weak, generally, over the past couple weeks, but much more acutely since 04/29/2017. He has had multiple falls since that time, but none with head injury or loss of consciousness. He reports weakness involving all of his extremities, but with legs more so than arms, and perhaps the right leg more so than the left. He denies any numbness, denies headache, and denies change in vision or hearing. His Lasix had been held for the last several days due to increasing weakness, but he has since developed progressive lower extremity edema and shortness of breath. Also concerning is an enlarging nodule under the right eye, first noted a couple weeks ago and growing. Patient reports that it is tender to palpation, but otherwise does not bother him.     ED Course: Upon arrival to the ED, patient is found to be afebrile, saturating adequately on 3 L/m supplemental oxygen, slightly tachycardic, and was stable blood pressure. EKG features sinus rhythm with PAC and incomplete RBBB and LAFB. Chest x-ray is notable for cardiomegaly with interstitial pulmonary edema and a small right-sided pleural effusion. Chemistry panel is notable for an elevated BUN to creatinine ratio. CBC features a stable normocytic anemia with hemoglobin of 9.1. INR is elevated to 1.29, troponin is undetectable, and BNP  is mildly elevated 214. Noncontrast head CT is negative for acute intracranial abnormality, but maxillofacial CT demonstrates a 3.2 cm mass at the junction of the right anterolateral maxillary sinus and anterior aspect of the zygomatic arch with mild periostitis and concerning for possible primary bone tumor or a metastasis. Neurology was consulted by the ED physician and has evaluated the patient in the emergency department. Patient remained hemodynamically stable and has not been in acute respiratory distress. He will be admitted to the telemetry unit for ongoing evaluation and management of acute on chronic CHF and generalized weakness, with increased weakness involving the right leg in the presence of new worsening back pain concerning for metastatic disease.  Review of Systems:  All other systems reviewed and apart from HPI, are negative.  Past Medical History:  Diagnosis Date  . Anemia   . Arthritis   . Bladder cancer (HCC)    RECURRENT  . BPH (benign prostatic hypertrophy)   . CHF (congestive heart failure) ([place])   . Clot 2007    kidney biopsy done  . Complication of anesthesia    oxygen level drops when put to sleep , has some bleeding issues after every surgery with cautery required  . COPD (chronic obstructive pulmonary disease) ([place])   . Dyspnea    exertion; can not complete a flight of stairs without stopping to catch his breath   . Full dentures   . GERD (gastroesophageal reflux disease)   . History of blood transfusion last done 11-13-16   total of 7 units   . History of compression fracture of spine    04/2013   T12  .  Hyperlipidemia   . Hypertension   . Nephritis 2007   oral chemo and prednisone done  . Pneumonia last 2016   x 2 in 2016  . PONV (postoperative nausea and vomiting)   . Pulmonary hypertension ([place])    mild per 12-15-15 echo [facility]  . Renal mass    right  . Sleep apnea     Past Surgical History:  Procedure Laterality Date  .  APPENDECTOMY  1985  . CARPAL TUNNEL RELEASE Right 02-05-2008  . CYSTOSCOPY W/ RETROGRADES Bilateral 10/29/2014   Procedure: CYSTOSCOPY WITH RETROGRADE PYELOGRAM;  Surgeon: Alexis Frock, MD;  Location: [facility] [place];  Service: Urology;  Laterality: Bilateral;  . CYSTOSCOPY W/ RETROGRADES Bilateral 09/10/2015   Procedure: CYSTOSCOPY WITH RETROGRADE PYELOGRAM;  Surgeon: Alexis Frock, MD;  Location: [facility];  Service: Urology;  Laterality: Bilateral;  . CYSTOSCOPY W/ URETERAL STENT PLACEMENT N/A 03/29/2017   Procedure: CYSTOSCOPY WITH CLOT EVACUATION transurethral resection bladder tumor retrograde pylegram left ureter;  Surgeon: Alexis Frock, MD;  Location: [facility];  Service: Urology;  Laterality: N/A;  . CYSTOSCOPY WITH BIOPSY N/A 05/26/2014   Procedure: CYSTOSCOPY WITH BIOPSY;  Surgeon: Sharyn Creamer, MD;  Location: [facility];  Service: Urology;  Laterality: N/A;  . CYSTOSCOPY WITH RETROGRADE PYELOGRAM, URETEROSCOPY AND STENT PLACEMENT Bilateral 04/09/2014   Procedure: CYSTOSCOPY WITH RETROGRADE PYELOGRAM, POSSIBLE URETEROSCOPY WITH BIOPSY AND STENT PLACEMENT;  Surgeon: Sharyn Creamer, MD;  Location: [facility];  Service: Urology;  Laterality: Bilateral;  . CYSTOSCOPY WITH RETROGRADE PYELOGRAM, URETEROSCOPY AND STENT PLACEMENT Right 02/01/2017   Procedure: CYSTOSCOPY WITH RIGHT  RETROGRADE PYELOGRAM  AND RIGHT STENT PLACEMENT;  Surgeon: Alexis Frock, MD;  Location: [facility];  Service: Urology;  Laterality: Right;  . CYSTOSCOPY/RETROGRADE/URETEROSCOPY Right 12/28/2016   Procedure: CYSTOSCOPY/RETROGRADE/URETEROSCOPY WITH BIOPSY right renal pelvis insertion double j stent;  Surgeon: Alexis Frock, MD;  Location: [facility];  Service: Urology;  Laterality: Right;  . KNEE LIGAMENT RECONSTRUCTION Right 1970  . RIGHT SHOULDER SURGERY  2011  . ROBOT ASSITED LAPAROSCOPIC NEPHROURETERECTOMY Right 02/01/2017   Procedure: XI ROBOT ASSITED  LAPAROSCOPIC NEPHROURETERECTOMY;  Surgeon: Alexis Frock, MD;  Location: [facility];  Service: Urology;  Laterality: Right;  . TONSILLECTOMY AND ADENOIDECTOMY  CHILD   and adenoids  . TRANSURETHRAL RESECTION OF BLADDER TUMOR WITH GYRUS (TURBT-GYRUS) Bilateral 04/09/2014   Procedure: TRANSURETHRAL RESECTION OF BLADDER TUMOR WITH GYRUS (TURBT-GYRUS);  Surgeon: Sharyn Creamer, MD;  Location: [facility];  Service: Urology;  Laterality: Bilateral;  . TRANSURETHRAL RESECTION OF BLADDER TUMOR WITH GYRUS (TURBT-GYRUS) N/A 10/29/2014   Procedure: TRANSURETHRAL RESECTION OF BLADDER TUMOR WITH GYRUS (TURBT-GYRUS);  Surgeon: Alexis Frock, MD;  Location: [facility];  Service: Urology;  Laterality: N/A;  . TRANSURETHRAL RESECTION OF BLADDER TUMOR WITH GYRUS (TURBT-GYRUS) N/A 09/10/2015   Procedure: TRANSURETHRAL RESECTION OF BLADDER TUMOR ;  Surgeon: Alexis Frock, MD;  Location: [facility];  Service: Urology;  Laterality: N/A;  . TRANSURETHRAL RESECTION OF PROSTATE  2006   AND    POST CAURTERIZATION OF BLEEDERS  . TRANSURETHRAL RESECTION OF PROSTATE N/A 09/10/2015   Procedure: TRANSURETHRAL RESECTION OF THE PROSTATE ;  Surgeon: Alexis Frock, MD;  Location: [facility];  Service: Urology;  Laterality: N/A;     reports that he quit smoking about 31 years ago. His smoking use included Cigarettes. He has a 52.50 pack-year smoking history. He has never used smokeless tobacco. He reports that he drinks  about 2.4 oz of alcohol per week . He reports that he does not use drugs.  Allergies  Allergen Reactions  . Bee Venom Anaphylaxis  . Penicillins Anaphylaxis, Hives and Other (See Comments)    Has patient had a PCN reaction causing immediate rash, facial/tongue/throat swelling, SOB or lightheadedness with hypotension: Yes Has patient had a PCN reaction causing severe rash involving mucus membranes or skin necrosis: Yes Has patient had a PCN reaction  that required hospitalization No Has patient had a PCN reaction occurring within the last 10 years: No If all of the above answers are "NO", then may proceed with Cephalosporin use.   . Tramadol Other (See Comments)    Dizzy and loopy.     Family History  Problem Relation Age of Onset  . Congestive Heart Failure Mother   . Bladder Cancer Neg Hx      Prior to Admission medications   Medication Sig Start Date End Date Taking? Authorizing Provider  acetaminophen (TYLENOL) 650 MG CR tablet Take 1,300 mg by mouth every 8 (eight) hours as needed for pain.   Yes [provider]  allopurinol (ZYLOPRIM) 100 MG tablet Take 100 mg by mouth daily.    Yes [provider]  calcium-vitamin D (OSCAL WITH D) 500-200 MG-UNIT tablet Take 1 tablet by mouth daily with breakfast.   Yes [provider]  ferrous sulfate 325 (65 FE) MG tablet Take 1 tablet (325 mg total) by mouth 3 (three) times daily with meals. Patient taking differently: Take 325 mg by mouth 2 (two) times daily with a meal.  01/04/17  Yes Short, Noah Delaine, MD  furosemide (LASIX) 40 MG tablet Take 40 mg by mouth 2 (two) times daily.   Yes [provider]  metoprolol (LOPRESSOR) 50 MG tablet Take 50 mg by mouth daily.   Yes [provider]  omeprazole (PRILOSEC) 20 MG capsule Take 20 mg by mouth daily.    Yes [provider]  polyethylene glycol (MIRALAX / GLYCOLAX) packet Take 17 g by mouth daily. 01/05/17  Yes Short, Noah Delaine, MD  potassium chloride SA (K-DUR,KLOR-CON) 20 MEQ tablet Take 20 mEq by mouth daily.   Yes [provider]  pravastatin (PRAVACHOL) 80 MG tablet Take 80 mg by mouth daily.    Yes [provider]  HYDROcodone-acetaminophen (NORCO) 5-325 MG tablet Take 1-2 tablets by mouth every 6 (six) hours as needed for moderate pain or severe pain. Patient not taking: Reported on 05/04/2017 02/01/17   Debbrah Alar, PA-C    Physical Exam: Vitals:   05/04/17 1839  05/04/17 1840 05/04/17 2241  BP:   (!) 118/59  Pulse:  (!) 101 (!) 105  Resp:  16 17  Temp:  98.4 F (36.9 C)   TempSrc:  Oral   SpO2:  97% 95%  Weight: 130 kg (286 lb 11.2 oz)    Height: 6' (1.829 m)        Constitutional: No acute distress. Dyspneic with speech. Nodule inferior to right eye with overlying faint ecchymosis.  Eyes: PERTLA, lids and conjunctivae normal ENMT: Mucous membranes are moist. Posterior pharynx clear of any exudate or lesions.   Neck: normal, supple, no masses, no thyromegaly Respiratory: Crackles appreciated bilaterally, no wheezing, no rhonchi. No accessory muscle use.  Cardiovascular: S1 & S2 heard, regular rate and rhythm. Bilateral leg edema to thighs. JVP difficult to visualize. Abdomen: No distension, no tenderness, soft. Bowel sounds normal.  Musculoskeletal: no clubbing / cyanosis. No joint deformity upper and lower  extremities. Mildly tender, firm, fixed nodule inferior to right eye.   Skin: no significant rashes, lesions, ulcers. Warm, dry, well-perfused. Neurologic: CN 2-12 grossly intact. Sensation intact, patellar DTR normal. Strength is diminished globally.  Psychiatric: Alert and oriented x 3. Pleasant and cooperative.     Labs on Admission: I have personally reviewed following labs and imaging studies  CBC:  Recent Labs Lab 05/04/17 1859  WBC 7.2  NEUTROABS 6.0  HGB 9.1*  HCT 28.8*  MCV 93.2  PLT 295   Basic Metabolic Panel:  Recent Labs Lab 05/04/17 1859  NA 135  K 4.2  CL 98*  CO2 29  GLUCOSE 124*  BUN 23*  CREATININE 0.98  CALCIUM 10.3   GFR: Estimated Creatinine Clearance: 85.2 mL/min (by C-G formula based on SCr of 0.98 mg/dL). Liver Function Tests:  Recent Labs Lab 05/04/17 1859  AST 39  ALT 36  ALKPHOS 359*  BILITOT 1.1  PROT 6.4*  ALBUMIN 2.8*   No results for input(s): LIPASE, AMYLASE in the last 168 hours. No results for input(s): AMMONIA in the last 168 hours. Coagulation Profile:  Recent  Labs Lab 05/04/17 1859  INR 1.29   Cardiac Enzymes:  Recent Labs Lab 05/04/17 1859  TROPONINI <0.03   BNP (last 3 results) No results for input(s): PROBNP in the last 8760 hours. HbA1C: No results for input(s): HGBA1C in the last 72 hours. CBG: No results for input(s): GLUCAP in the last 168 hours. Lipid Profile: No results for input(s): CHOL, HDL, LDLCALC, TRIG, CHOLHDL, LDLDIRECT in the last 72 hours. Thyroid Function Tests: No results for input(s): TSH, T4TOTAL, FREET4, T3FREE, THYROIDAB in the last 72 hours. Anemia Panel: No results for input(s): VITAMINB12, FOLATE, FERRITIN, TIBC, IRON, RETICCTPCT in the last 72 hours. Urine analysis:    Component Value Date/Time   COLORURINE YELLOW 05/04/2017 2210   APPEARANCEUR CLEAR 05/04/2017 2210   LABSPEC 1.017 05/04/2017 2210   PHURINE 5.0 05/04/2017 2210   GLUCOSEU NEGATIVE 05/04/2017 2210   HGBUR SMALL (A) 05/04/2017 2210   BILIRUBINUR NEGATIVE 05/04/2017 2210   KETONESUR NEGATIVE 05/04/2017 2210   PROTEINUR NEGATIVE 05/04/2017 2210   NITRITE NEGATIVE 05/04/2017 2210   LEUKOCYTESUR LARGE (A) 05/04/2017 2210   Sepsis Labs: @LABRCNTIP (procalcitonin:4,lacticidven:4) )No results found for this or any previous visit (from the past 240 hour(s)).   Radiological Exams on Admission: Dg Nasal Bones  Result Date: 05/04/2017 CLINICAL DATA:  Question nasal metallic foreign body. EXAM: NASAL BONES - 3+ VIEW COMPARISON:  Face CT earlier this day. FINDINGS: There are 2 metallic foreign bodies projecting over the right and left nasal region, which are in the soft tissues on CT. No nasal bone fracture. IMPRESSION: Tiny metallic densities project over the right and left nasal region, which corresponds soft tissue foreign bodies on CT. Electronically Signed   By: Jeb Levering M.D.   On: 05/04/2017 22:40   Dg Chest 2 View  Result Date: 05/04/2017 CLINICAL DATA:  Dyspnea cough. EXAM: CHEST  2 VIEW COMPARISON:  01/01/2017 FINDINGS: AP and  lateral views of the chest show low volumes with cardiomegaly. There is vascular congestion with diffuse interstitial opacity compatible with edema. Right base atelectasis or infiltrate noted. Small right pleural effusion. The visualized bony structures of the thorax are intact. Telemetry leads overlie the chest. IMPRESSION: Cardiomegaly with interstitial pulmonary edema. Right base atelectasis with small right pleural effusion. Electronically Signed   By: Misty Stanley M.D.   On: 05/04/2017 20:41   Ct Head Wo Contrast  Result Date: 05/04/2017 CLINICAL DATA:  Increased weakness with multiple falls, swelling and bruising to the occipital and right zygomatic arch region EXAM: CT HEAD WITHOUT CONTRAST CT MAXILLOFACIAL WITHOUT CONTRAST TECHNIQUE: Multidetector CT imaging of the head and maxillofacial structures were performed using the standard protocol without intravenous contrast. Multiplanar CT image reconstructions of the maxillofacial structures were also generated. COMPARISON:  None. FINDINGS: CT HEAD FINDINGS Brain: No acute territorial infarction, hemorrhage or intracranial mass is seen. Old encephalomalacia involving the right parietal and temporal lobes consistent with old infarct. Moderate periventricular and subcortical white matter small vessel ischemic changes. Moderate atrophy. Prominent ventricles are felt secondary to atrophy. No midline shift. Vascular: No hyperdense vessels.  Carotid artery calcifications. Skull: No fracture or suspicious bone lesion. Prominent arachnoid granulation in the sub occipital bone on the right. Other: Partially visualize mass surrounding the lateral aspect of the right maxillary sinus and the anterior aspect of the zygoma. CT MAXILLOFACIAL FINDINGS Osseous: Mandibular heads are normally position. No mandibular fracture. Zygomatic arches and pterygoid plates are intact. No acute nasal bone fracture. Mild periosteal change involving the anterior zygoma and lateral wall of  the right maxillary sinus. Orbits: No orbital wall fracture. No intra or extraconal soft tissue abnormality Sinuses: No acute fluid levels. Mild mucosal thickening in the maxillary and ethmoid sinuses. No sinus wall fracture. Soft tissues: Small metallic densities in the right and left nasal superficial soft tissues. Soft tissue mass centered at the junction of the right maxillary sinus and anterior zygoma, this measures 3.2 by 1.8 cm. IMPRESSION: 1. No CT evidence for acute intracranial abnormality. 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 Moderate white matter small vessel ischemic changes 2. No acute facial bone fracture. 3. 3.2 cm mass surrounding the junction of the right anterolateral maxillary sinus and the anterior aspect of the zygomatic arch with underlying mild periostitis. Findings could relate to metastatic lesion or primary bone tumor. Further evaluation with MRI is recommended. 4. Superficial metallic densities in the right and left nasal soft tissues for which clinical correlation is recommended Electronically Signed   By: Donavan Foil M.D.   On: 05/04/2017 21:29   Ct Maxillofacial Wo Contrast  Result Date: 05/04/2017 CLINICAL DATA:  Increased weakness with multiple falls, swelling and bruising to the occipital and right zygomatic arch region EXAM: CT HEAD WITHOUT CONTRAST CT MAXILLOFACIAL WITHOUT CONTRAST TECHNIQUE: Multidetector CT imaging of the head and maxillofacial structures were performed using the standard protocol without intravenous contrast. Multiplanar CT image reconstructions of the maxillofacial structures were also generated. COMPARISON:  None. FINDINGS: CT HEAD FINDINGS Brain: No acute territorial infarction, hemorrhage or intracranial mass is seen. Old encephalomalacia involving the right parietal and temporal lobes consistent with old infarct. Moderate periventricular and subcortical white matter small vessel ischemic changes. Moderate atrophy. Prominent ventricles  are felt secondary to atrophy. No midline shift. Vascular: No hyperdense vessels.  Carotid artery calcifications. Skull: No fracture or suspicious bone lesion. Prominent arachnoid granulation in the sub occipital bone on the right. Other: Partially visualize mass surrounding the lateral aspect of the right maxillary sinus and the anterior aspect of the zygoma. CT MAXILLOFACIAL FINDINGS Osseous: Mandibular heads are normally position. No mandibular fracture. Zygomatic arches and pterygoid plates are intact. No acute nasal bone fracture. Mild periosteal change involving the anterior zygoma and lateral wall of the right maxillary sinus. Orbits: No orbital wall fracture. No intra or extraconal soft tissue abnormality Sinuses: No acute fluid levels. Mild mucosal thickening in the maxillary and  ethmoid sinuses. No sinus wall fracture. Soft tissues: Small metallic densities in the right and left nasal superficial soft tissues. Soft tissue mass centered at the junction of the right maxillary sinus and anterior zygoma, this measures 3.2 by 1.8 cm. IMPRESSION: 1. No CT evidence for acute intracranial abnormality. 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 Moderate white matter small vessel ischemic changes 2. No acute facial bone fracture. 3. 3.2 cm mass surrounding the junction of the right anterolateral maxillary sinus and the anterior aspect of the zygomatic arch with underlying mild periostitis. Findings could relate to metastatic lesion or primary bone tumor. Further evaluation with MRI is recommended. 4. Superficial metallic densities in the right and left nasal soft tissues for which clinical correlation is recommended Electronically Signed   By: Donavan Foil M.D.   On: 05/04/2017 21:29    EKG: Independently reviewed. Sinus rhythm, PAC, incomplete RBBB and LAFB.  Assessment/Plan  1. Weakness  - Pt presents with generalized weakness, progressing in recent weeks, most notable in bilateral legs and  leading to falls  - Head CT is negative for acute intracranial abnormality - Neurology has evaluated the pt in ED and their consultation much appreciated; MRI lumbar and thoracic spine advised for suspected metastatic disease  - Follow-up MRI T- and L-spine  - PT eval and tx requested    2. Acute on chronic CHF  - Pt presents with progressive BLE edema and DOE, found to have crackles, elevated BNP, interstitial edema on CXR  - No echo report on file  - He is managed at home with Lasix 40 mg BID and metoprolol  - Plan to SLIV, follow daily wts and strict I/O's, continue metoprolol as tolerated, and diurese with Lasix 40 mg IV q12h  - Echocardiogram ordered   3. Facial mass  - Pt presents with firm nodule inferior to right eye, growing over the past 2 wks  - CT maxillofacial suggests a primary bone tumor vs metastatic disease  - MRI for further characterization    4. Hypertension  - BP is at goal  - Continue Lopressor as tolerated   5. Urothelial carcinoma  - Status-post right nephroureterectomy in March '18, underwent TURBT in early May '18  - Follows with Dr. Tresa Moore of urology  - Concern for possible metastatic disease is being evaluated as above    6. Iron-deficiency anemia  - Hgb is stable at 9.1 on admission and no bleeding is evident  - Continue iron-supplementation     DVT prophylaxis: sq Lovenox Code Status: Full  Family Communication: Wife and daughter updated at bedside Disposition Plan: Admit to telemetry Consults called: Neurology Admission status: Inpatient    Vianne Bulls, MD [facility] Pager [phone]  If 7PM-7AM, please contact night-coverage [email or web address] Password [email or web address]  05/04/2017, 11:14 PM

## 2017-05-04 NOTE — ED Notes (Signed)
Bed: WA08 Expected date:  Expected time:  Means of arrival:  Comments: ems 

## 2017-05-05 ENCOUNTER — Inpatient Hospital Stay (HOSPITAL_COMMUNITY): Payer: Medicare Other

## 2017-05-05 DIAGNOSIS — E785 Hyperlipidemia, unspecified: Secondary | ICD-10-CM

## 2017-05-05 DIAGNOSIS — C679 Malignant neoplasm of bladder, unspecified: Secondary | ICD-10-CM

## 2017-05-05 DIAGNOSIS — L89159 Pressure ulcer of sacral region, unspecified stage: Secondary | ICD-10-CM

## 2017-05-05 DIAGNOSIS — L8990 Pressure ulcer of unspecified site, unspecified stage: Secondary | ICD-10-CM | POA: Insufficient documentation

## 2017-05-05 DIAGNOSIS — R29898 Other symptoms and signs involving the musculoskeletal system: Secondary | ICD-10-CM

## 2017-05-05 DIAGNOSIS — D509 Iron deficiency anemia, unspecified: Secondary | ICD-10-CM

## 2017-05-05 DIAGNOSIS — T1590XA Foreign body on external eye, part unspecified, unspecified eye, initial encounter: Secondary | ICD-10-CM

## 2017-05-05 DIAGNOSIS — K219 Gastro-esophageal reflux disease without esophagitis: Secondary | ICD-10-CM

## 2017-05-05 DIAGNOSIS — I371 Nonrheumatic pulmonary valve insufficiency: Secondary | ICD-10-CM

## 2017-05-05 LAB — ECHOCARDIOGRAM COMPLETE
HEIGHTINCHES: 72 in
Weight: 4582.04 oz

## 2017-05-05 LAB — BASIC METABOLIC PANEL
Anion gap: 9 (ref 5–15)
BUN: 23 mg/dL — AB (ref 6–20)
CHLORIDE: 99 mmol/L — AB (ref 101–111)
CO2: 29 mmol/L (ref 22–32)
CREATININE: 0.98 mg/dL (ref 0.61–1.24)
Calcium: 10.4 mg/dL — ABNORMAL HIGH (ref 8.9–10.3)
GFR calc Af Amer: >60 mL/min (ref >60)
GFR calc non Af Amer: >60 mL/min (ref >60)
Glucose, Bld: 114 mg/dL — ABNORMAL HIGH (ref 65–99)
Potassium: 4 mmol/L (ref 3.5–5.1)
Sodium: 137 mmol/L (ref 135–145)

## 2017-05-05 MED ORDER — PERFLUTREN LIPID MICROSPHERE
INTRAVENOUS | Status: AC
Start: 2017-05-05 — End: ?
  Filled 2017-05-05: qty 10

## 2017-05-05 MED ORDER — CHLORHEXIDINE GLUCONATE 0.12 % MT SOLN
15.0000 mL | Freq: Two times a day (BID) | OROMUCOSAL | Status: DC
  Administered 2017-05-05 – 2017-05-09 (×10): 15 mL via OROMUCOSAL
  Filled 2017-05-05 (×11): qty 15

## 2017-05-05 MED ORDER — ORAL CARE MOUTH RINSE
15.0000 mL | Freq: Two times a day (BID) | OROMUCOSAL | Status: DC
  Administered 2017-05-05 – 2017-05-09 (×6): 15 mL via OROMUCOSAL

## 2017-05-05 MED ORDER — PERFLUTREN LIPID MICROSPHERE
1.0000 mL | INTRAVENOUS | Status: AC | PRN
  Administered 2017-05-05: 2 mL via INTRAVENOUS
  Filled 2017-05-05: qty 10

## 2017-05-05 NOTE — Progress Notes (Signed)
Spoke with pt's daughter at bedside concerning discharge plans. SNF/[facility] [place] was requested. Referral given to CSW.

## 2017-05-05 NOTE — Progress Notes (Signed)
CSW following for discharge needs for placement. Physical Therapy attempted to see patient today. RNCM reports patient is interested in [facility]. CSW will follow up.   Kathrin Greathouse, Latanya Presser, MSW Clinical Social Worker 5E and Psychiatric Service Line [phone] 05/05/2017  4:05 PM

## 2017-05-05 NOTE — Progress Notes (Signed)
PT Cancellation Note  Patient Details Name: JOJO PEHL MRN: [id number] DOB: May 21, 1935   Cancelled Treatment:    Reason Eval/Treat Not Completed: Fatigue/lethargy limiting ability to participate . States that he has been busy, wants to wait until tomorrow. Family at [facility] encouraged patient, still declined. Will check back tomorrow.  [name] 05/05/2017, 2:35 PM Tresa Endo PT [phone]

## 2017-05-05 NOTE — Consult Note (Signed)
   [facility] CM Inpatient Consult   05/05/2017  ILLIAS PANTANO 1935-10-05 [id number]    [facility] referral received. Chart reviewed. Spoke with inpatient RNCM. Will await for therapy recommendations before engaging for [place] Management services. Will continue to follow.   Marthenia Rolling, MSN-Ed, RN,BSN [facility] Liaison [phone]

## 2017-05-05 NOTE — Progress Notes (Signed)
PROGRESS NOTE    Victor Wang  QMG:[id number] DOB: Sep 21, 1935 DOA: 05/04/2017 PCP: Townsend Roger, MD   Brief Narrative:  Victor Wang is a 82 y.o. male with medical history significant for chronic CHF and recurrent urothelial carcinoma status post right nephroureterectomy in March 2018, now presenting to the emergency department with increasing weakness and falls, progressive lower extremity edema, and shortness of breath. Patient has been noted to grow increasingly weak, generally, over the past couple weeks, but much more acutely since 04/29/2017. He has had multiple falls since that time, but none with head injury or loss of consciousness. He reports weakness involving all of his extremities, but with legs more so than arms, and perhaps the right leg more so than the left. He denies any numbness, denies headache, and denies change in vision or hearing. His Lasix had been held for the last several days due to increasing weakness, but he has since developed progressive lower extremity edema and shortness of breath. Also concerning is an enlarging nodule under the right eye, first noted a couple weeks ago and growing. Patient reports that it is tender to palpation, but otherwise does not bother him.     Noncontrast head CT was negative for acute intracranial abnormality, but maxillofacial CT demonstrates a 3.2 cm mass at the junction of the right anterolateral maxillary sinus and anterior aspect of the zygomatic arch with mild periostitis and concerning for possible primary bone tumor or a metastasis. Neurology was consulted by the ED physician and has evaluated the patient in the emergency department. Patient remained hemodynamically stable and has not been in acute respiratory distress. He was  admitted to the telemetry unit for ongoing evaluation and management of acute on chronic CHF and generalized weakness, with increased weakness involving the right leg in the presence of new worsening  back pain concerning for metastatic disease. CT Maxillofacial also showed small metallic densities in the Right and left Superficial Soft tissues. He underwent Eye X-Ray which revealed that he had Metallic foci imbedded in the skin of the nose, medial right orbit, and central left orbit. Patient states that they have been there for a while now. MRI of the Thoracic and Lumbar Spine was ordered and could not be done at [facility] as patient was too big to fit in MRI machine so will be transferred to [facility] to have it done and brought back. Patient also underwent and ECHOCardiogram today.   Assessment & Plan:   Principal Problem:   Right leg weakness Active Problems:   Bladder cancer (HCC)   Acute CHF (HCC)   Essential hypertension   Cancer of renal pelvis, right (HCC)   Facial mass   Leg weakness   Pressure injury of skin   Iron deficiency anemia   GERD (gastroesophageal reflux disease)   Hyperlipidemia   Hypercalcemia  Generalized Weakness and Deconditioning - Pt presents with generalized weakness, progressing in recent weeks, most notable in bilateral legs and leading to falls  - Head CT is negative for acute intracranial abnormality - Neurology has evaluated the pt in ED and their consultation much appreciated;  - MRI lumbar and thoracic spine advised for suspected metastatic disease; Concern as Alk Phos is also elevated - Follow-up MRI T- and L-spine once it is done now that we know that the metal foci in patient's face aren't an issue  - PT to Evaluate however fatigue caused patient to limit his ablilty to participate   Acute on Chronic Diastolic  CHF  - Pt presents with progressive BLE edema and DOE, found to have crackles, elevated BNP, interstitial edema on CXR  - No ECHO was on file  - BNP was 113.6 - He is managed at home with Lasix 40 mg BID and metoprolol  - Plan to SLIV, follow daily wts and strict I/O's,  -Continue metoprolol as tolerated, and diurese with Lasix 40  mg IV q12h  - Echocardiogram ordered and showed that it was difficult to seen endocardium but wall thickness was increased in a pattern of severe LVH but normal Systolic Fxc and an estimated EF of 50-55% with G1DD.  -Patient is - 790 mL   Facial mass  - Pt presents with firm nodule inferior to right eye, growing over the past 2 wks  - CT maxillofacial suggests a primary bone tumor vs metastatic disease  - MRI for further characterization to see if patient has metastatic disease  Hypertension  - BP is at goal  - Continue Metoprolol 50 mg po Daily as tolerated   Urothelial carcinoma which was recurrent and Renal Cancer - Status-post right nephroureterectomy in March '18, underwent TURBT in early May '18  - Follows with Dr. Berneice Heinrich of Urology  - Concern for possible metastatic disease is being evaluated as above    Iron-deficiency Anemia  - Hgb is stable at 9.1 on admission and no bleeding is evident  - Continue iron-supplementation with Ferrous Sulfate 325 mg po BID -Repeat CBC is Pending   GERD -C/w Pantoprazole 40 mg po Daily  Hyperlipidemia -C/w Pravastatin 80 mg po Daily  Mild Hypercalcemia -Was mildly elevated at 10.4 -? Related to Malignancy  -Repeat CMP in AM   Pressure Ulcer on Sacrum  -Mild and poA -WOC Nurse evaluated and left Recc's and appreciate them -Per WOC nurse turn and reposition patient from side to side-avoiding the supine position.  Additionally, we will provide Pressure Redistribution heel boots to prevent pressure injury while in bed.  A pressure redistribution chair cushion is ordered for use while OOB in chair and post discharge.  DVT prophylaxis: Enoxaparin 40 mg sq q24h Code Status: FULL CODE Family Communication: Discussed with Son at Bedside Disposition Plan: Remain Inpatient as Work Up is in Progress; PT to Evaluate   Consultants:   Neurology   Procedures:  ECHOCARDIOGRAM Study Conclusions  - Procedure narrative: Transthoracic  echocardiography. Image   quality was suboptimal. The study was technically difficult, as a   result of poor acoustic windows, poor sound wave transmission,   restricted patient mobility, and body habitus. Intravenous   contrast (Definity) was administered. - Left ventricle: Ver poor image quality even with definity   Difficult to see endocardium Consider cardiac MRI if clinically   indicated to more acurately asses EF. Wall thickness was   increased in a pattern of severe LVH. Systolic function was   normal. The estimated ejection fraction was in the range of 50%   to 55%. Doppler parameters are consistent with abnormal left   ventricular relaxation (grade 1 diastolic dysfunction). - Atrial septum: No defect or patent foramen ovale was identified.  Antimicrobials:  Anti-infectives    None     Subjective: Seen and examined and was feeling weak. No nausea or vomiting. States legs were significantly swelling. Denied and CP or SOB. No other complaints.   Objective: Vitals:   05/05/17 0011 05/05/17 0050 05/05/17 0500 05/05/17 1356  BP: 130/69  (!) 153/65 (!) 103/42  Pulse: (!) 103 80 97 92  Resp:  _0 Temp: 98.7 F (37.1 C)  98.2 F (36.8 C) 97.8 F (36.6 C)  TempSrc: Oral  Oral Oral  SpO2: 92% 97% 100% 97%  Weight:   129.9 kg (286 lb 6 oz)   Height:        Intake/Output Summary (Last 24 hours) at 05/05/17 1749 Last data filed at 05/05/17 1735  Gross per 24 hour  Intake              960 ml  Output             1750 ml  Net             -790 ml   Filed Weights   05/04/17 1839 05/04/17 2354 05/05/17 0500  Weight: 130 kg (286 lb 11.2 oz) 130 kg (286 lb 9.6 oz) 129.9 kg (286 lb 6 oz)   Examination: Physical Exam:  Constitutional: WN/WD obese Caucasian male, NAD and appears calm  Eyes: Mass right under right eye. Sclerae appear anicteric  ENMT: External Ears, Nose appear normal. Grossly normal hearing.  Neck: Appears normal, supple, no cervical masses, normal ROM,  no appreciable thyromegaly, no visible JVD Respiratory: Diminished to auscultation bilaterally, no wheezing, rales, rhonchi or crackles. Normal respiratory effort and patient is not tachypenic. No accessory muscle use.  Cardiovascular: RRR, no murmurs / rubs / gallops. S1 and S2 auscultated. 1+ Lower extremity edema.  Abdomen: Soft, non-tender, non-distended. No masses palpated. No appreciable hepatosplenomegaly. Bowel sounds positive x4.  GU: Deferred. Musculoskeletal: No clubbing / cyanosis of digits/nails. No joint deformity upper and lower extremities.  Skin: No rashes, lesions, ulcers on limited skin evaluation. No induration; Warm and dry.  Neurologic: CN 2-12 grossly intact. Romberg sign cerebellar reflexes not assessed.  Psychiatric: Normal judgment and insight. Alert and oriented x 3. Normal mood and appropriate affect.   Data Reviewed: I have personally reviewed following labs and imaging studies  CBC:  Recent Labs Lab 05/04/17 1859  WBC 7.2  NEUTROABS 6.0  HGB 9.1*  HCT 28.8*  MCV 93.2  PLT 893   Basic Metabolic Panel:  Recent Labs Lab 05/04/17 1859 05/05/17 0530  NA 135 137  K 4.2 4.0  CL 98* 99*  CO2 29 29  GLUCOSE 124* 114*  BUN 23* 23*  CREATININE 0.98 0.98  CALCIUM 10.3 10.4*   GFR: Estimated Creatinine Clearance: 85.2 mL/min (by C-G formula based on SCr of 0.98 mg/dL). Liver Function Tests:  Recent Labs Lab 05/04/17 1859  AST 39  ALT 36  ALKPHOS 359*  BILITOT 1.1  PROT 6.4*  ALBUMIN 2.8*   No results for input(s): LIPASE, AMYLASE in the last 168 hours. No results for input(s): AMMONIA in the last 168 hours. Coagulation Profile:  Recent Labs Lab 05/04/17 1859  INR 1.29   Cardiac Enzymes:  Recent Labs Lab 05/04/17 1859  TROPONINI <0.03   BNP (last 3 results) No results for input(s): PROBNP in the last 8760 hours. HbA1C: No results for input(s): HGBA1C in the last 72 hours. CBG: No results for input(s): GLUCAP in the last 168  hours. Lipid Profile: No results for input(s): CHOL, HDL, LDLCALC, TRIG, CHOLHDL, LDLDIRECT in the last 72 hours. Thyroid Function Tests: No results for input(s): TSH, T4TOTAL, FREET4, T3FREE, THYROIDAB in the last 72 hours. Anemia Panel: No results for input(s): VITAMINB12, FOLATE, FERRITIN, TIBC, IRON, RETICCTPCT in the last 72 hours. Sepsis Labs: No results for input(s): PROCALCITON, LATICACIDVEN in the last 168 hours.  No results found  for this or any previous visit (from the past 240 hour(s)).   Radiology Studies: Dg Eye Foreign Body  Result Date: 05/05/2017 CLINICAL DATA:  Three metallic foci are identified. Correlation with a CT scan from yesterday demonstrates a metallic focus in the skin of the medial right orbit and another in the skin over the left orbit. Again, both appear to be imbedded in the skin. Metallic foci are seen in the nose as well, imbedded in the soft tissues. No other abnormalities. EXAM: ORBITS FOR FOREIGN BODY - 2 VIEW COMPARISON:  None. FINDINGS: Metallic foci imbedded in the skin of the nose, medial right orbit, and central left orbit. I reviewed the maxillofacial CT with a neuroradiologist. It was decided an MRI of the face would not provide enough additional information to justify the MRI of the face. The MRI of the thoracic and lumbar spine can be performed as long as the patient could communicate any pain or discomfort to the MRI technologist. IMPRESSION: Metallic foci imbedded in the skin of the nose, medial right orbit, and central left orbit should be stable. I reviewed the maxillofacial CT with a neuroradiologist. It was decided an MRI of the face would not provide enough additional information to justify the MRI of the face. The MRI of the thoracic and lumbar spine can be performed in this patient with extremity weakness as long as the patient can communicate any pain or discomfort to the MRI technologist. These findings and recommendations were discussed with the  MRI technologist. Electronically Signed   By: Dorise Bullion III M.D   On: 05/05/2017 08:58   Dg Nasal Bones  Result Date: 05/04/2017 CLINICAL DATA:  Question nasal metallic foreign body. EXAM: NASAL BONES - 3+ VIEW COMPARISON:  Face CT earlier this day. FINDINGS: There are 2 metallic foreign bodies projecting over the right and left nasal region, which are in the soft tissues on CT. No nasal bone fracture. IMPRESSION: Tiny metallic densities project over the right and left nasal region, which corresponds soft tissue foreign bodies on CT. Electronically Signed   By: Jeb Levering M.D.   On: 05/04/2017 22:40   Dg Chest 2 View  Result Date: 05/04/2017 CLINICAL DATA:  Dyspnea cough. EXAM: CHEST  2 VIEW COMPARISON:  01/01/2017 FINDINGS: AP and lateral views of the chest show low volumes with cardiomegaly. There is vascular congestion with diffuse interstitial opacity compatible with edema. Right base atelectasis or infiltrate noted. Small right pleural effusion. The visualized bony structures of the thorax are intact. Telemetry leads overlie the chest. IMPRESSION: Cardiomegaly with interstitial pulmonary edema. Right base atelectasis with small right pleural effusion. Electronically Signed   By: Misty Stanley M.D.   On: 05/04/2017 20:41   Ct Head Wo Contrast  Result Date: 05/04/2017 CLINICAL DATA:  Increased weakness with multiple falls, swelling and bruising to the occipital and right zygomatic arch region EXAM: CT HEAD WITHOUT CONTRAST CT MAXILLOFACIAL WITHOUT CONTRAST TECHNIQUE: Multidetector CT imaging of the head and maxillofacial structures were performed using the standard protocol without intravenous contrast. Multiplanar CT image reconstructions of the maxillofacial structures were also generated. COMPARISON:  None. FINDINGS: CT HEAD FINDINGS Brain: No acute territorial infarction, hemorrhage or intracranial mass is seen. Old encephalomalacia involving the right parietal and temporal lobes  consistent with old infarct. Moderate periventricular and subcortical white matter small vessel ischemic changes. Moderate atrophy. Prominent ventricles are felt secondary to atrophy. No midline shift. Vascular: No hyperdense vessels.  Carotid artery calcifications. Skull: No fracture or suspicious  bone lesion. Prominent arachnoid granulation in the sub occipital bone on the right. Other: Partially visualize mass surrounding the lateral aspect of the right maxillary sinus and the anterior aspect of the zygoma. CT MAXILLOFACIAL FINDINGS Osseous: Mandibular heads are normally position. No mandibular fracture. Zygomatic arches and pterygoid plates are intact. No acute nasal bone fracture. Mild periosteal change involving the anterior zygoma and lateral wall of the right maxillary sinus. Orbits: No orbital wall fracture. No intra or extraconal soft tissue abnormality Sinuses: No acute fluid levels. Mild mucosal thickening in the maxillary and ethmoid sinuses. No sinus wall fracture. Soft tissues: Small metallic densities in the right and left nasal superficial soft tissues. Soft tissue mass centered at the junction of the right maxillary sinus and anterior zygoma, this measures 3.2 by 1.8 cm. IMPRESSION: 1. No CT evidence for acute intracranial abnormality. 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 Moderate white matter small vessel ischemic changes 2. No acute facial bone fracture. 3. 3.2 cm mass surrounding the junction of the right anterolateral maxillary sinus and the anterior aspect of the zygomatic arch with underlying mild periostitis. Findings could relate to metastatic lesion or primary bone tumor. Further evaluation with MRI is recommended. 4. Superficial metallic densities in the right and left nasal soft tissues for which clinical correlation is recommended Electronically Signed   By: Donavan Foil M.D.   On: 05/04/2017 21:29   Ct Maxillofacial Wo Contrast  Result Date: 05/04/2017 CLINICAL  DATA:  Increased weakness with multiple falls, swelling and bruising to the occipital and right zygomatic arch region EXAM: CT HEAD WITHOUT CONTRAST CT MAXILLOFACIAL WITHOUT CONTRAST TECHNIQUE: Multidetector CT imaging of the head and maxillofacial structures were performed using the standard protocol without intravenous contrast. Multiplanar CT image reconstructions of the maxillofacial structures were also generated. COMPARISON:  None. FINDINGS: CT HEAD FINDINGS Brain: No acute territorial infarction, hemorrhage or intracranial mass is seen. Old encephalomalacia involving the right parietal and temporal lobes consistent with old infarct. Moderate periventricular and subcortical white matter small vessel ischemic changes. Moderate atrophy. Prominent ventricles are felt secondary to atrophy. No midline shift. Vascular: No hyperdense vessels.  Carotid artery calcifications. Skull: No fracture or suspicious bone lesion. Prominent arachnoid granulation in the sub occipital bone on the right. Other: Partially visualize mass surrounding the lateral aspect of the right maxillary sinus and the anterior aspect of the zygoma. CT MAXILLOFACIAL FINDINGS Osseous: Mandibular heads are normally position. No mandibular fracture. Zygomatic arches and pterygoid plates are intact. No acute nasal bone fracture. Mild periosteal change involving the anterior zygoma and lateral wall of the right maxillary sinus. Orbits: No orbital wall fracture. No intra or extraconal soft tissue abnormality Sinuses: No acute fluid levels. Mild mucosal thickening in the maxillary and ethmoid sinuses. No sinus wall fracture. Soft tissues: Small metallic densities in the right and left nasal superficial soft tissues. Soft tissue mass centered at the junction of the right maxillary sinus and anterior zygoma, this measures 3.2 by 1.8 cm. IMPRESSION: 1. No CT evidence for acute intracranial abnormality. 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  Moderate white matter small vessel ischemic changes 2. No acute facial bone fracture. 3. 3.2 cm mass surrounding the junction of the right anterolateral maxillary sinus and the anterior aspect of the zygomatic arch with underlying mild periostitis. Findings could relate to metastatic lesion or primary bone tumor. Further evaluation with MRI is recommended. 4. Superficial metallic densities in the right and left nasal soft tissues for which  clinical correlation is recommended Electronically Signed   By: Donavan Foil M.D.   On: 05/04/2017 21:29   Scheduled Meds: . allopurinol  100 mg Oral Daily  . calcium-vitamin D  1 tablet Oral Q breakfast  . chlorhexidine  15 mL Mouth Rinse BID  . enoxaparin (LOVENOX) injection  40 mg Subcutaneous Q24H  . ferrous sulfate  325 mg Oral BID WC  . furosemide  40 mg Intravenous Q12H  . mouth rinse  15 mL Mouth Rinse q12n4p  . metoprolol tartrate  50 mg Oral Daily  . pantoprazole  40 mg Oral Daily  . polyethylene glycol  17 g Oral Daily  . pravastatin  80 mg Oral q1800  . sodium chloride flush  3 mL Intravenous Q12H   Continuous Infusions: . sodium chloride      LOS: 1 day   Kerney Elbe, DO [facility] Pager [phone]  If 7PM-7AM, please contact night-coverage [email or web address] Password [facility] 05/05/2017, 5:49 PM

## 2017-05-05 NOTE — Consult Note (Signed)
[place] Nurse wound consult note Reason for Consult:Sacral ulcer.  Sacrum noted to be intact.  Small area of denuded tissue at right buttock. Wound type: Friction with moisture (perspiration) Pressure Injury POA: No Measurement:0.4cm round x 0.1cm Wound EKB:TCYE, dry Drainage (amount, consistency, odor) None Periwound:Intact, dry Dressing procedure/placement/frequency: I will provide guidance for Nursing to turn and reposition patient from side to side-avoiding the supine position.  Additionally, we will provide Pressure Redistribution heel boots to prevent pressure injury while in bed.  A pressure redistribution chair cushion is ordered for use while OOB in chair and post discharge. [place] nursing team will not follow, but will remain available to this patient, the nursing and medical teams.  Please re-consult if needed. Thanks, Maudie Flakes, MSN, RN, [place], Arther Abbott  Pager# [phone]

## 2017-05-05 NOTE — Progress Notes (Signed)
  Echocardiogram 2D Echocardiogram has been performed.  Bobbye Charleston 05/05/2017, 4:26 PM

## 2017-05-06 ENCOUNTER — Other Ambulatory Visit (HOSPITAL_COMMUNITY): Payer: Self-pay | Admitting: Radiology

## 2017-05-06 ENCOUNTER — Inpatient Hospital Stay (HOSPITAL_COMMUNITY): Payer: Medicare Other

## 2017-05-06 LAB — PHOSPHORUS: Phosphorus: 3.8 mg/dL (ref 2.5–4.6)

## 2017-05-06 LAB — CBC WITH DIFFERENTIAL/PLATELET
BASOS PCT: 0 %
Basophils Absolute: 0 10*3/uL (ref 0.0–0.1)
EOS ABS: 0.1 10*3/uL (ref 0.0–0.7)
Eosinophils Relative: 1 %
HCT: 28.4 % — ABNORMAL LOW (ref 39.0–52.0)
HEMOGLOBIN: 9 g/dL — AB (ref 13.0–17.0)
Lymphocytes Relative: 8 %
Lymphs Abs: 0.7 10*3/uL (ref 0.7–4.0)
MCH: 29.4 pg (ref 26.0–34.0)
MCHC: 31.7 g/dL (ref 30.0–36.0)
MCV: 92.8 fL (ref 78.0–100.0)
Monocytes Absolute: 1 10*3/uL (ref 0.1–1.0)
Monocytes Relative: 12 %
Neutro Abs: 6.6 10*3/uL (ref 1.7–7.7)
Neutrophils Relative %: 79 %
PLATELETS: 196 10*3/uL (ref 150–400)
RBC: 3.06 MIL/uL — ABNORMAL LOW (ref 4.22–5.81)
RDW: 15 % (ref 11.5–15.5)
WBC: 8.4 10*3/uL (ref 4.0–10.5)

## 2017-05-06 LAB — COMPREHENSIVE METABOLIC PANEL
ALBUMIN: 2.6 g/dL — AB (ref 3.5–5.0)
ALT: 32 U/L (ref 17–63)
ANION GAP: 9 (ref 5–15)
AST: 38 U/L (ref 15–41)
Alkaline Phosphatase: 366 U/L — ABNORMAL HIGH (ref 38–126)
BUN: 27 mg/dL — ABNORMAL HIGH (ref 6–20)
CHLORIDE: 95 mmol/L — AB (ref 101–111)
CO2: 31 mmol/L (ref 22–32)
Calcium: 10.2 mg/dL (ref 8.9–10.3)
Creatinine, Ser: 1.07 mg/dL (ref 0.61–1.24)
GFR calc Af Amer: >60 mL/min (ref >60)
GFR calc non Af Amer: >60 mL/min (ref >60)
GLUCOSE: 112 mg/dL — AB (ref 65–99)
POTASSIUM: 3.8 mmol/L (ref 3.5–5.1)
SODIUM: 135 mmol/L (ref 135–145)
Total Bilirubin: 1 mg/dL (ref 0.3–1.2)
Total Protein: 6.1 g/dL — ABNORMAL LOW (ref 6.5–8.1)

## 2017-05-06 LAB — MAGNESIUM: Magnesium: 1.7 mg/dL (ref 1.7–2.4)

## 2017-05-06 MED ORDER — LEVALBUTEROL HCL 0.63 MG/3ML IN NEBU
0.6300 mg | INHALATION_SOLUTION | Freq: Four times a day (QID) | RESPIRATORY_TRACT | Status: DC
  Administered 2017-05-06 (×2): 0.63 mg via RESPIRATORY_TRACT
  Filled 2017-05-06 (×2): qty 3

## 2017-05-06 MED ORDER — LEVALBUTEROL HCL 0.63 MG/3ML IN NEBU
0.6300 mg | INHALATION_SOLUTION | Freq: Three times a day (TID) | RESPIRATORY_TRACT | Status: DC
  Administered 2017-05-07 – 2017-05-08 (×5): 0.63 mg via RESPIRATORY_TRACT
  Filled 2017-05-06 (×6): qty 3

## 2017-05-06 MED ORDER — BISACODYL 10 MG RE SUPP
10.0000 mg | Freq: Once | RECTAL | Status: AC
  Administered 2017-05-06: 10 mg via RECTAL
  Filled 2017-05-06: qty 1

## 2017-05-06 MED ORDER — SENNOSIDES-DOCUSATE SODIUM 8.6-50 MG PO TABS
1.0000 | ORAL_TABLET | Freq: Two times a day (BID) | ORAL | Status: DC
  Administered 2017-05-06 – 2017-05-10 (×7): 1 via ORAL
  Filled 2017-05-06 (×9): qty 1

## 2017-05-06 MED ORDER — GADOBENATE DIMEGLUMINE 529 MG/ML IV SOLN
20.0000 mL | Freq: Once | INTRAVENOUS | Status: AC
  Administered 2017-05-06: 20 mL via INTRAVENOUS

## 2017-05-06 MED ORDER — IPRATROPIUM BROMIDE 0.02 % IN SOLN
0.5000 mg | Freq: Four times a day (QID) | RESPIRATORY_TRACT | Status: DC
  Administered 2017-05-06 (×2): 0.5 mg via RESPIRATORY_TRACT
  Filled 2017-05-06 (×2): qty 2.5

## 2017-05-06 MED ORDER — POLYETHYLENE GLYCOL 3350 17 G PO PACK
17.0000 g | PACK | Freq: Two times a day (BID) | ORAL | Status: DC
  Administered 2017-05-06 – 2017-05-10 (×5): 17 g via ORAL
  Filled 2017-05-06 (×7): qty 1

## 2017-05-06 MED ORDER — IPRATROPIUM BROMIDE 0.02 % IN SOLN
0.5000 mg | Freq: Three times a day (TID) | RESPIRATORY_TRACT | Status: DC
  Administered 2017-05-07 – 2017-05-08 (×5): 0.5 mg via RESPIRATORY_TRACT
  Filled 2017-05-06 (×6): qty 2.5

## 2017-05-06 MED ORDER — DM-GUAIFENESIN ER 30-600 MG PO TB12
1.0000 | ORAL_TABLET | Freq: Two times a day (BID) | ORAL | Status: DC
Start: 2017-05-06 — End: 2017-05-10
  Administered 2017-05-06 – 2017-05-10 (×9): 1 via ORAL
  Filled 2017-05-06 (×9): qty 1

## 2017-05-06 NOTE — Progress Notes (Signed)
Pt utilizing home CPAP machine.

## 2017-05-06 NOTE — Progress Notes (Signed)
PT Cancellation Note  Patient Details Name: BENOIT MEECH MRN: [id number] DOB: July 09, 1935   Cancelled Treatment:    Reason Eval/Treat Not Completed: Fatigue/lethargy limiting ability to participate (pt reports he was up much of the night and is too tired to attempt any movement right now. Despite encouragement and explaination of benefits of mobility, he continued to refuse. Will follow. )   Philomena Doheny 05/06/2017, 9:48 AM [phone]

## 2017-05-06 NOTE — Progress Notes (Signed)
PROGRESS NOTE    Victor Wang  OVZ:[id number] DOB: [birth year] DOA: 05/04/2017 PCP: Townsend Roger, MD   Brief Narrative:  Victor Wang is a 82 y.o. male with medical history significant for chronic CHF and recurrent urothelial carcinoma status post right nephroureterectomy in March 2018, now presenting to the emergency department with increasing weakness and falls, progressive lower extremity edema, and shortness of breath. Patient has been noted to grow increasingly weak, generally, over the past couple weeks, but much more acutely since 04/29/2017. He has had multiple falls since that time, but none with head injury or loss of consciousness. He reports weakness involving all of his extremities, but with legs more so than arms, and perhaps the right leg more so than the left. He denies any numbness, denies headache, and denies change in vision or hearing. His Lasix had been held for the last several days due to increasing weakness, but he has since developed progressive lower extremity edema and shortness of breath. Also concerning is an enlarging nodule under the right eye, first noted a couple weeks ago and growing. Patient reports that it is tender to palpation, but otherwise does not bother him.     Noncontrast head CT was negative for acute intracranial abnormality, but maxillofacial CT demonstrates a 3.2 cm mass at the junction of the right anterolateral maxillary sinus and anterior aspect of the zygomatic arch with mild periostitis and concerning for possible primary bone tumor or a metastasis. Neurology was consulted by the ED physician and has evaluated the patient in the emergency department. Patient remained hemodynamically stable and has not been in acute respiratory distress. He was  admitted to the telemetry unit for ongoing evaluation and management of acute on chronic CHF and generalized weakness, with increased weakness involving the right leg in the presence of new worsening  back pain concerning for metastatic disease. CT Maxillofacial also showed small metallic densities in the Right and left Superficial Soft tissues. He underwent Eye X-Ray which revealed that he had Metallic foci imbedded in the skin of the nose, medial right orbit, and central left orbit. Patient states that they have been there for a while now. MRI of the Thoracic and Lumbar Spine was ordered and could not be done at [facility] as patient was too big to fit in MRI machine so will be transferred to [facility] to have it done and brought back. Patient also underwent and ECHOCardiogram yesterday. Patient complained of Constipation today so was given a bisacodyl suppository and Increased Bowel Regimen. MRI was done today at [facility] and shows metastatic lesions to the thoracic and lumbar spine. Will consult Oncology and Radiation Oncology in AM as well as possible Palliative Care.   Assessment & Plan:   Principal Problem:   Right leg weakness Active Problems:   Bladder cancer (HCC)   Acute CHF (HCC)   Essential hypertension   Cancer of renal pelvis, right (HCC)   Facial mass   Leg weakness   Pressure injury of skin   Iron deficiency anemia   GERD (gastroesophageal reflux disease)   Hyperlipidemia   Hypercalcemia  Generalized Weakness and Deconditioning 2/2 to Metastaic Disease - Pt presents with generalized weakness, progressing in recent weeks, most notable in bilateral legs and leading to falls  - Head CT is negative for acute intracranial abnormality - Neurology has evaluated the pt in ED and their consultation much appreciated;  - MRI lumbar and thoracic spine advised for suspected metastatic disease; Concern as  Alk Phos is also elevated - MRI T- and L-spine showed multiple metastatic lesions to the thoracic and lumbar spine as well as a right Paraspinous soft tissue mass at the T6-7 Level with extension into the T6 and T7 Pedicles and T6-7 Foramen. There were also retrocrural and  paraaortic adenopathy compatible with metastatic disease as well as a a 10 cm aortocaval soft tissue mass compatible with metastatic disease. MRI also noted remote fractures at T12, L3, and L4 and the largest Bone metastases are at L5, T10, and the Right Greater than Left sacroiliac joints.  - PT to Evaluate however fatigue caused patient to limit his ablilty to participate  -Will need to Discuss with Oncology and Radiation Oncology in AM  Acute on Chronic Diastolic CHF  - Pt presents with progressive BLE edema and DOE, found to have crackles, elevated BNP, interstitial edema on CXR  - No ECHO was on file  - BNP was 113.6 - He is managed at home with Lasix 40 mg BID and metoprolol  - Plan to SLIV, follow daily wts and strict I/O's,  -Continue metoprolol as tolerated, and diurese with Lasix 40 mg IV q12h  - Echocardiogram ordered and showed that it was difficult to seen endocardium but wall thickness was increased in a pattern of severe LVH but normal Systolic Fxc and an estimated EF of 50-55% with G1DD.  -Patient is - 2.570 mL   Facial mass  - Pt presents with firm nodule inferior to right eye, growing over the past 2 wks  - CT maxillofacial suggests a primary bone tumor vs metastatic disease  - MRI done and it is likely metastatic disease given findings on L and T spine MRI  Hypertension  - BP is at goal  - Continue Metoprolol 50 mg po Daily as tolerated   Urothelial carcinoma which was recurrent and Renal Cancer - Status-post right nephroureterectomy in March '18, underwent TURBT in early May '18  - Follows with Dr. Tresa Moore of Urology  - Patient has metastatic disease  as above    Iron-deficiency Anemia  - Hgb is stable at 9.1 on admission and no bleeding is evident; Repeat Hb/Hct was 9.0/28.4 - Continue iron-supplementation with Ferrous Sulfate 325 mg po BID -Repeat CBC is Pending   GERD -C/w Pantoprazole 40 mg po Daily  Hyperlipidemia -C/w Pravastatin 80 mg po  Daily  Mild Hypercalcemia -Was mildly elevated at 10.4 and now 10.2 -? Related to Bone Malignancy  -Repeat CMP in AM   Pressure Ulcer on Sacrum  -Mild and poA -WOC Nurse evaluated and left Recc's and appreciate them -Per [facility] nurse turn and reposition patient from side to side-avoiding the supine position.  Additionally, we will provide Pressure Redistribution heel boots to prevent pressure injury while in bed.  A pressure redistribution chair cushion is ordered for use while OOB in chair and post discharge.  Constipation -Scheduled Miralax BID and added Senna Docusate 1 tab po BID -Gave Bisacodyl Suppository 10 mg once.  -Continue to Monitor   DVT prophylaxis: Enoxaparin 40 mg sq q24h Code Status: FULL CODE Family Communication: Discussed with Son at Bedside Disposition Plan: Remain Inpatient as Work Up is in Progress; PT to Evaluate   Consultants:   Neurology   Procedures:  ECHOCARDIOGRAM Study Conclusions  - Procedure narrative: Transthoracic echocardiography. Image   quality was suboptimal. The study was technically difficult, as a   result of poor acoustic windows, poor sound wave transmission,   restricted patient mobility, and body habitus.  Intravenous   contrast (Definity) was administered. - Left ventricle: Ver poor image quality even with definity   Difficult to see endocardium Consider cardiac MRI if clinically   indicated to more acurately asses EF. Wall thickness was   increased in a pattern of severe LVH. Systolic function was   normal. The estimated ejection fraction was in the range of 50%   to 55%. Doppler parameters are consistent with abnormal left   ventricular relaxation (grade 1 diastolic dysfunction). - Atrial septum: No defect or patent foramen ovale was identified.  Antimicrobials:  Anti-infectives    None     Subjective: Seen and examined and was feeling weak and frustrated. Also felt constipated. Had some lower back pain. No nausea or  vomiting. Went for MRI today.   Objective: Vitals:   05/05/17 1356 05/05/17 2358 05/06/17 0420 05/06/17 1003  BP: (!) 103/42 (!) 142/60 135/63 120/72  Pulse: 92 (!) 118 (!) 101 (!) 106  Resp: 18 17 18 17   Temp: 97.8 F (36.6 C) 98.7 F (37.1 C) 98.3 F (36.8 C) 98.5 F (36.9 C)  TempSrc: Oral Oral Oral Oral  SpO2: 97% 94% 95% 97%  Weight:   128.6 kg (283 lb 8.2 oz) 128.6 kg (283 lb 8.2 oz)  Height:        Intake/Output Summary (Last 24 hours) at 05/06/17 2057 Last data filed at 05/06/17 1200  Gross per 24 hour  Intake              360 ml  Output             2140 ml  Net            -1780 ml   Filed Weights   05/05/17 0500 05/06/17 0420 05/06/17 1003  Weight: 129.9 kg (286 lb 6 oz) 128.6 kg (283 lb 8.2 oz) 128.6 kg (283 lb 8.2 oz)   Examination: Physical Exam:  Constitutional: WN/WD obese Caucasian male who appears uncomfortable Eyes: Right eye has lesion under it. Sclerae anicteric ENMT: Grossly normal hearing. External ears were normal; Mucous Membranes appeared moist Neck: Supple with no visible thyromegaly Respiratory: Diminished to Auscultation bilaterally. No wheezing/rales or rhonchi but patient had a cought Cardiovascular: Tacnycardic Rate and Rhythm. 1+ lower extremity edema Abdomen:  Soft, NT, Distended due to body habitus. Bowel sounds present GU: Deferred Musculoskeletal: No cyanosis or contractures Skin: Warm and dry. Right facial lesion nored Neurologic: CN 2-12 grossly intact. No focal deficits on examination but patient was hyperreflexic on Right lower extremity Psychiatric: Frustrated Mood but normal affect. Awake and Alert  Data Reviewed: I have personally reviewed following labs and imaging studies  CBC:  Recent Labs Lab 05/04/17 1859 05/06/17 0533  WBC 7.2 8.4  NEUTROABS 6.0 6.6  HGB 9.1* 9.0*  HCT 28.8* 28.4*  MCV 93.2 92.8  PLT 197 158   Basic Metabolic Panel:  Recent Labs Lab 05/04/17 1859 05/05/17 0530 05/06/17 0533  NA 135 137  135  K 4.2 4.0 3.8  CL 98* 99* 95*  CO2 29 29 31   GLUCOSE 124* 114* 112*  BUN 23* 23* 27*  CREATININE 0.98 0.98 1.07  CALCIUM 10.3 10.4* 10.2  MG  --   --  1.7  PHOS  --   --  3.8   GFR: Estimated Creatinine Clearance: 77.6 mL/min (by C-G formula based on SCr of 1.07 mg/dL). Liver Function Tests:  Recent Labs Lab 05/04/17 1859 05/06/17 0533  AST 39 38  ALT 36 32  ALKPHOS  359* 366*  BILITOT 1.1 1.0  PROT 6.4* 6.1*  ALBUMIN 2.8* 2.6*   No results for input(s): LIPASE, AMYLASE in the last 168 hours. No results for input(s): AMMONIA in the last 168 hours. Coagulation Profile:  Recent Labs Lab 05/04/17 1859  INR 1.29   Cardiac Enzymes:  Recent Labs Lab 05/04/17 1859  TROPONINI <0.03   BNP (last 3 results) No results for input(s): PROBNP in the last 8760 hours. HbA1C: No results for input(s): HGBA1C in the last 72 hours. CBG: No results for input(s): GLUCAP in the last 168 hours. Lipid Profile: No results for input(s): CHOL, HDL, LDLCALC, TRIG, CHOLHDL, LDLDIRECT in the last 72 hours. Thyroid Function Tests: No results for input(s): TSH, T4TOTAL, FREET4, T3FREE, THYROIDAB in the last 72 hours. Anemia Panel: No results for input(s): VITAMINB12, FOLATE, FERRITIN, TIBC, IRON, RETICCTPCT in the last 72 hours. Sepsis Labs: No results for input(s): PROCALCITON, LATICACIDVEN in the last 168 hours.  No results found for this or any previous visit (from the past 240 hour(s)).   Radiology Studies: Dg Eye Foreign Body  Result Date: 05/05/2017 CLINICAL DATA:  Three metallic foci are identified. Correlation with a CT scan from yesterday demonstrates a metallic focus in the skin of the medial right orbit and another in the skin over the left orbit. Again, both appear to be imbedded in the skin. Metallic foci are seen in the nose as well, imbedded in the soft tissues. No other abnormalities. EXAM: ORBITS FOR FOREIGN BODY - 2 VIEW COMPARISON:  None. FINDINGS: Metallic foci  imbedded in the skin of the nose, medial right orbit, and central left orbit. I reviewed the maxillofacial CT with a neuroradiologist. It was decided an MRI of the face would not provide enough additional information to justify the MRI of the face. The MRI of the thoracic and lumbar spine can be performed as long as the patient could communicate any pain or discomfort to the MRI technologist. IMPRESSION: Metallic foci imbedded in the skin of the nose, medial right orbit, and central left orbit should be stable. I reviewed the maxillofacial CT with a neuroradiologist. It was decided an MRI of the face would not provide enough additional information to justify the MRI of the face. The MRI of the thoracic and lumbar spine can be performed in this patient with extremity weakness as long as the patient can communicate any pain or discomfort to the MRI technologist. These findings and recommendations were discussed with the MRI technologist. Electronically Signed   By: Dorise Bullion III M.D   On: 05/05/2017 08:58   Dg Nasal Bones  Result Date: 05/04/2017 CLINICAL DATA:  Question nasal metallic foreign body. EXAM: NASAL BONES - 3+ VIEW COMPARISON:  Face CT earlier this day. FINDINGS: There are 2 metallic foreign bodies projecting over the right and left nasal region, which are in the soft tissues on CT. No nasal bone fracture. IMPRESSION: Tiny metallic densities project over the right and left nasal region, which corresponds soft tissue foreign bodies on CT. Electronically Signed   By: Jeb Levering M.D.   On: 05/04/2017 22:40   Ct Head Wo Contrast  Result Date: 05/04/2017 CLINICAL DATA:  Increased weakness with multiple falls, swelling and bruising to the occipital and right zygomatic arch region EXAM: CT HEAD WITHOUT CONTRAST CT MAXILLOFACIAL WITHOUT CONTRAST TECHNIQUE: Multidetector CT imaging of the head and maxillofacial structures were performed using the standard protocol without intravenous contrast.  Multiplanar CT image reconstructions of the maxillofacial structures were  also generated. COMPARISON:  None. FINDINGS: CT HEAD FINDINGS Brain: No acute territorial infarction, hemorrhage or intracranial mass is seen. Old encephalomalacia involving the right parietal and temporal lobes consistent with old infarct. Moderate periventricular and subcortical white matter small vessel ischemic changes. Moderate atrophy. Prominent ventricles are felt secondary to atrophy. No midline shift. Vascular: No hyperdense vessels.  Carotid artery calcifications. Skull: No fracture or suspicious bone lesion. Prominent arachnoid granulation in the sub occipital bone on the right. Other: Partially visualize mass surrounding the lateral aspect of the right maxillary sinus and the anterior aspect of the zygoma. CT MAXILLOFACIAL FINDINGS Osseous: Mandibular heads are normally position. No mandibular fracture. Zygomatic arches and pterygoid plates are intact. No acute nasal bone fracture. Mild periosteal change involving the anterior zygoma and lateral wall of the right maxillary sinus. Orbits: No orbital wall fracture. No intra or extraconal soft tissue abnormality Sinuses: No acute fluid levels. Mild mucosal thickening in the maxillary and ethmoid sinuses. No sinus wall fracture. Soft tissues: Small metallic densities in the right and left nasal superficial soft tissues. Soft tissue mass centered at the junction of the right maxillary sinus and anterior zygoma, this measures 3.2 by 1.8 cm. IMPRESSION: 1. No CT evidence for acute intracranial abnormality. 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 Moderate white matter small vessel ischemic changes 2. No acute facial bone fracture. 3. 3.2 cm mass surrounding the junction of the right anterolateral maxillary sinus and the anterior aspect of the zygomatic arch with underlying mild periostitis. Findings could relate to metastatic lesion or primary bone tumor. Further evaluation  with MRI is recommended. 4. Superficial metallic densities in the right and left nasal soft tissues for which clinical correlation is recommended Electronically Signed   By: Donavan Foil M.D.   On: 05/04/2017 21:29   Mr Thoracic Spine W Wo Contrast  Result Date: 05/06/2017 CLINICAL DATA:  Generalized weakness with progression over the last several weeks. Personal history bladder cancer. Known metastatic disease. EXAM: MRI THORACIC AND LUMBAR SPINE WITHOUT AND WITH CONTRAST TECHNIQUE: Multiplanar and multiecho pulse sequences of the thoracic and lumbar spine were obtained without and with intravenous contrast. CONTRAST:  45m MULTIHANCE GADOBENATE DIMEGLUMINE 529 MG/ML IV SOLN COMPARISON:  CT of the abdomen and pelvis 01/20/2017. FINDINGS: MRI THORACIC SPINE FINDINGS Alignment: AP alignment is anatomic. Exaggerated kyphosis is present in the lower thoracic spine associated with compression fractures. Rightward curvature of the thoracic spine is centered at T6. Vertebrae: There is diffuse decreased T1 marrow signal. Vertebral body heights are maintained through T11. Enhancing lesion along the superior endplate of TT46measures 13 mm. A 7 mm central enhancing lesion is present at T11. Remote T11 and T12 compression fractures are present without definite enhancement. Tumor is not excluded. Cord:  Normal signal is present throughout the thoracic spinal cord. Paraspinal and other soft tissues: A right paraspinal soft tissue mass is T6-7 measures 5.5 x 5.4 x 2.8 cm. This infiltrates the neural foramina at T6-7 and to lesser stent T6-8. An enlarged retrocrural lymph node on the right measures 10 x 18 mm. Disc levels: No significant disc disease is present. Right foraminal narrowing is present at C6-7 due to tumor infiltration. There is some narrowing at T7-8 is well. Osseous foraminal narrowing is secondary to remote compression fractures and facet hypertrophy at T11-12 on the left. MRI LUMBAR SPINE FINDINGS  Segmentation: 5 non rib-bearing lumbar type vertebral bodies are present. Alignment: AP alignment is anatomic. Rightward curvature is centered at L3. Vertebrae:  Focal tumor enhancement is noted along the superior endplate of L5 on the right measuring 1.7 cm. There is punctate enhancement along the inferior endplate of L3. Remote compression fractures of lumbar spine are most evident at L3-4 and L4-5. The T12 compression fractures noted. Enhancement anteriorly at L1 may reflect metastatic disease versus degenerative change. Multiple enhancing lesions are present within the right iliac bone. Right greater than left sacral lesions are present as well. Scattered hemangiomas are present. Conus medullaris: Extends to the L1 level and appears normal. Paraspinal and other soft tissues: Extensive right para-aortic and renal hilar adenopathy is present. Right nephrectomy is noted. A soft tissue tumor on the right at the L2 level measures 10 x 6 x 8.4 cm. Multiple other smaller para-aortic nodes are present. Hepatic lesions are suspected. The left kidney demonstrates benign appearing exophytic cysts Disc levels: L1-2: Mild central and bilateral foraminal narrowing is secondary to the a broad-based disc protrusion and facet hypertrophy. L2-3: A broad-based disc protrusion is present. Facet hypertrophy leads to moderate central canal stenosis and moderate foramina foraminal narrowing, left greater than right. L3-4: Moderate subarticular narrowing is worse on the right. Mild foraminal narrowing is present bilaterally. L4-5: A broad-based disc protrusion is present. Facet spurring contributes to mild right foraminal narrowing. Mild subarticular narrowing is evident bilaterally. L5-S1: Facet spurring is present bilaterally. Mild foraminal narrowing is worse on the right. IMPRESSION: 1. Multiple metastatic lesions to the thoracic and lumbar spine as described. 2. Right paraspinous soft tissue mass at the T6-7 level with extension  into the T6 and T7 pedicles aunt T6-7 foramen. 3. Retrocrural and para-aortic adenopathy compatible with metastatic disease. 4. 10 cm aortocaval soft tissue mass compatible with metastatic disease. 5. Remote fractures at T12 L3 and L4 do not appear to be pathologic. 6. The largest bone metastases are at L5, T10, and right greater than left sacroiliac joints. Electronically Signed   By: San Morelle M.D.   On: 05/06/2017 18:28   Mr Lumbar Spine W Wo Contrast  Result Date: 05/06/2017 CLINICAL DATA:  Generalized weakness with progression over the last several weeks. Personal history bladder cancer. Known metastatic disease. EXAM: MRI THORACIC AND LUMBAR SPINE WITHOUT AND WITH CONTRAST TECHNIQUE: Multiplanar and multiecho pulse sequences of the thoracic and lumbar spine were obtained without and with intravenous contrast. CONTRAST:  73m MULTIHANCE GADOBENATE DIMEGLUMINE 529 MG/ML IV SOLN COMPARISON:  CT of the abdomen and pelvis 01/20/2017. FINDINGS: MRI THORACIC SPINE FINDINGS Alignment: AP alignment is anatomic. Exaggerated kyphosis is present in the lower thoracic spine associated with compression fractures. Rightward curvature of the thoracic spine is centered at T6. Vertebrae: There is diffuse decreased T1 marrow signal. Vertebral body heights are maintained through T11. Enhancing lesion along the superior endplate of TN05measures 13 mm. A 7 mm central enhancing lesion is present at T11. Remote T11 and T12 compression fractures are present without definite enhancement. Tumor is not excluded. Cord:  Normal signal is present throughout the thoracic spinal cord. Paraspinal and other soft tissues: A right paraspinal soft tissue mass is T6-7 measures 5.5 x 5.4 x 2.8 cm. This infiltrates the neural foramina at T6-7 and to lesser stent T6-8. An enlarged retrocrural lymph node on the right measures 10 x 18 mm. Disc levels: No significant disc disease is present. Right foraminal narrowing is present at C6-7 due  to tumor infiltration. There is some narrowing at T7-8 is well. Osseous foraminal narrowing is secondary to remote compression fractures and facet hypertrophy at T11-12 on  the left. MRI LUMBAR SPINE FINDINGS Segmentation: 5 non rib-bearing lumbar type vertebral bodies are present. Alignment: AP alignment is anatomic. Rightward curvature is centered at L3. Vertebrae: Focal tumor enhancement is noted along the superior endplate of L5 on the right measuring 1.7 cm. There is punctate enhancement along the inferior endplate of L3. Remote compression fractures of lumbar spine are most evident at L3-4 and L4-5. The T12 compression fractures noted. Enhancement anteriorly at L1 may reflect metastatic disease versus degenerative change. Multiple enhancing lesions are present within the right iliac bone. Right greater than left sacral lesions are present as well. Scattered hemangiomas are present. Conus medullaris: Extends to the L1 level and appears normal. Paraspinal and other soft tissues: Extensive right para-aortic and renal hilar adenopathy is present. Right nephrectomy is noted. A soft tissue tumor on the right at the L2 level measures 10 x 6 x 8.4 cm. Multiple other smaller para-aortic nodes are present. Hepatic lesions are suspected. The left kidney demonstrates benign appearing exophytic cysts Disc levels: L1-2: Mild central and bilateral foraminal narrowing is secondary to the a broad-based disc protrusion and facet hypertrophy. L2-3: A broad-based disc protrusion is present. Facet hypertrophy leads to moderate central canal stenosis and moderate foramina foraminal narrowing, left greater than right. L3-4: Moderate subarticular narrowing is worse on the right. Mild foraminal narrowing is present bilaterally. L4-5: A broad-based disc protrusion is present. Facet spurring contributes to mild right foraminal narrowing. Mild subarticular narrowing is evident bilaterally. L5-S1: Facet spurring is present bilaterally.  Mild foraminal narrowing is worse on the right. IMPRESSION: 1. Multiple metastatic lesions to the thoracic and lumbar spine as described. 2. Right paraspinous soft tissue mass at the T6-7 level with extension into the T6 and T7 pedicles aunt T6-7 foramen. 3. Retrocrural and para-aortic adenopathy compatible with metastatic disease. 4. 10 cm aortocaval soft tissue mass compatible with metastatic disease. 5. Remote fractures at T12 L3 and L4 do not appear to be pathologic. 6. The largest bone metastases are at L5, T10, and right greater than left sacroiliac joints. Electronically Signed   By: San Morelle M.D.   On: 05/06/2017 18:28   Ct Maxillofacial Wo Contrast  Result Date: 05/04/2017 CLINICAL DATA:  Increased weakness with multiple falls, swelling and bruising to the occipital and right zygomatic arch region EXAM: CT HEAD WITHOUT CONTRAST CT MAXILLOFACIAL WITHOUT CONTRAST TECHNIQUE: Multidetector CT imaging of the head and maxillofacial structures were performed using the standard protocol without intravenous contrast. Multiplanar CT image reconstructions of the maxillofacial structures were also generated. COMPARISON:  None. FINDINGS: CT HEAD FINDINGS Brain: No acute territorial infarction, hemorrhage or intracranial mass is seen. Old encephalomalacia involving the right parietal and temporal lobes consistent with old infarct. Moderate periventricular and subcortical white matter small vessel ischemic changes. Moderate atrophy. Prominent ventricles are felt secondary to atrophy. No midline shift. Vascular: No hyperdense vessels.  Carotid artery calcifications. Skull: No fracture or suspicious bone lesion. Prominent arachnoid granulation in the sub occipital bone on the right. Other: Partially visualize mass surrounding the lateral aspect of the right maxillary sinus and the anterior aspect of the zygoma. CT MAXILLOFACIAL FINDINGS Osseous: Mandibular heads are normally position. No mandibular fracture.  Zygomatic arches and pterygoid plates are intact. No acute nasal bone fracture. Mild periosteal change involving the anterior zygoma and lateral wall of the right maxillary sinus. Orbits: No orbital wall fracture. No intra or extraconal soft tissue abnormality Sinuses: No acute fluid levels. Mild mucosal thickening in the maxillary and ethmoid sinuses. No  sinus wall fracture. Soft tissues: Small metallic densities in the right and left nasal superficial soft tissues. Soft tissue mass centered at the junction of the right maxillary sinus and anterior zygoma, this measures 3.2 by 1.8 cm. IMPRESSION: 1. No CT evidence for acute intracranial abnormality. 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 Moderate white matter small vessel ischemic changes 2. No acute facial bone fracture. 3. 3.2 cm mass surrounding the junction of the right anterolateral maxillary sinus and the anterior aspect of the zygomatic arch with underlying mild periostitis. Findings could relate to metastatic lesion or primary bone tumor. Further evaluation with MRI is recommended. 4. Superficial metallic densities in the right and left nasal soft tissues for which clinical correlation is recommended Electronically Signed   By: Donavan Foil M.D.   On: 05/04/2017 21:29   Scheduled Meds: . allopurinol  100 mg Oral Daily  . calcium-vitamin D  1 tablet Oral Q breakfast  . chlorhexidine  15 mL Mouth Rinse BID  . dextromethorphan-guaiFENesin  1 tablet Oral BID  . enoxaparin (LOVENOX) injection  40 mg Subcutaneous Q24H  . ferrous sulfate  325 mg Oral BID WC  . furosemide  40 mg Intravenous Q12H  . ipratropium  0.5 mg Nebulization Q6H  . levalbuterol  0.63 mg Nebulization Q6H  . mouth rinse  15 mL Mouth Rinse q12n4p  . metoprolol tartrate  50 mg Oral Daily  . pantoprazole  40 mg Oral Daily  . polyethylene glycol  17 g Oral BID  . pravastatin  80 mg Oral q1800  . senna-docusate  1 tablet Oral BID  . sodium chloride flush  3 mL  Intravenous Q12H   Continuous Infusions: . sodium chloride      LOS: 2 days   Kerney Elbe, DO [facility] Pager [phone]  If 7PM-7AM, please contact night-coverage [email or web address] Password [facility] 05/06/2017, 8:57 PM

## 2017-05-07 DIAGNOSIS — G893 Neoplasm related pain (acute) (chronic): Secondary | ICD-10-CM

## 2017-05-07 DIAGNOSIS — D649 Anemia, unspecified: Secondary | ICD-10-CM

## 2017-05-07 DIAGNOSIS — R531 Weakness: Secondary | ICD-10-CM

## 2017-05-07 DIAGNOSIS — C641 Malignant neoplasm of right kidney, except renal pelvis: Secondary | ICD-10-CM

## 2017-05-07 DIAGNOSIS — D492 Neoplasm of unspecified behavior of bone, soft tissue, and skin: Secondary | ICD-10-CM

## 2017-05-07 DIAGNOSIS — Z9181 History of falling: Secondary | ICD-10-CM

## 2017-05-07 DIAGNOSIS — R1909 Other intra-abdominal and pelvic swelling, mass and lump: Secondary | ICD-10-CM

## 2017-05-07 DIAGNOSIS — K7460 Unspecified cirrhosis of liver: Secondary | ICD-10-CM

## 2017-05-07 DIAGNOSIS — I509 Heart failure, unspecified: Secondary | ICD-10-CM

## 2017-05-07 DIAGNOSIS — M899 Disorder of bone, unspecified: Secondary | ICD-10-CM

## 2017-05-07 DIAGNOSIS — R220 Localized swelling, mass and lump, head: Secondary | ICD-10-CM

## 2017-05-07 DIAGNOSIS — R599 Enlarged lymph nodes, unspecified: Secondary | ICD-10-CM

## 2017-05-07 LAB — COMPREHENSIVE METABOLIC PANEL
ALBUMIN: 2.5 g/dL — AB (ref 3.5–5.0)
ALT: 29 U/L (ref 17–63)
AST: 34 U/L (ref 15–41)
Alkaline Phosphatase: 326 U/L — ABNORMAL HIGH (ref 38–126)
Anion gap: 12 (ref 5–15)
BUN: 30 mg/dL — ABNORMAL HIGH (ref 6–20)
CHLORIDE: 94 mmol/L — AB (ref 101–111)
CO2: 29 mmol/L (ref 22–32)
CREATININE: 1.08 mg/dL (ref 0.61–1.24)
Calcium: 10 mg/dL (ref 8.9–10.3)
GFR calc non Af Amer: >60 mL/min (ref >60)
GLUCOSE: 95 mg/dL (ref 65–99)
Potassium: 3.9 mmol/L (ref 3.5–5.1)
SODIUM: 135 mmol/L (ref 135–145)
Total Bilirubin: 1.2 mg/dL (ref 0.3–1.2)
Total Protein: 5.9 g/dL — ABNORMAL LOW (ref 6.5–8.1)

## 2017-05-07 LAB — CBC WITH DIFFERENTIAL/PLATELET
BASOS ABS: 0 10*3/uL (ref 0.0–0.1)
BASOS PCT: 0 %
EOS ABS: 0 10*3/uL (ref 0.0–0.7)
EOS PCT: 0 %
HCT: 27.4 % — ABNORMAL LOW (ref 39.0–52.0)
HEMOGLOBIN: 8.7 g/dL — AB (ref 13.0–17.0)
Lymphocytes Relative: 6 %
Lymphs Abs: 0.6 10*3/uL — ABNORMAL LOW (ref 0.7–4.0)
MCH: 29.1 pg (ref 26.0–34.0)
MCHC: 31.8 g/dL (ref 30.0–36.0)
MCV: 91.6 fL (ref 78.0–100.0)
Monocytes Absolute: 1 10*3/uL (ref 0.1–1.0)
Monocytes Relative: 10 %
Neutro Abs: 8.3 10*3/uL — ABNORMAL HIGH (ref 1.7–7.7)
Neutrophils Relative %: 84 %
PLATELETS: 208 10*3/uL (ref 150–400)
RBC: 2.99 MIL/uL — AB (ref 4.22–5.81)
RDW: 15 % (ref 11.5–15.5)
WBC: 9.9 10*3/uL (ref 4.0–10.5)

## 2017-05-07 LAB — PHOSPHORUS: PHOSPHORUS: 3.5 mg/dL (ref 2.5–4.6)

## 2017-05-07 LAB — MAGNESIUM: Magnesium: 1.6 mg/dL — ABNORMAL LOW (ref 1.7–2.4)

## 2017-05-07 MED ORDER — PANTOPRAZOLE SODIUM 40 MG PO TBEC
40.0000 mg | DELAYED_RELEASE_TABLET | Freq: Two times a day (BID) | ORAL | Status: DC
  Administered 2017-05-07 – 2017-05-10 (×7): 40 mg via ORAL
  Filled 2017-05-07 (×7): qty 1

## 2017-05-07 MED ORDER — MAGNESIUM SULFATE 2 GM/50ML IV SOLN
2.0000 g | Freq: Once | INTRAVENOUS | Status: AC
  Administered 2017-05-07: 2 g via INTRAVENOUS
  Filled 2017-05-07: qty 50

## 2017-05-07 MED ORDER — ENOXAPARIN SODIUM 40 MG/0.4ML ~~LOC~~ SOLN
40.0000 mg | SUBCUTANEOUS | Status: DC
  Administered 2017-05-08 – 2017-05-09 (×2): 40 mg via SUBCUTANEOUS
  Filled 2017-05-07 (×2): qty 0.4

## 2017-05-07 MED ORDER — DEXAMETHASONE SODIUM PHOSPHATE 10 MG/ML IJ SOLN
10.0000 mg | Freq: Two times a day (BID) | INTRAMUSCULAR | Status: DC
  Administered 2017-05-07 – 2017-05-09 (×5): 10 mg via INTRAVENOUS
  Filled 2017-05-07 (×5): qty 1

## 2017-05-07 NOTE — Consult Note (Signed)
New Hematology/Oncology Consult   Referral TM:[id number] Sheikh        Reason for Referral: Urothelial Carcinoma  HPI: [name] was diagnosed with noninvasive bladder cancer in 2015. He underwent transurethral resection followed by BCG therapy. He has been treated for multiple recurrences of noninvasive bladder carcinoma, last in October 2016. In December 2017 he was found to have a new right renal pelvis mass on evaluation for gross hematuria. He underwent a biopsy in January 2018 that confirmed a high-grade urothelial carcinoma. He underwent a right nephroureterectomy 02/01/2017. The pathology confirmed a high-grade invasive urothelial carcinoma of the right kidney with tumor invading the perinephric fat (T4). Lymphovascular invasion was noted. He underwent repeat surgery 03/29/2017 when he had gross hematuria with clot retention.. Early recurrence of papillary tumor was noted at the prostatic mucosa. This was resected and revealed urothelial carcinoma.  He presented to the emergency room 05/04/2017 with generalized weakness progressing over several weeks. He has experienced multiple falls and weakness of the right leg. He was noted to have a mass at the right face. This was confirmed by CT. MRIs of the thoracic and lumbar spine on 05/06/2012 confirmed multiple metastatic lesions. There is a right paraspinous soft tissue mass at T6-7, retrocrural and periaortic adenopathy, and a 10 cm aortocaval mass. Pathologic fractures at T12, L3, and L4 appeared remote and nonpathologic. Metastases were noted at the right greater than left sacroiliac joints.     Past Medical History:  Diagnosis Date  . Anemia   . Arthritis   . Bladder cancer (HCC)-Urothelial carcinoma     RECURRENT  . BPH (benign prostatic hypertrophy)   . CHF (congestive heart failure) ([place])   . Clot 2007    kidney biopsy done  . Complication of anesthesia    oxygen level drops when put to sleep , has some bleeding issues after  every surgery with cautery required  . COPD (chronic obstructive pulmonary disease) ([place])   . Dyspnea    exertion; can not complete a flight of stairs without stopping to catch his breath   . Full dentures   . GERD (gastroesophageal reflux disease)   . History of blood transfusion last done 11-13-16   total of 7 units   . History of compression fracture of spine    04/2013   T12  . Hyperlipidemia   . Hypertension   . Nephritis 2007   oral chemo and prednisone done  . Pneumonia last 2016   x 2 in 2016  . PONV (postoperative nausea and vomiting)   . Pulmonary hypertension ([place])    mild per 12-15-15 echo [facility]  . Renal mass-High-grade invasive urothelial carcinoma  January 2018    right  . Sleep apnea   :  Past Surgical History:  Procedure Laterality Date  . APPENDECTOMY  1985  . CARPAL TUNNEL RELEASE Right 02-05-2008  . CYSTOSCOPY W/ RETROGRADES Bilateral 10/29/2014   Procedure: CYSTOSCOPY WITH RETROGRADE PYELOGRAM;  Surgeon: Alexis Frock, MD;  Location: [facility];  Service: Urology;  Laterality: Bilateral;  . CYSTOSCOPY W/ RETROGRADES Bilateral 09/10/2015   Procedure: CYSTOSCOPY WITH RETROGRADE PYELOGRAM;  Surgeon: Alexis Frock, MD;  Location: [facility];  Service: Urology;  Laterality: Bilateral;  . CYSTOSCOPY W/ URETERAL STENT PLACEMENT N/A 03/29/2017   Procedure: CYSTOSCOPY WITH CLOT EVACUATION transurethral resection bladder tumor retrograde pylegram left ureter;  Surgeon: Alexis Frock, MD;  Location: [facility];  Service: Urology;  Laterality: N/A;  . CYSTOSCOPY WITH BIOPSY N/A 05/26/2014  Procedure: CYSTOSCOPY WITH BIOPSY;  Surgeon: Sharyn Creamer, MD;  Location: [facility];  Service: Urology;  Laterality: N/A;  . CYSTOSCOPY WITH RETROGRADE PYELOGRAM, URETEROSCOPY AND STENT PLACEMENT Bilateral 04/09/2014   Procedure: CYSTOSCOPY WITH RETROGRADE PYELOGRAM, POSSIBLE URETEROSCOPY WITH BIOPSY AND STENT PLACEMENT;   Surgeon: Sharyn Creamer, MD;  Location: [facility];  Service: Urology;  Laterality: Bilateral;  . CYSTOSCOPY WITH RETROGRADE PYELOGRAM, URETEROSCOPY AND STENT PLACEMENT Right 02/01/2017   Procedure: CYSTOSCOPY WITH RIGHT  RETROGRADE PYELOGRAM  AND RIGHT STENT PLACEMENT;  Surgeon: Alexis Frock, MD;  Location: [facility];  Service: Urology;  Laterality: Right;  . CYSTOSCOPY/RETROGRADE/URETEROSCOPY Right 12/28/2016   Procedure: CYSTOSCOPY/RETROGRADE/URETEROSCOPY WITH BIOPSY right renal pelvis insertion double j stent;  Surgeon: Alexis Frock, MD;  Location: [facility];  Service: Urology;  Laterality: Right;  . KNEE LIGAMENT RECONSTRUCTION Right 1970  . RIGHT SHOULDER SURGERY  2011  . ROBOT ASSITED LAPAROSCOPIC NEPHROURETERECTOMY Right 02/01/2017   Procedure: XI ROBOT ASSITED LAPAROSCOPIC NEPHROURETERECTOMY;  Surgeon: Alexis Frock, MD;  Location: [facility];  Service: Urology;  Laterality: Right;  . TONSILLECTOMY AND ADENOIDECTOMY  CHILD   and adenoids  . TRANSURETHRAL RESECTION OF BLADDER TUMOR WITH GYRUS (TURBT-GYRUS) Bilateral 04/09/2014   Procedure: TRANSURETHRAL RESECTION OF BLADDER TUMOR WITH GYRUS (TURBT-GYRUS);  Surgeon: Sharyn Creamer, MD;  Location: [facility];  Service: Urology;  Laterality: Bilateral;  . TRANSURETHRAL RESECTION OF BLADDER TUMOR WITH GYRUS (TURBT-GYRUS) N/A 10/29/2014   Procedure: TRANSURETHRAL RESECTION OF BLADDER TUMOR WITH GYRUS (TURBT-GYRUS);  Surgeon: Alexis Frock, MD;  Location: [facility];  Service: Urology;  Laterality: N/A;  . TRANSURETHRAL RESECTION OF BLADDER TUMOR WITH GYRUS (TURBT-GYRUS) N/A 09/10/2015   Procedure: TRANSURETHRAL RESECTION OF BLADDER TUMOR ;  Surgeon: Alexis Frock, MD;  Location[facility];  Service: Urology;  Laterality: N/A;  . TRANSURETHRAL RESECTION OF PROSTATE  2006   AND    POST CAURTERIZATION OF BLEEDERS  . TRANSURETHRAL RESECTION OF PROSTATE N/A 09/10/2015   Procedure:  TRANSURETHRAL RESECTION OF THE PROSTATE ;  Surgeon: Alexis Frock, MD;  Location: [facility];  Service: Urology;  Laterality: N/A;  :   Current Facility-Administered Medications:  .  0.9 %  sodium chloride infusion, 250 mL, Intravenous, PRN, Opyd, Ilene Qua, MD .  acetaminophen (TYLENOL) tablet 650 mg, 650 mg, Oral, Q4H PRN, Opyd, Ilene Qua, MD, 650 mg at 05/05/17 0025 .  allopurinol (ZYLOPRIM) tablet 100 mg, 100 mg, Oral, Daily, Opyd, Ilene Qua, MD, 100 mg at 05/06/17 1037 .  calcium-vitamin D (OSCAL WITH D) 500-200 MG-UNIT per tablet 1 tablet, 1 tablet, Oral, Q breakfast, Opyd, Ilene Qua, MD, 1 tablet at 05/06/17 1035 .  chlorhexidine (PERIDEX) 0.12 % solution 15 mL, 15 mL, Mouth Rinse, BID, Opyd, Ilene Qua, MD, 15 mL at 05/06/17 2128 .  dexamethasone (DECADRON) injection 10 mg, 10 mg, Intravenous, Q12H, Sheikh, Omair Latif, DO .  dextromethorphan-guaiFENesin [facility] DM) 30-600 MG per 12 hr tablet 1 tablet, 1 tablet, Oral, BID, Raiford Noble [place], [place], 1 tablet at 05/06/17 2128 .  enoxaparin (LOVENOX) injection 40 mg, 40 mg, Subcutaneous, Q24H, Opyd, Ilene Qua, MD, 40 mg at 05/06/17 2129 .  fentaNYL (SUBLIMAZE) injection 25-50 mcg, 25-50 mcg, Intravenous, Q2H PRN, Opyd, Ilene Qua, MD, 25 mcg at 05/05/17 2035 .  ferrous sulfate tablet 325 mg, 325 mg, Oral, BID WC, Opyd, Ilene Qua, MD, 325 mg at 05/06/17 2127 .  furosemide (LASIX) injection 40 mg, 40 mg, Intravenous, Q12H, Opyd, Christia Reading  S, MD, 40 mg at 05/07/17 0518 .  HYDROcodone-acetaminophen (NORCO/VICODIN) 5-325 MG per tablet 1 tablet, 1 tablet, Oral, Q4H PRN, Opyd, Ilene Qua, MD, 1 tablet at 05/05/17 2148 .  ipratropium (ATROVENT) nebulizer solution 0.5 mg, 0.5 mg, Nebulization, TID, Sheikh, Omair Latif, DO .  levalbuterol [facility]) nebulizer solution 0.63 mg, 0.63 mg, Nebulization, TID, Sheikh, Omair Latif, DO .  magnesium sulfate IVPB 2 g 50 mL, 2 g, Intravenous, Once, Sheikh, [place], DO .  MEDLINE mouth rinse, 15  mL, Mouth Rinse, q12n4p, Opyd, Ilene Qua, MD, 15 mL at 05/05/17 1531 .  metoprolol tartrate (LOPRESSOR) tablet 50 mg, 50 mg, Oral, Daily, Opyd, Ilene Qua, MD, 50 mg at 05/06/17 1038 .  ondansetron (ZOFRAN) injection 4 mg, 4 mg, Intravenous, Q6H PRN, Opyd, Timothy S, MD .  pantoprazole (PROTONIX) EC tablet 40 mg, 40 mg, Oral, BID, Sheikh, Omair Latif, DO .  polyethylene glycol (MIRALAX / GLYCOLAX) packet 17 g, 17 g, Oral, BID, Raiford Noble [place], [place], 17 g at 05/06/17 2129 .  pravastatin (PRAVACHOL) tablet 80 mg, 80 mg, Oral, q1800, Opyd, Ilene Qua, MD, 80 mg at 05/06/17 2128 .  senna-docusate (Senokot-S) tablet 1 tablet, 1 tablet, Oral, BID, Raiford Noble [place], [place], 1 tablet at 05/06/17 2128 .  sodium chloride flush (NS) 0.9 % injection 3 mL, 3 mL, Intravenous, Q12H, Opyd, Ilene Qua, MD, 3 mL at 05/06/17 2134 .  sodium chloride flush (NS) 0.9 % injection 3 mL, 3 mL, Intravenous, PRN, Opyd, Ilene Qua, MD:  . allopurinol  100 mg Oral Daily  . calcium-vitamin D  1 tablet Oral Q breakfast  . chlorhexidine  15 mL Mouth Rinse BID  . dexamethasone  10 mg Intravenous Q12H  . dextromethorphan-guaiFENesin  1 tablet Oral BID  . enoxaparin (LOVENOX) injection  40 mg Subcutaneous Q24H  . ferrous sulfate  325 mg Oral BID WC  . furosemide  40 mg Intravenous Q12H  . ipratropium  0.5 mg Nebulization TID  . levalbuterol  0.63 mg Nebulization TID  . mouth rinse  15 mL Mouth Rinse q12n4p  . metoprolol tartrate  50 mg Oral Daily  . pantoprazole  40 mg Oral BID  . polyethylene glycol  17 g Oral BID  . pravastatin  80 mg Oral q1800  . senna-docusate  1 tablet Oral BID  . sodium chloride flush  3 mL Intravenous Q12H  :  Allergies  Allergen Reactions  . Bee Venom Anaphylaxis  . Penicillins Anaphylaxis, Hives and Other (See Comments)    Has patient had a PCN reaction causing immediate rash, facial/tongue/throat swelling, SOB or lightheadedness with hypotension: Yes Has patient had a PCN reaction causing  severe rash involving mucus membranes or skin necrosis: Yes Has patient had a PCN reaction that required hospitalization No Has patient had a PCN reaction occurring within the last 10 years: No If all of the above answers are "NO", then may proceed with Cephalosporin use.   . Tramadol Other (See Comments)    Dizzy and loopy.   :  PI:[id number]   SOCIAL HISTORY: He lives with his wife in [place]. he quit smoking cigarettes in the remote past. No current alcohol use.    Review of Systems:  Positives include:Right leg weakness, diffuse back pain, anterior chest pain  A complete ROS was otherwise negative.   Physical Exam:  Blood pressure (!) 105/50, pulse (!) 114, temperature 99.5 F (37.5 C), temperature source Oral, resp. rate 20, height 6' (1.829 m), weight 283 lb 15.2 oz (  128.8 kg), SpO2 93 %.  HEENT: Neck without mass, mass at the right zygomas Lungs: Inspiratory rhonchi at the lower posterior chest bilaterally, no respiratory distress  Cardiac: Regular rate and rhythm  Abdomen: No hepatomegaly, no mass, nontender   Vascular: No leg edema  Lymph nodes: No cervical, right supraclavicular, axillary, or inguinal nodes. Firm left supraclavicular node  Neurologic: Alert and oriented, the motor exam appears intact in the upper extremities bilaterally, weakness with flexion at the right hip  Skin: No rash  Musculoskeletal: Diffuse tenderness of the spine  LABS:   Recent Labs  05/06/17 0533 05/07/17 0507  WBC 8.4 9.9  HGB 9.0* 8.7*  HCT 28.4* 27.4*  PLT 196 208     Recent Labs  05/06/17 0533 05/07/17 0507  NA 135 135  K 3.8 3.9  CL 95* 94*  CO2 31 29  GLUCOSE 112* 95  BUN 27* 30*  CREATININE 1.07 1.08  CALCIUM 10.2 10.0      RADIOLOGY:  Dg Eye Foreign Body  Result Date: 05/05/2017 CLINICAL DATA:  Three metallic foci are identified. Correlation with a CT scan from yesterday demonstrates a metallic focus in the skin of the medial right orbit and another  in the skin over the left orbit. Again, both appear to be imbedded in the skin. Metallic foci are seen in the nose as well, imbedded in the soft tissues. No other abnormalities. EXAM: ORBITS FOR FOREIGN BODY - 2 VIEW COMPARISON:  None. FINDINGS: Metallic foci imbedded in the skin of the nose, medial right orbit, and central left orbit. I reviewed the maxillofacial CT with a neuroradiologist. It was decided an MRI of the face would not provide enough additional information to justify the MRI of the face. The MRI of the thoracic and lumbar spine can be performed as long as the patient could communicate any pain or discomfort to the MRI technologist. IMPRESSION: Metallic foci imbedded in the skin of the nose, medial right orbit, and central left orbit should be stable. I reviewed the maxillofacial CT with a neuroradiologist. It was decided an MRI of the face would not provide enough additional information to justify the MRI of the face. The MRI of the thoracic and lumbar spine can be performed in this patient with extremity weakness as long as the patient can communicate any pain or discomfort to the MRI technologist. These findings and recommendations were discussed with the MRI technologist. Electronically Signed   By: Dorise Bullion III M.D   On: 05/05/2017 08:58   Dg Nasal Bones  Result Date: 05/04/2017 CLINICAL DATA:  Question nasal metallic foreign body. EXAM: NASAL BONES - 3+ VIEW COMPARISON:  Face CT earlier this day. FINDINGS: There are 2 metallic foreign bodies projecting over the right and left nasal region, which are in the soft tissues on CT. No nasal bone fracture. IMPRESSION: Tiny metallic densities project over the right and left nasal region, which corresponds soft tissue foreign bodies on CT. Electronically Signed   By: Jeb Levering M.D.   On: 05/04/2017 22:40   Dg Chest 2 View  Result Date: 05/04/2017 CLINICAL DATA:  Dyspnea cough. EXAM: CHEST  2 VIEW COMPARISON:  01/01/2017 FINDINGS: AP  and lateral views of the chest show low volumes with cardiomegaly. There is vascular congestion with diffuse interstitial opacity compatible with edema. Right base atelectasis or infiltrate noted. Small right pleural effusion. The visualized bony structures of the thorax are intact. Telemetry leads overlie the chest. IMPRESSION: Cardiomegaly with interstitial pulmonary edema.  Right base atelectasis with small right pleural effusion. Electronically Signed   By: Misty Stanley M.D.   On: 05/04/2017 20:41   Ct Head Wo Contrast  Result Date: 05/04/2017 CLINICAL DATA:  Increased weakness with multiple falls, swelling and bruising to the occipital and right zygomatic arch region EXAM: CT HEAD WITHOUT CONTRAST CT MAXILLOFACIAL WITHOUT CONTRAST TECHNIQUE: Multidetector CT imaging of the head and maxillofacial structures were performed using the standard protocol without intravenous contrast. Multiplanar CT image reconstructions of the maxillofacial structures were also generated. COMPARISON:  None. FINDINGS: CT HEAD FINDINGS Brain: No acute territorial infarction, hemorrhage or intracranial mass is seen. Old encephalomalacia involving the right parietal and temporal lobes consistent with old infarct. Moderate periventricular and subcortical white matter small vessel ischemic changes. Moderate atrophy. Prominent ventricles are felt secondary to atrophy. No midline shift. Vascular: No hyperdense vessels.  Carotid artery calcifications. Skull: No fracture or suspicious bone lesion. Prominent arachnoid granulation in the sub occipital bone on the right. Other: Partially visualize mass surrounding the lateral aspect of the right maxillary sinus and the anterior aspect of the zygoma. CT MAXILLOFACIAL FINDINGS Osseous: Mandibular heads are normally position. No mandibular fracture. Zygomatic arches and pterygoid plates are intact. No acute nasal bone fracture. Mild periosteal change involving the anterior zygoma and lateral wall  of the right maxillary sinus. Orbits: No orbital wall fracture. No intra or extraconal soft tissue abnormality Sinuses: No acute fluid levels. Mild mucosal thickening in the maxillary and ethmoid sinuses. No sinus wall fracture. Soft tissues: Small metallic densities in the right and left nasal superficial soft tissues. Soft tissue mass centered at the junction of the right maxillary sinus and anterior zygoma, this measures 3.2 by 1.8 cm. IMPRESSION: 1. No CT evidence for acute intracranial abnormality. 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 Moderate white matter small vessel ischemic changes 2. No acute facial bone fracture. 3. 3.2 cm mass surrounding the junction of the right anterolateral maxillary sinus and the anterior aspect of the zygomatic arch with underlying mild periostitis. Findings could relate to metastatic lesion or primary bone tumor. Further evaluation with MRI is recommended. 4. Superficial metallic densities in the right and left nasal soft tissues for which clinical correlation is recommended Electronically Signed   By: Donavan Foil M.D.   On: 05/04/2017 21:29   Mr Thoracic Spine W Wo Contrast  Result Date: 05/06/2017 CLINICAL DATA:  Generalized weakness with progression over the last several weeks. Personal history bladder cancer. Known metastatic disease. EXAM: MRI THORACIC AND LUMBAR SPINE WITHOUT AND WITH CONTRAST TECHNIQUE: Multiplanar and multiecho pulse sequences of the thoracic and lumbar spine were obtained without and with intravenous contrast. CONTRAST:  87mL MULTIHANCE GADOBENATE DIMEGLUMINE 529 MG/ML IV SOLN COMPARISON:  CT of the abdomen and pelvis 01/20/2017. FINDINGS: MRI THORACIC SPINE FINDINGS Alignment: AP alignment is anatomic. Exaggerated kyphosis is present in the lower thoracic spine associated with compression fractures. Rightward curvature of the thoracic spine is centered at T6. Vertebrae: There is diffuse decreased T1 marrow signal. Vertebral body  heights are maintained through T11. Enhancing lesion along the superior endplate of U93 measures 13 mm. A 7 mm central enhancing lesion is present at T11. Remote T11 and T12 compression fractures are present without definite enhancement. Tumor is not excluded. Cord:  Normal signal is present throughout the thoracic spinal cord. Paraspinal and other soft tissues: A right paraspinal soft tissue mass is T6-7 measures 5.5 x 5.4 x 2.8 cm. This infiltrates the neural foramina at T6-7  and to lesser stent T6-8. An enlarged retrocrural lymph node on the right measures 10 x 18 mm. Disc levels: No significant disc disease is present. Right foraminal narrowing is present at C6-7 due to tumor infiltration. There is some narrowing at T7-8 is well. Osseous foraminal narrowing is secondary to remote compression fractures and facet hypertrophy at T11-12 on the left. MRI LUMBAR SPINE FINDINGS Segmentation: 5 non rib-bearing lumbar type vertebral bodies are present. Alignment: AP alignment is anatomic. Rightward curvature is centered at L3. Vertebrae: Focal tumor enhancement is noted along the superior endplate of L5 on the right measuring 1.7 cm. There is punctate enhancement along the inferior endplate of L3. Remote compression fractures of lumbar spine are most evident at L3-4 and L4-5. The T12 compression fractures noted. Enhancement anteriorly at L1 may reflect metastatic disease versus degenerative change. Multiple enhancing lesions are present within the right iliac bone. Right greater than left sacral lesions are present as well. Scattered hemangiomas are present. Conus medullaris: Extends to the L1 level and appears normal. Paraspinal and other soft tissues: Extensive right para-aortic and renal hilar adenopathy is present. Right nephrectomy is noted. A soft tissue tumor on the right at the L2 level measures 10 x 6 x 8.4 cm. Multiple other smaller para-aortic nodes are present. Hepatic lesions are suspected. The left kidney  demonstrates benign appearing exophytic cysts Disc levels: L1-2: Mild central and bilateral foraminal narrowing is secondary to the a broad-based disc protrusion and facet hypertrophy. L2-3: A broad-based disc protrusion is present. Facet hypertrophy leads to moderate central canal stenosis and moderate foramina foraminal narrowing, left greater than right. L3-4: Moderate subarticular narrowing is worse on the right. Mild foraminal narrowing is present bilaterally. L4-5: A broad-based disc protrusion is present. Facet spurring contributes to mild right foraminal narrowing. Mild subarticular narrowing is evident bilaterally. L5-S1: Facet spurring is present bilaterally. Mild foraminal narrowing is worse on the right. IMPRESSION: 1. Multiple metastatic lesions to the thoracic and lumbar spine as described. 2. Right paraspinous soft tissue mass at the T6-7 level with extension into the T6 and T7 pedicles aunt T6-7 foramen. 3. Retrocrural and para-aortic adenopathy compatible with metastatic disease. 4. 10 cm aortocaval soft tissue mass compatible with metastatic disease. 5. Remote fractures at T12 L3 and L4 do not appear to be pathologic. 6. The largest bone metastases are at L5, T10, and right greater than left sacroiliac joints. Electronically Signed   By: San Morelle M.D.   On: 05/06/2017 18:28   Mr Lumbar Spine W Wo Contrast  Result Date: 05/06/2017 CLINICAL DATA:  Generalized weakness with progression over the last several weeks. Personal history bladder cancer. Known metastatic disease. EXAM: MRI THORACIC AND LUMBAR SPINE WITHOUT AND WITH CONTRAST TECHNIQUE: Multiplanar and multiecho pulse sequences of the thoracic and lumbar spine were obtained without and with intravenous contrast. CONTRAST:  58mL MULTIHANCE GADOBENATE DIMEGLUMINE 529 MG/ML IV SOLN COMPARISON:  CT of the abdomen and pelvis 01/20/2017. FINDINGS: MRI THORACIC SPINE FINDINGS Alignment: AP alignment is anatomic. Exaggerated kyphosis is  present in the lower thoracic spine associated with compression fractures. Rightward curvature of the thoracic spine is centered at T6. Vertebrae: There is diffuse decreased T1 marrow signal. Vertebral body heights are maintained through T11. Enhancing lesion along the superior endplate of W97 measures 13 mm. A 7 mm central enhancing lesion is present at T11. Remote T11 and T12 compression fractures are present without definite enhancement. Tumor is not excluded. Cord:  Normal signal is present throughout the thoracic spinal  cord. Paraspinal and other soft tissues: A right paraspinal soft tissue mass is T6-7 measures 5.5 x 5.4 x 2.8 cm. This infiltrates the neural foramina at T6-7 and to lesser stent T6-8. An enlarged retrocrural lymph node on the right measures 10 x 18 mm. Disc levels: No significant disc disease is present. Right foraminal narrowing is present at C6-7 due to tumor infiltration. There is some narrowing at T7-8 is well. Osseous foraminal narrowing is secondary to remote compression fractures and facet hypertrophy at T11-12 on the left. MRI LUMBAR SPINE FINDINGS Segmentation: 5 non rib-bearing lumbar type vertebral bodies are present. Alignment: AP alignment is anatomic. Rightward curvature is centered at L3. Vertebrae: Focal tumor enhancement is noted along the superior endplate of L5 on the right measuring 1.7 cm. There is punctate enhancement along the inferior endplate of L3. Remote compression fractures of lumbar spine are most evident at L3-4 and L4-5. The T12 compression fractures noted. Enhancement anteriorly at L1 may reflect metastatic disease versus degenerative change. Multiple enhancing lesions are present within the right iliac bone. Right greater than left sacral lesions are present as well. Scattered hemangiomas are present. Conus medullaris: Extends to the L1 level and appears normal. Paraspinal and other soft tissues: Extensive right para-aortic and renal hilar adenopathy is  present. Right nephrectomy is noted. A soft tissue tumor on the right at the L2 level measures 10 x 6 x 8.4 cm. Multiple other smaller para-aortic nodes are present. Hepatic lesions are suspected. The left kidney demonstrates benign appearing exophytic cysts Disc levels: L1-2: Mild central and bilateral foraminal narrowing is secondary to the a broad-based disc protrusion and facet hypertrophy. L2-3: A broad-based disc protrusion is present. Facet hypertrophy leads to moderate central canal stenosis and moderate foramina foraminal narrowing, left greater than right. L3-4: Moderate subarticular narrowing is worse on the right. Mild foraminal narrowing is present bilaterally. L4-5: A broad-based disc protrusion is present. Facet spurring contributes to mild right foraminal narrowing. Mild subarticular narrowing is evident bilaterally. L5-S1: Facet spurring is present bilaterally. Mild foraminal narrowing is worse on the right. IMPRESSION: 1. Multiple metastatic lesions to the thoracic and lumbar spine as described. 2. Right paraspinous soft tissue mass at the T6-7 level with extension into the T6 and T7 pedicles aunt T6-7 foramen. 3. Retrocrural and para-aortic adenopathy compatible with metastatic disease. 4. 10 cm aortocaval soft tissue mass compatible with metastatic disease. 5. Remote fractures at T12 L3 and L4 do not appear to be pathologic. 6. The largest bone metastases are at L5, T10, and right greater than left sacroiliac joints. Electronically Signed   By: San Morelle M.D.   On: 05/06/2017 18:28   Ct Maxillofacial Wo Contrast  Result Date: 05/04/2017 CLINICAL DATA:  Increased weakness with multiple falls, swelling and bruising to the occipital and right zygomatic arch region EXAM: CT HEAD WITHOUT CONTRAST CT MAXILLOFACIAL WITHOUT CONTRAST TECHNIQUE: Multidetector CT imaging of the head and maxillofacial structures were performed using the standard protocol without intravenous contrast.  Multiplanar CT image reconstructions of the maxillofacial structures were also generated. COMPARISON:  None. FINDINGS: CT HEAD FINDINGS Brain: No acute territorial infarction, hemorrhage or intracranial mass is seen. Old encephalomalacia involving the right parietal and temporal lobes consistent with old infarct. Moderate periventricular and subcortical white matter small vessel ischemic changes. Moderate atrophy. Prominent ventricles are felt secondary to atrophy. No midline shift. Vascular: No hyperdense vessels.  Carotid artery calcifications. Skull: No fracture or suspicious bone lesion. Prominent arachnoid granulation in the sub occipital bone on  the right. Other: Partially visualize mass surrounding the lateral aspect of the right maxillary sinus and the anterior aspect of the zygoma. CT MAXILLOFACIAL FINDINGS Osseous: Mandibular heads are normally position. No mandibular fracture. Zygomatic arches and pterygoid plates are intact. No acute nasal bone fracture. Mild periosteal change involving the anterior zygoma and lateral wall of the right maxillary sinus. Orbits: No orbital wall fracture. No intra or extraconal soft tissue abnormality Sinuses: No acute fluid levels. Mild mucosal thickening in the maxillary and ethmoid sinuses. No sinus wall fracture. Soft tissues: Small metallic densities in the right and left nasal superficial soft tissues. Soft tissue mass centered at the junction of the right maxillary sinus and anterior zygoma, this measures 3.2 by 1.8 cm. IMPRESSION: 1. No CT evidence for acute intracranial abnormality. 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 Moderate white matter small vessel ischemic changes 2. No acute facial bone fracture. 3. 3.2 cm mass surrounding the junction of the right anterolateral maxillary sinus and the anterior aspect of the zygomatic arch with underlying mild periostitis. Findings could relate to metastatic lesion or primary bone tumor. Further evaluation  with MRI is recommended. 4. Superficial metallic densities in the right and left nasal soft tissues for which clinical correlation is recommended Electronically Signed   By: Donavan Foil M.D.   On: 05/04/2017 21:29    Assessment and Plan:   1. Urothelial carcinoma, initially diagnosed in 2015 with noninvasive urothelial carcinoma the bladder, status post resection followed by BCG therapy  Multiple recurrences of noninvasive bladder carcinoma, last underwent resection in 2016  Right renal pelvis tumor December 2017, biopsy January 2018 confirmed high-grade urothelial carcinoma  Right nephroureterectomy March 2018 confirmed a high-grade invasive urothelial carcinoma of the right kidney, T4, with lymphovascular invasion  Gross hematuria May 2018-status post removal of retained clot and resection of urothelial carcinoma at the prostate  CT head 05/04/2017 confirmed a mass at the junction of the right maxillary sinus and inferior aspect of the zygomatic arch  MRI thoracic/lumbar spine 05/06/2017 confirmed multiple metastatic lesions, a right paraspinous soft tissue mass at T6-T7, retrocrural and periaortic adenopathy, 10 cm aortocaval mass  2. Back/chest pain-likely secondary to bone metastases  3. Right leg weakness-potentially secondary to nerve root compression for metastatic urothelial carcinoma, though no cord compression is noted on the MRI 05/06/2017  4.  Congestive heart failure  5.  Anemia  6.  Cirrhosis   [name] has a history of urothelial carcinoma. He was diagnosed with invasive urothelial carcinoma of the right kidney earlier this year and underwent a right nephroureterectomy. He presents with pain in multiple sites and right leg weakness. An MRI of the thoracic and lumbar spine confirms multiple areas of metastatic disease. The etiology of the right leg weakness is unclear, though I suspect this is related to peripheral nerve compression from the metastatic tumor.  I  discussed the probable diagnosis of metastatic urothelial carcinoma with [name] and his son. I recommend a diagnostic biopsy. We will consider systemic treatment options if a diagnosis of metastatic urothelial carcinoma is confirmed.  Recommendations: 1. Consult interventional radiology to consider biopsy of a metastatic lymph node or bone lesion 2. Trial of Decadron for the right leg weakness 3. Physical therapy evaluation 4. Narcotic analgesics as needed for pain  I will continue following [name] in the hospital. Outpatient follow-up will be scheduled at the [facility].   Donneta Romberg, MD 05/07/2017, 8:50 AM

## 2017-05-07 NOTE — Social Work (Addendum)
CSW consulted on case.  Passr obtained [id number]. FL2 started as patient has not completed PT evaluation yet. Will complete FL2 once PT has evaluated patient.   Biopsy scheduled for 6/11.

## 2017-05-07 NOTE — Progress Notes (Signed)
   05/07/17 1700  Clinical Encounter Type  Visited With Patient and family together  Visit Type Initial;Psychological support;Spiritual support;Social support  Referral From Nurse  Spiritual Encounters  Spiritual Needs Prayer  Stress Factors  Patient Stress Factors Loss of control  Family Stress Factors Loss of control   Chaplain visited in order to respond to a prayer requests consult.  Doristine Bosworth of the Pt visited earlier today. Pt reported to have just received to news of his cancer. Pt's wife was at bedside. Pt requested prayer for   . Healing  . Restoration  . Support   Pt and wife aren't able to identify their feelings or emotions about the recent news. Chaplain provided prayer and a silent /supportive presence. Wife reported to be actively grieving her father's death (from earlier this year). Both Pt and wife reported to find comfort in their faith at this time.   When Chaplain was heading out of the room Pt asked for a chaplain to visit again.   Chaplain Bea

## 2017-05-07 NOTE — Clinical Social Work Note (Signed)
Clinical Social Work Assessment  Patient Details  Name: Victor Wang MRN: [id number] Date of Birth: 04/03/1935  Date of referral:  05/07/17               Reason for consult:  Facility Placement                Permission sought to share information with:  Chartered certified accountant granted to share information::  Yes, Verbal Permission Granted  Name::     Victor Wang  Agency::  SNF  Relationship::  son  Contact Information:     Housing/Transportation Living arrangements for the past 2 months:  Single Family Home Source of Information:  Adult Children Patient Interpreter Needed:  None Criminal Activity/Legal Involvement Pertinent to Current Situation/Hospitalization:  No - Comment as needed Significant Relationships:  Adult Children, Spouse Lives with:  Spouse Do you feel safe going back to the place where you live?  No Need for family participation in patient care:  Yes (Comment)  Care giving concerns:  CSW met with patient and son at bedside. Patient resided at home with spouse prior to hospitalization. Son resides in [place] and is here temporarily to provide support. Son is concerned for patient health and feel that a facility would be better for long term care for management of his physical impairments.   Social Worker assessment / plan:  CSW met with patient to discuss medical team's recommendations for DC to SNF when medically stable.  CSW explained her role and SNF options/placement. Family has experience with SNF in the past and would like [facility] and/or [facility] for placement.  FL2 and passr being worked on. Offers to be sent out.  Employment status:  Retired Forensic scientist:  Medicare PT Recommendations:  [place] / Referral to community resources:  [place]  Patient/Family's Response to care:  Psychologist, prison and probation services of CSW assistance with placement in facility. No  issues or concerns reported at this time.  Patient/Family's Understanding of and Emotional Response to Diagnosis, Current Treatment, and Prognosis:  Patient/family has good understanding of diagnosis, current treatment and prognosis and are hopeful rehabilitation will assist with impairiment. No issues or concerns at this time.  Emotional Assessment Appearance:  Appears stated age Attitude/Demeanor/Rapport:   (Cooperative, Lethargic/Resting) Affect (typically observed):  Appropriate Orientation:  Oriented to Self, Oriented to Place, Oriented to  Time, Oriented to Situation Alcohol / Substance use:  Not Applicable Psych involvement (Current and /or in the community):  No (Comment)  Discharge Needs  Concerns to be addressed:  Care Coordination Readmission within the last 30 days:  Yes Current discharge risk:  Physical Impairment, Dependent with Mobility Barriers to Discharge:  No Barriers Identified   Victor Baxter, LCSW 05/07/2017, 2:05 PM

## 2017-05-07 NOTE — Progress Notes (Signed)
PROGRESS NOTE    Victor Wang  JJK:[id number] DOB: 08/09/1935 DOA: 05/04/2017 PCP: Townsend Roger, MD   Brief Narrative:  Victor Wang is a 82 y.o. male with medical history significant for chronic CHF and recurrent urothelial carcinoma status post right nephroureterectomy in March 2018, now presenting to the emergency department with increasing weakness and falls, progressive lower extremity edema, and shortness of breath. Patient has been noted to grow increasingly weak, generally, over the past couple weeks, but much more acutely since 04/29/2017. He has had multiple falls since that time, but none with head injury or loss of consciousness. He reports weakness involving all of his extremities, but with legs more so than arms, and perhaps the right leg more so than the left. He denies any numbness, denies headache, and denies change in vision or hearing. His Lasix had been held for the last several days due to increasing weakness, but he has since developed progressive lower extremity edema and shortness of breath. Also concerning is an enlarging nodule under the right eye, first noted a couple weeks ago and growing. Patient reports that it is tender to palpation, but otherwise does not bother him.     Noncontrast head CT was negative for acute intracranial abnormality, but maxillofacial CT demonstrates a 3.2 cm mass at the junction of the right anterolateral maxillary sinus and anterior aspect of the zygomatic arch with mild periostitis and concerning for possible primary bone tumor or a metastasis. Neurology was consulted by the ED physician and has evaluated the patient in the emergency department. Patient remained hemodynamically stable and has not been in acute respiratory distress. He was  admitted to the telemetry unit for ongoing evaluation and management of acute on chronic CHF and generalized weakness, with increased weakness involving the right leg in the presence of new worsening  back pain concerning for metastatic disease. CT Maxillofacial also showed small metallic densities in the Right and left Superficial Soft tissues. He underwent Eye X-Ray which revealed that he had Metallic foci imbedded in the skin of the nose, medial right orbit, and central left orbit. Patient states that they have been there for a while now. MRI of the Thoracic and Lumbar Spine was ordered and could not be done at [facility] as patient was too big to fit in MRI machine so will be transferred to [facility] to have it done and brought back. Patient also underwent and ECHOCardiogram 05/05/17. Patient complained of Constipation 05/06/17 so was given a bisacodyl suppository and Increased Bowel Regimen. MRI was done today at [facility] and shows metastatic lesions to the thoracic and lumbar spine. Consulted Oncology Dr. Benay Spice and Interventional Radiology for a potential Biopsy in AM.   Assessment & Plan:   Principal Problem:   Right leg weakness Active Problems:   Bladder cancer ([place])   Acute CHF ([place])   Essential hypertension   Cancer of renal pelvis, right ([facility])   Facial mass   Leg weakness   Pressure injury of skin   Iron deficiency anemia   GERD (gastroesophageal reflux disease)   Hyperlipidemia   Hypercalcemia  Generalized Weakness and Deconditioning 2/2 to Metastaic Disease - Pt presents with generalized weakness, progressing in recent weeks, most notable in bilateral legs and leading to falls  - Head CT is negative for acute intracranial abnormality - Neurology has evaluated the pt in ED and their consultation much appreciated;  - MRI lumbar and thoracic spine advised for suspected metastatic disease; Concern as Alk  Phos was also elevated - MRI T- and L-spine showed multiple metastatic lesions to the thoracic and lumbar spine as well as a right Paraspinous soft tissue mass at the T6-7 Level with extension into the T6 and T7 Pedicles and T6-7 Foramen. There were also retrocrural and  paraaortic adenopathy compatible with metastatic disease as well as a a 10 cm aortocaval soft tissue mass compatible with metastatic disease. MRI also noted remote fractures at T12, L3, and L4 and the largest Bone metastases are at L5, T10, and the Right Greater than Left sacroiliac joints.  - PT to Evaluate however fatigue caused patient to limit his ablilty to participate  - Pain control with Fentanyl 25-50 mcg IV q2hprn and C/w Norco 1 tab po q4hprn  - Discussed with Dr. Benay Spice of Oncology and feels as if this is metastatic urothelial carcinoma and recommended IR to consider biopsy of a metastatic lymph node or bone lesion as well as Trial of Decadron 10 mg IV q12h for Right Leg weakness as it may be related to peripheral nerve compression from the metastatic tumor.  -IR Consulted and planning on biopsying Paraspinal Mass at T6; Patient will be made NPO at midnight and Lovenox will be held  Acute on Chronic Diastolic CHF  - Pt presents with progressive BLE edema and DOE, found to have crackles, elevated BNP, interstitial edema on CXR  - No ECHO was on file  - BNP was 113.6 - He is managed at home with Lasix 40 mg BID and metoprolol  - Plan to SLIV, follow daily wts and strict I/O's,  -Continue metoprolol as tolerated, and diurese with Lasix 40 mg IV q12h  - Echocardiogram ordered and showed that it was difficult to seen endocardium but wall thickness was increased in a pattern of severe LVH but normal Systolic Fxc and an estimated EF of 50-55% with G1DD.  -Patient is - 3.725 L; Patient's Weight is down 3 lbs  Facial mass  - Pt presents with firm nodule inferior to right eye, growing over the past 2 wks  - CT maxillofacial suggests a primary bone tumor vs metastatic disease  - MRI done and it is likely metastatic disease given findings on L and T spine MRI  Hypertension  - BP is at goal and actually on the lower side 104/57 - Continue Metoprolol 50 mg po Daily as tolerated    Urothelial carcinoma which was recurrent and Renal Cancer - Status-post right nephroureterectomy in March '18, underwent TURBT in early May '18  - Follows with Dr. Tresa Moore of Urology  - Patient has metastatic disease  as above    Iron-deficiency Anemia  - Hgb is stable at 9.1 on admission and no bleeding is evident; -Hb/Hct went from 9.0/28.4 -> 8.7/27.4 - Continue iron-supplementation with Ferrous Sulfate 325 mg po BID -Repeat CBC in AM  GERD -C/w Pantoprazole 40 mg po Daily  Hyperlipidemia -C/w Pravastatin 80 mg po Daily  Hypomagnesemia -Patient's Mag Level was 1.6 -Replete with IV Mag Sulfate 2 grams -Continue to Monitor and Replete Mag as Necessary -Repeat Mag Level in AM  Mild Hypercalcemia -Was mildly elevated and went from 10.4 -> 10.2 -> 10.0 -? Related to Bone Malignancy  -Repeat CMP in AM   Pressure Ulcer on Sacrum  -Mild and poA -WOC Nurse evaluated and left Recc's and appreciate them -Per [place] nurse turn and reposition patient from side to side-avoiding the supine position.  Additionally, we will provide Pressure Redistribution heel boots to prevent pressure injury  while in bed.  A pressure redistribution chair cushion is ordered for use while OOB in chair and post discharge.  Constipation, improved -Scheduled Miralax BID and added Senna Docusate 1 tab po BID -Gave Bisacodyl Suppository 10 mg once.  -Continue to Monitor   DVT prophylaxis: Enoxaparin 40 mg sq q24h (Held for Biopsy in AM) Code Status: FULL CODE Family Communication: Discussed with Son at Bedside Disposition Plan: Remain Inpatient as Work Up is in Progress; PT to Evaluate   Consultants:   Neurology Dr. Leonel Ramsay  Oncology Dr. Benay Spice  Interventional Radiology   Procedures:  ECHOCARDIOGRAM Study Conclusions  - Procedure narrative: Transthoracic echocardiography. Image   quality was suboptimal. The study was technically difficult, as a   result of poor acoustic windows, poor  sound wave transmission,   restricted patient mobility, and body habitus. Intravenous   contrast (Definity) was administered. - Left ventricle: Ver poor image quality even with definity   Difficult to see endocardium Consider cardiac MRI if clinically   indicated to more acurately asses EF. Wall thickness was   increased in a pattern of severe LVH. Systolic function was   normal. The estimated ejection fraction was in the range of 50%   to 55%. Doppler parameters are consistent with abnormal left   ventricular relaxation (grade 1 diastolic dysfunction). - Atrial septum: No defect or patent foramen ovale was identified.  Antimicrobials:  Anti-infectives    None     Subjective: Seen and examined and stated he felt as good as he could after hearing the news of having metastatic disease. Still felt weak. Denied any CP or SOB. No other concerns or complaints at this time.   Objective: Vitals:   05/07/17 1049 05/07/17 1500 05/07/17 1531 05/07/17 1600  BP:  (!) 104/57    Pulse:  88    Resp:  20    Temp:  97.9 F (36.6 C)    TempSrc:  Oral    SpO2: 90% (!) 87% (!) 87% 92%  Weight:      Height:        Intake/Output Summary (Last 24 hours) at 05/07/17 1641 Last data filed at 05/07/17 1400  Gross per 24 hour  Intake              770 ml  Output             1925 ml  Net            -1155 ml   Filed Weights   05/06/17 0420 05/06/17 1003 05/07/17 0510  Weight: 128.6 kg (283 lb 8.2 oz) 128.6 kg (283 lb 8.2 oz) 128.8 kg (283 lb 15.2 oz)   Examination: Physical Exam:  Constitutional: 82 yo Caucasian obese male in NAD appears somewhat discouraged after finding out about metastatic cancer Eyes: Right eye has facial lesion under it. Lids normal ENMT: Grossly normal hearing. External ears and nose appear normal. Neck: Supple with No JVD.  Respiratory: Diminished with no wheezing/rales/rhonchi. Patient was not tachypenic or using any accessory muscles to breathe Cardiovascular: RRR; S1  S2. Trace to 1+ Lower Extremity Edema Abdomen: Soft, mildly tender, Distended due to body habitus.  GU: Deferred. Foley catheter in place. Musculoskeletal: Had some Right leg weakness. No cyanosis or contractures Skin: Warm and dry. Has facial lesion under right eye Neurologic: CN 2-12 grossly intact. No focal deficits appreciated on neurological exam Psychiatric: Depressed Mood and Flat affect. Awake and Alert. Intact judgment and insight.  Data Reviewed: I have personally reviewed  following labs and imaging studies  CBC:  Recent Labs Lab 05/04/17 1859 05/06/17 0533 05/07/17 0507  WBC 7.2 8.4 9.9  NEUTROABS 6.0 6.6 8.3*  HGB 9.1* 9.0* 8.7*  HCT 28.8* 28.4* 27.4*  MCV 93.2 92.8 91.6  PLT 197 196 326   Basic Metabolic Panel:  Recent Labs Lab 05/04/17 1859 05/05/17 0530 05/06/17 0533 05/07/17 0507  NA 135 137 135 135  K 4.2 4.0 3.8 3.9  CL 98* 99* 95* 94*  CO2 29 29 31 29   GLUCOSE 124* 114* 112* 95  BUN 23* 23* 27* 30*  CREATININE 0.98 0.98 1.07 1.08  CALCIUM 10.3 10.4* 10.2 10.0  MG  --   --  1.7 1.6*  PHOS  --   --  3.8 3.5   GFR: Estimated Creatinine Clearance: 77 mL/min (by C-G formula based on SCr of 1.08 mg/dL). Liver Function Tests:  Recent Labs Lab 05/04/17 1859 05/06/17 0533 05/07/17 0507  AST 39 38 34  ALT 36 32 29  ALKPHOS 359* 366* 326*  BILITOT 1.1 1.0 1.2  PROT 6.4* 6.1* 5.9*  ALBUMIN 2.8* 2.6* 2.5*   No results for input(s): LIPASE, AMYLASE in the last 168 hours. No results for input(s): AMMONIA in the last 168 hours. Coagulation Profile:  Recent Labs Lab 05/04/17 1859  INR 1.29   Cardiac Enzymes:  Recent Labs Lab 05/04/17 1859  TROPONINI <0.03   BNP (last 3 results) No results for input(s): PROBNP in the last 8760 hours. HbA1C: No results for input(s): HGBA1C in the last 72 hours. CBG: No results for input(s): GLUCAP in the last 168 hours. Lipid Profile: No results for input(s): CHOL, HDL, LDLCALC, TRIG, CHOLHDL,  LDLDIRECT in the last 72 hours. Thyroid Function Tests: No results for input(s): TSH, T4TOTAL, FREET4, T3FREE, THYROIDAB in the last 72 hours. Anemia Panel: No results for input(s): VITAMINB12, FOLATE, FERRITIN, TIBC, IRON, RETICCTPCT in the last 72 hours. Sepsis Labs: No results for input(s): PROCALCITON, LATICACIDVEN in the last 168 hours.  No results found for this or any previous visit (from the past 240 hour(s)).   Radiology Studies: Mr Thoracic Spine W Wo Contrast  Result Date: 05/06/2017 CLINICAL DATA:  Generalized weakness with progression over the last several weeks. Personal history bladder cancer. Known metastatic disease. EXAM: MRI THORACIC AND LUMBAR SPINE WITHOUT AND WITH CONTRAST TECHNIQUE: Multiplanar and multiecho pulse sequences of the thoracic and lumbar spine were obtained without and with intravenous contrast. CONTRAST:  74m MULTIHANCE GADOBENATE DIMEGLUMINE 529 MG/ML IV SOLN COMPARISON:  CT of the abdomen and pelvis 01/20/2017. FINDINGS: MRI THORACIC SPINE FINDINGS Alignment: AP alignment is anatomic. Exaggerated kyphosis is present in the lower thoracic spine associated with compression fractures. Rightward curvature of the thoracic spine is centered at T6. Vertebrae: There is diffuse decreased T1 marrow signal. Vertebral body heights are maintained through T11. Enhancing lesion along the superior endplate of TZ12measures 13 mm. A 7 mm central enhancing lesion is present at T11. Remote T11 and T12 compression fractures are present without definite enhancement. Tumor is not excluded. Cord:  Normal signal is present throughout the thoracic spinal cord. Paraspinal and other soft tissues: A right paraspinal soft tissue mass is T6-7 measures 5.5 x 5.4 x 2.8 cm. This infiltrates the neural foramina at T6-7 and to lesser stent T6-8. An enlarged retrocrural lymph node on the right measures 10 x 18 mm. Disc levels: No significant disc disease is present. Right foraminal narrowing is  present at C6-7 due to tumor infiltration. There is  some narrowing at T7-8 is well. Osseous foraminal narrowing is secondary to remote compression fractures and facet hypertrophy at T11-12 on the left. MRI LUMBAR SPINE FINDINGS Segmentation: 5 non rib-bearing lumbar type vertebral bodies are present. Alignment: AP alignment is anatomic. Rightward curvature is centered at L3. Vertebrae: Focal tumor enhancement is noted along the superior endplate of L5 on the right measuring 1.7 cm. There is punctate enhancement along the inferior endplate of L3. Remote compression fractures of lumbar spine are most evident at L3-4 and L4-5. The T12 compression fractures noted. Enhancement anteriorly at L1 may reflect metastatic disease versus degenerative change. Multiple enhancing lesions are present within the right iliac bone. Right greater than left sacral lesions are present as well. Scattered hemangiomas are present. Conus medullaris: Extends to the L1 level and appears normal. Paraspinal and other soft tissues: Extensive right para-aortic and renal hilar adenopathy is present. Right nephrectomy is noted. A soft tissue tumor on the right at the L2 level measures 10 x 6 x 8.4 cm. Multiple other smaller para-aortic nodes are present. Hepatic lesions are suspected. The left kidney demonstrates benign appearing exophytic cysts Disc levels: L1-2: Mild central and bilateral foraminal narrowing is secondary to the a broad-based disc protrusion and facet hypertrophy. L2-3: A broad-based disc protrusion is present. Facet hypertrophy leads to moderate central canal stenosis and moderate foramina foraminal narrowing, left greater than right. L3-4: Moderate subarticular narrowing is worse on the right. Mild foraminal narrowing is present bilaterally. L4-5: A broad-based disc protrusion is present. Facet spurring contributes to mild right foraminal narrowing. Mild subarticular narrowing is evident bilaterally. L5-S1: Facet spurring is  present bilaterally. Mild foraminal narrowing is worse on the right. IMPRESSION: 1. Multiple metastatic lesions to the thoracic and lumbar spine as described. 2. Right paraspinous soft tissue mass at the T6-7 level with extension into the T6 and T7 pedicles aunt T6-7 foramen. 3. Retrocrural and para-aortic adenopathy compatible with metastatic disease. 4. 10 cm aortocaval soft tissue mass compatible with metastatic disease. 5. Remote fractures at T12 L3 and L4 do not appear to be pathologic. 6. The largest bone metastases are at L5, T10, and right greater than left sacroiliac joints. Electronically Signed   By: San Morelle M.D.   On: 05/06/2017 18:28   Mr Lumbar Spine W Wo Contrast  Result Date: 05/06/2017 CLINICAL DATA:  Generalized weakness with progression over the last several weeks. Personal history bladder cancer. Known metastatic disease. EXAM: MRI THORACIC AND LUMBAR SPINE WITHOUT AND WITH CONTRAST TECHNIQUE: Multiplanar and multiecho pulse sequences of the thoracic and lumbar spine were obtained without and with intravenous contrast. CONTRAST:  6m MULTIHANCE GADOBENATE DIMEGLUMINE 529 MG/ML IV SOLN COMPARISON:  CT of the abdomen and pelvis 01/20/2017. FINDINGS: MRI THORACIC SPINE FINDINGS Alignment: AP alignment is anatomic. Exaggerated kyphosis is present in the lower thoracic spine associated with compression fractures. Rightward curvature of the thoracic spine is centered at T6. Vertebrae: There is diffuse decreased T1 marrow signal. Vertebral body heights are maintained through T11. Enhancing lesion along the superior endplate of TM35measures 13 mm. A 7 mm central enhancing lesion is present at T11. Remote T11 and T12 compression fractures are present without definite enhancement. Tumor is not excluded. Cord:  Normal signal is present throughout the thoracic spinal cord. Paraspinal and other soft tissues: A right paraspinal soft tissue mass is T6-7 measures 5.5 x 5.4 x 2.8 cm. This  infiltrates the neural foramina at T6-7 and to lesser stent T6-8. An enlarged retrocrural lymph node on  the right measures 10 x 18 mm. Disc levels: No significant disc disease is present. Right foraminal narrowing is present at C6-7 due to tumor infiltration. There is some narrowing at T7-8 is well. Osseous foraminal narrowing is secondary to remote compression fractures and facet hypertrophy at T11-12 on the left. MRI LUMBAR SPINE FINDINGS Segmentation: 5 non rib-bearing lumbar type vertebral bodies are present. Alignment: AP alignment is anatomic. Rightward curvature is centered at L3. Vertebrae: Focal tumor enhancement is noted along the superior endplate of L5 on the right measuring 1.7 cm. There is punctate enhancement along the inferior endplate of L3. Remote compression fractures of lumbar spine are most evident at L3-4 and L4-5. The T12 compression fractures noted. Enhancement anteriorly at L1 may reflect metastatic disease versus degenerative change. Multiple enhancing lesions are present within the right iliac bone. Right greater than left sacral lesions are present as well. Scattered hemangiomas are present. Conus medullaris: Extends to the L1 level and appears normal. Paraspinal and other soft tissues: Extensive right para-aortic and renal hilar adenopathy is present. Right nephrectomy is noted. A soft tissue tumor on the right at the L2 level measures 10 x 6 x 8.4 cm. Multiple other smaller para-aortic nodes are present. Hepatic lesions are suspected. The left kidney demonstrates benign appearing exophytic cysts Disc levels: L1-2: Mild central and bilateral foraminal narrowing is secondary to the a broad-based disc protrusion and facet hypertrophy. L2-3: A broad-based disc protrusion is present. Facet hypertrophy leads to moderate central canal stenosis and moderate foramina foraminal narrowing, left greater than right. L3-4: Moderate subarticular narrowing is worse on the right. Mild foraminal  narrowing is present bilaterally. L4-5: A broad-based disc protrusion is present. Facet spurring contributes to mild right foraminal narrowing. Mild subarticular narrowing is evident bilaterally. L5-S1: Facet spurring is present bilaterally. Mild foraminal narrowing is worse on the right. IMPRESSION: 1. Multiple metastatic lesions to the thoracic and lumbar spine as described. 2. Right paraspinous soft tissue mass at the T6-7 level with extension into the T6 and T7 pedicles aunt T6-7 foramen. 3. Retrocrural and para-aortic adenopathy compatible with metastatic disease. 4. 10 cm aortocaval soft tissue mass compatible with metastatic disease. 5. Remote fractures at T12 L3 and L4 do not appear to be pathologic. 6. The largest bone metastases are at L5, T10, and right greater than left sacroiliac joints. Electronically Signed   By: San Morelle M.D.   On: 05/06/2017 18:28   Scheduled Meds: . allopurinol  100 mg Oral Daily  . calcium-vitamin D  1 tablet Oral Q breakfast  . chlorhexidine  15 mL Mouth Rinse BID  . dexamethasone  10 mg Intravenous Q12H  . dextromethorphan-guaiFENesin  1 tablet Oral BID  . [START ON 05/08/2017] enoxaparin (LOVENOX) injection  40 mg Subcutaneous Q24H  . ferrous sulfate  325 mg Oral BID WC  . furosemide  40 mg Intravenous Q12H  . ipratropium  0.5 mg Nebulization TID  . levalbuterol  0.63 mg Nebulization TID  . mouth rinse  15 mL Mouth Rinse q12n4p  . metoprolol tartrate  50 mg Oral Daily  . pantoprazole  40 mg Oral BID  . polyethylene glycol  17 g Oral BID  . pravastatin  80 mg Oral q1800  . senna-docusate  1 tablet Oral BID  . sodium chloride flush  3 mL Intravenous Q12H   Continuous Infusions: . sodium chloride      LOS: 3 days   Kerney Elbe, DO [facility] Pager [phone]  If 7PM-7AM, please contact  night-coverage [email or web address] Password [facility] 05/07/2017, 4:41 PM

## 2017-05-07 NOTE — Progress Notes (Signed)
PT Cancellation Note  Patient Details Name: Victor Wang MRN: [id number] DOB: Dec 29, 1934   Cancelled Treatment:    Reason Eval/Treat Not Completed: Medical issues which prohibited therapy (has metastases  spine. Planned biopsy 6/11. Will check back after precedure . Will need MD clarification for safety with mobility/sitting given new diagnoses with spine involvement .)   [name] 05/07/2017, 11:13 AM Tresa Endo PT [phone]

## 2017-05-07 NOTE — Consult Note (Signed)
Chief Complaint: Patient was seen in consultation today for recurrent urothelial carcinoma  Referring Physician(s):  Dr. Raiford Noble  Supervising Physician: Marybelle Killings  Patient Status: [name] - Out-pt  History of Present Illness: Victor Wang is a 82 y.o. male with past medical history of chronic CHF, recurrent urothelial carcinoma s/p right nephroureterectomy in March 2018 who presented to the ED with weakness and falls.   CT Head 05/04/17 shows: 1. No CT evidence for acute intracranial abnormality. 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 Moderate white matter small vessel ischemic changes 2. No acute facial bone fracture. 3. 3.2 cm mass surrounding the junction of the right anterolateral maxillary sinus and the anterior aspect of the zygomatic arch with underlying mild periostitis. Findings could relate to metastatic lesion or primary bone tumor. Further evaluation with MRI is recommended. 4. Superficial metallic densities in the right and left nasal soft tissues for which clinical correlation is recommended  MR Thoracic and Lumbar Spine 05/07/17 shows: 1. Multiple metastatic lesions to the thoracic and lumbar spine as described. 2. Right paraspinous soft tissue mass at the T6-7 level with extension into the T6 and T7 pedicles aunt T6-7 foramen. 3. Retrocrural and para-aortic adenopathy compatible with metastatic disease. 4. 10 cm aortocaval soft tissue mass compatible with metastatic disease. 5. Remote fractures at T12 L3 and L4 do not appear to be pathologic. 6. The largest bone metastases are at L5, T10, and right greater than left sacroiliac joints.  IR consulted for biopsy in the setting of metastatic disease.  Case reviewed by Dr. Barbie Banner who feels patient is appropriate for procedure.   Past Medical History:  Diagnosis Date  . Anemia   . Arthritis   . Bladder cancer (HCC)    RECURRENT  . BPH (benign prostatic hypertrophy)   . CHF (congestive  heart failure) ([place])   . Clot 2007    kidney biopsy done  . Complication of anesthesia    oxygen level drops when put to sleep , has some bleeding issues after every surgery with cautery required  . COPD (chronic obstructive pulmonary disease) ([place])   . Dyspnea    exertion; can not complete a flight of stairs without stopping to catch his breath   . Full dentures   . GERD (gastroesophageal reflux disease)   . History of blood transfusion last done 11-13-16   total of 7 units   . History of compression fracture of spine    04/2013   T12  . Hyperlipidemia   . Hypertension   . Nephritis 2007   oral chemo and prednisone done  . Pneumonia last 2016   x 2 in 2016  . PONV (postoperative nausea and vomiting)   . Pulmonary hypertension ([place])    mild per 12-15-15 echo [facility]  . Renal mass    right  . Sleep apnea     Past Surgical History:  Procedure Laterality Date  . APPENDECTOMY  1985  . CARPAL TUNNEL RELEASE Right 02-05-2008  . CYSTOSCOPY W/ RETROGRADES Bilateral 10/29/2014   Procedure: CYSTOSCOPY WITH RETROGRADE PYELOGRAM;  Surgeon: Alexis Frock, MD;  Location: [facility];  Service: Urology;  Laterality: Bilateral;  . CYSTOSCOPY W/ RETROGRADES Bilateral 09/10/2015   Procedure: CYSTOSCOPY WITH RETROGRADE PYELOGRAM;  Surgeon: Alexis Frock, MD;  Location: [facility];  Service: Urology;  Laterality: Bilateral;  . CYSTOSCOPY W/ URETERAL STENT PLACEMENT N/A 03/29/2017   Procedure: CYSTOSCOPY WITH CLOT EVACUATION transurethral resection bladder tumor retrograde  pylegram left ureter;  Surgeon: Alexis Frock, MD;  Location: [facility];  Service: Urology;  Laterality: N/A;  . CYSTOSCOPY WITH BIOPSY N/A 05/26/2014   Procedure: CYSTOSCOPY WITH BIOPSY;  Surgeon: Sharyn Creamer, MD;  Location: [facility];  Service: Urology;  Laterality: N/A;  . CYSTOSCOPY WITH RETROGRADE PYELOGRAM, URETEROSCOPY AND STENT PLACEMENT Bilateral 04/09/2014    Procedure: CYSTOSCOPY WITH RETROGRADE PYELOGRAM, POSSIBLE URETEROSCOPY WITH BIOPSY AND STENT PLACEMENT;  Surgeon: Sharyn Creamer, MD;  Location: [facility];  Service: Urology;  Laterality: Bilateral;  . CYSTOSCOPY WITH RETROGRADE PYELOGRAM, URETEROSCOPY AND STENT PLACEMENT Right 02/01/2017   Procedure: CYSTOSCOPY WITH RIGHT  RETROGRADE PYELOGRAM  AND RIGHT STENT PLACEMENT;  Surgeon: Alexis Frock, MD;  Location: [facility];  Service: Urology;  Laterality: Right;  . CYSTOSCOPY/RETROGRADE/URETEROSCOPY Right 12/28/2016   Procedure: CYSTOSCOPY/RETROGRADE/URETEROSCOPY WITH BIOPSY right renal pelvis insertion double j stent;  Surgeon: Alexis Frock, MD;  Location: [facility];  Service: Urology;  Laterality: Right;  . KNEE LIGAMENT RECONSTRUCTION Right 1970  . RIGHT SHOULDER SURGERY  2011  . ROBOT ASSITED LAPAROSCOPIC NEPHROURETERECTOMY Right 02/01/2017   Procedure: XI ROBOT ASSITED LAPAROSCOPIC NEPHROURETERECTOMY;  Surgeon: Alexis Frock, MD;  Location: [facility];  Service: Urology;  Laterality: Right;  . TONSILLECTOMY AND ADENOIDECTOMY  CHILD   and adenoids  . TRANSURETHRAL RESECTION OF BLADDER TUMOR WITH GYRUS (TURBT-GYRUS) Bilateral 04/09/2014   Procedure: TRANSURETHRAL RESECTION OF BLADDER TUMOR WITH GYRUS (TURBT-GYRUS);  Surgeon: Sharyn Creamer, MD;  Location: [facility];  Service: Urology;  Laterality: Bilateral;  . TRANSURETHRAL RESECTION OF BLADDER TUMOR WITH GYRUS (TURBT-GYRUS) N/A 10/29/2014   Procedure: TRANSURETHRAL RESECTION OF BLADDER TUMOR WITH GYRUS (TURBT-GYRUS);  Surgeon: Alexis Frock, MD;  Location: [facility];  Service: Urology;  Laterality: N/A;  . TRANSURETHRAL RESECTION OF BLADDER TUMOR WITH GYRUS (TURBT-GYRUS) N/A 09/10/2015   Procedure: TRANSURETHRAL RESECTION OF BLADDER TUMOR ;  Surgeon: Alexis Frock, MD;  Location: [facility];  Service: Urology;  Laterality: N/A;  . TRANSURETHRAL RESECTION OF PROSTATE  2006   AND     POST CAURTERIZATION OF BLEEDERS  . TRANSURETHRAL RESECTION OF PROSTATE N/A 09/10/2015   Procedure: TRANSURETHRAL RESECTION OF THE PROSTATE ;  Surgeon: Alexis Frock, MD;  Location: [facility];  Service: Urology;  Laterality: N/A;    Allergies: Bee venom; Penicillins; and Tramadol  Medications: Prior to Admission medications   Medication Sig Start Date End Date Taking? Authorizing Provider  acetaminophen (TYLENOL) 650 MG CR tablet Take 1,300 mg by mouth every 8 (eight) hours as needed for pain.   Yes [provider]  allopurinol (ZYLOPRIM) 100 MG tablet Take 100 mg by mouth daily.    Yes [provider]  calcium-vitamin D (OSCAL WITH D) 500-200 MG-UNIT tablet Take 1 tablet by mouth daily with breakfast.   Yes [provider]  ferrous sulfate 325 (65 FE) MG tablet Take 1 tablet (325 mg total) by mouth 3 (three) times daily with meals. Patient taking differently: Take 325 mg by mouth 2 (two) times daily with a meal.  01/04/17  Yes Short, Noah Delaine, MD  furosemide (LASIX) 40 MG tablet Take 40 mg by mouth 2 (two) times daily.   Yes [provider]  metoprolol (LOPRESSOR) 50 MG tablet Take 50 mg by mouth daily.   Yes [provider]  omeprazole (PRILOSEC) 20 MG capsule Take 20 mg by mouth daily.    Yes [provider]  polyethylene glycol (MIRALAX / GLYCOLAX) packet Take  17 g by mouth daily. 01/05/17  Yes Short, Noah Delaine, MD  potassium chloride SA (K-DUR,KLOR-CON) 20 MEQ tablet Take 20 mEq by mouth daily.   Yes [provider]  pravastatin (PRAVACHOL) 80 MG tablet Take 80 mg by mouth daily.    Yes [provider]  HYDROcodone-acetaminophen (NORCO) 5-325 MG tablet Take 1-2 tablets by mouth every 6 (six) hours as needed for moderate pain or severe pain. Patient not taking: Reported on 05/04/2017 02/01/17   Debbrah Alar, PA-C     Family History  Problem Relation Age of Onset  . Congestive Heart Failure Mother   .  Bladder Cancer Neg Hx     Social History   Social History  . Marital status: Married    Spouse name: N/A  . Number of children: N/A  . Years of education: N/A   Social History Main Topics  . Smoking status: Former Smoker    Packs/day: 1.50    Years: 35.00    Types: Cigarettes    Quit date: 11/28/1985  . Smokeless tobacco: Never Used  . Alcohol use 2.4 oz/week    4 Standard drinks or equivalent per week     Comment: seldom  . Drug use: No  . Sexual activity: Not Asked   Other Topics Concern  . None   Social History Narrative  . None    Review of Systems  Constitutional: Negative for fatigue and fever.  Respiratory: Negative for cough and shortness of breath.   Cardiovascular: Negative for chest pain.  Gastrointestinal: Negative for abdominal pain.  Neurological: Positive for weakness.  Psychiatric/Behavioral: Negative for behavioral problems and confusion.    Vital Signs: BP (!) 105/50 (BP Location: Left Arm)   Pulse (!) 114   Temp 99.5 F (37.5 C) (Oral)   Resp 20   Ht 6' (1.829 m)   Wt 283 lb 15.2 oz (128.8 kg)   SpO2 93%   BMI 38.51 kg/m   Physical Exam  Constitutional: He is oriented to person, place, and time. He appears well-developed.  Cardiovascular: Normal rate, regular rhythm and normal heart sounds.   Pulmonary/Chest: Effort normal and breath sounds normal. No respiratory distress.  Neurological: He is alert and oriented to person, place, and time.  Skin: Skin is warm and dry.  Psychiatric: He has a normal mood and affect. His behavior is normal. Judgment and thought content normal.  Nursing note and vitals reviewed.   Mallampati Score:  MD Evaluation Airway: WNL Heart: WNL Abdomen: WNL Chest/ Lungs: WNL ASA  Classification: 3 Mallampati/Airway Score: Two  Imaging: Dg Eye Foreign Body  Result Date: 05/05/2017 CLINICAL DATA:  Three metallic foci are identified. Correlation with a CT scan from yesterday demonstrates a metallic focus in  the skin of the medial right orbit and another in the skin over the left orbit. Again, both appear to be imbedded in the skin. Metallic foci are seen in the nose as well, imbedded in the soft tissues. No other abnormalities. EXAM: ORBITS FOR FOREIGN BODY - 2 VIEW COMPARISON:  None. FINDINGS: Metallic foci imbedded in the skin of the nose, medial right orbit, and central left orbit. I reviewed the maxillofacial CT with a neuroradiologist. It was decided an MRI of the face would not provide enough additional information to justify the MRI of the face. The MRI of the thoracic and lumbar spine can be performed as long as the patient could communicate any pain or discomfort to the MRI technologist. IMPRESSION: Metallic foci imbedded in the  skin of the nose, medial right orbit, and central left orbit should be stable. I reviewed the maxillofacial CT with a neuroradiologist. It was decided an MRI of the face would not provide enough additional information to justify the MRI of the face. The MRI of the thoracic and lumbar spine can be performed in this patient with extremity weakness as long as the patient can communicate any pain or discomfort to the MRI technologist. These findings and recommendations were discussed with the MRI technologist. Electronically Signed   By: Dorise Bullion III M.D   On: 05/05/2017 08:58   Dg Nasal Bones  Result Date: 05/04/2017 CLINICAL DATA:  Question nasal metallic foreign body. EXAM: NASAL BONES - 3+ VIEW COMPARISON:  Face CT earlier this day. FINDINGS: There are 2 metallic foreign bodies projecting over the right and left nasal region, which are in the soft tissues on CT. No nasal bone fracture. IMPRESSION: Tiny metallic densities project over the right and left nasal region, which corresponds soft tissue foreign bodies on CT. Electronically Signed   By: Jeb Levering M.D.   On: 05/04/2017 22:40   Dg Chest 2 View  Result Date: 05/04/2017 CLINICAL DATA:  Dyspnea cough. EXAM: CHEST   2 VIEW COMPARISON:  01/01/2017 FINDINGS: AP and lateral views of the chest show low volumes with cardiomegaly. There is vascular congestion with diffuse interstitial opacity compatible with edema. Right base atelectasis or infiltrate noted. Small right pleural effusion. The visualized bony structures of the thorax are intact. Telemetry leads overlie the chest. IMPRESSION: Cardiomegaly with interstitial pulmonary edema. Right base atelectasis with small right pleural effusion. Electronically Signed   By: Misty Stanley M.D.   On: 05/04/2017 20:41   Ct Head Wo Contrast  Result Date: 05/04/2017 CLINICAL DATA:  Increased weakness with multiple falls, swelling and bruising to the occipital and right zygomatic arch region EXAM: CT HEAD WITHOUT CONTRAST CT MAXILLOFACIAL WITHOUT CONTRAST TECHNIQUE: Multidetector CT imaging of the head and maxillofacial structures were performed using the standard protocol without intravenous contrast. Multiplanar CT image reconstructions of the maxillofacial structures were also generated. COMPARISON:  None. FINDINGS: CT HEAD FINDINGS Brain: No acute territorial infarction, hemorrhage or intracranial mass is seen. Old encephalomalacia involving the right parietal and temporal lobes consistent with old infarct. Moderate periventricular and subcortical white matter small vessel ischemic changes. Moderate atrophy. Prominent ventricles are felt secondary to atrophy. No midline shift. Vascular: No hyperdense vessels.  Carotid artery calcifications. Skull: No fracture or suspicious bone lesion. Prominent arachnoid granulation in the sub occipital bone on the right. Other: Partially visualize mass surrounding the lateral aspect of the right maxillary sinus and the anterior aspect of the zygoma. CT MAXILLOFACIAL FINDINGS Osseous: Mandibular heads are normally position. No mandibular fracture. Zygomatic arches and pterygoid plates are intact. No acute nasal bone fracture. Mild periosteal change  involving the anterior zygoma and lateral wall of the right maxillary sinus. Orbits: No orbital wall fracture. No intra or extraconal soft tissue abnormality Sinuses: No acute fluid levels. Mild mucosal thickening in the maxillary and ethmoid sinuses. No sinus wall fracture. Soft tissues: Small metallic densities in the right and left nasal superficial soft tissues. Soft tissue mass centered at the junction of the right maxillary sinus and anterior zygoma, this measures 3.2 by 1.8 cm. IMPRESSION: 1. No CT evidence for acute intracranial abnormality. 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 Moderate white matter small vessel ischemic changes 2. No acute facial bone fracture. 3. 3.2 cm mass surrounding the  junction of the right anterolateral maxillary sinus and the anterior aspect of the zygomatic arch with underlying mild periostitis. Findings could relate to metastatic lesion or primary bone tumor. Further evaluation with MRI is recommended. 4. Superficial metallic densities in the right and left nasal soft tissues for which clinical correlation is recommended Electronically Signed   By: Donavan Foil M.D.   On: 05/04/2017 21:29   Mr Thoracic Spine W Wo Contrast  Result Date: 05/06/2017 CLINICAL DATA:  Generalized weakness with progression over the last several weeks. Personal history bladder cancer. Known metastatic disease. EXAM: MRI THORACIC AND LUMBAR SPINE WITHOUT AND WITH CONTRAST TECHNIQUE: Multiplanar and multiecho pulse sequences of the thoracic and lumbar spine were obtained without and with intravenous contrast. CONTRAST:  24mL MULTIHANCE GADOBENATE DIMEGLUMINE 529 MG/ML IV SOLN COMPARISON:  CT of the abdomen and pelvis 01/20/2017. FINDINGS: MRI THORACIC SPINE FINDINGS Alignment: AP alignment is anatomic. Exaggerated kyphosis is present in the lower thoracic spine associated with compression fractures. Rightward curvature of the thoracic spine is centered at T6. Vertebrae: There is diffuse  decreased T1 marrow signal. Vertebral body heights are maintained through T11. Enhancing lesion along the superior endplate of Z61 measures 13 mm. A 7 mm central enhancing lesion is present at T11. Remote T11 and T12 compression fractures are present without definite enhancement. Tumor is not excluded. Cord:  Normal signal is present throughout the thoracic spinal cord. Paraspinal and other soft tissues: A right paraspinal soft tissue mass is T6-7 measures 5.5 x 5.4 x 2.8 cm. This infiltrates the neural foramina at T6-7 and to lesser stent T6-8. An enlarged retrocrural lymph node on the right measures 10 x 18 mm. Disc levels: No significant disc disease is present. Right foraminal narrowing is present at C6-7 due to tumor infiltration. There is some narrowing at T7-8 is well. Osseous foraminal narrowing is secondary to remote compression fractures and facet hypertrophy at T11-12 on the left. MRI LUMBAR SPINE FINDINGS Segmentation: 5 non rib-bearing lumbar type vertebral bodies are present. Alignment: AP alignment is anatomic. Rightward curvature is centered at L3. Vertebrae: Focal tumor enhancement is noted along the superior endplate of L5 on the right measuring 1.7 cm. There is punctate enhancement along the inferior endplate of L3. Remote compression fractures of lumbar spine are most evident at L3-4 and L4-5. The T12 compression fractures noted. Enhancement anteriorly at L1 may reflect metastatic disease versus degenerative change. Multiple enhancing lesions are present within the right iliac bone. Right greater than left sacral lesions are present as well. Scattered hemangiomas are present. Conus medullaris: Extends to the L1 level and appears normal. Paraspinal and other soft tissues: Extensive right para-aortic and renal hilar adenopathy is present. Right nephrectomy is noted. A soft tissue tumor on the right at the L2 level measures 10 x 6 x 8.4 cm. Multiple other smaller para-aortic nodes are present.  Hepatic lesions are suspected. The left kidney demonstrates benign appearing exophytic cysts Disc levels: L1-2: Mild central and bilateral foraminal narrowing is secondary to the a broad-based disc protrusion and facet hypertrophy. L2-3: A broad-based disc protrusion is present. Facet hypertrophy leads to moderate central canal stenosis and moderate foramina foraminal narrowing, left greater than right. L3-4: Moderate subarticular narrowing is worse on the right. Mild foraminal narrowing is present bilaterally. L4-5: A broad-based disc protrusion is present. Facet spurring contributes to mild right foraminal narrowing. Mild subarticular narrowing is evident bilaterally. L5-S1: Facet spurring is present bilaterally. Mild foraminal narrowing is worse on the right. IMPRESSION: 1. Multiple  metastatic lesions to the thoracic and lumbar spine as described. 2. Right paraspinous soft tissue mass at the T6-7 level with extension into the T6 and T7 pedicles aunt T6-7 foramen. 3. Retrocrural and para-aortic adenopathy compatible with metastatic disease. 4. 10 cm aortocaval soft tissue mass compatible with metastatic disease. 5. Remote fractures at T12 L3 and L4 do not appear to be pathologic. 6. The largest bone metastases are at L5, T10, and right greater than left sacroiliac joints. Electronically Signed   By: San Morelle M.D.   On: 05/06/2017 18:28   Mr Lumbar Spine W Wo Contrast  Result Date: 05/06/2017 CLINICAL DATA:  Generalized weakness with progression over the last several weeks. Personal history bladder cancer. Known metastatic disease. EXAM: MRI THORACIC AND LUMBAR SPINE WITHOUT AND WITH CONTRAST TECHNIQUE: Multiplanar and multiecho pulse sequences of the thoracic and lumbar spine were obtained without and with intravenous contrast. CONTRAST:  29mL MULTIHANCE GADOBENATE DIMEGLUMINE 529 MG/ML IV SOLN COMPARISON:  CT of the abdomen and pelvis 01/20/2017. FINDINGS: MRI THORACIC SPINE FINDINGS Alignment: AP  alignment is anatomic. Exaggerated kyphosis is present in the lower thoracic spine associated with compression fractures. Rightward curvature of the thoracic spine is centered at T6. Vertebrae: There is diffuse decreased T1 marrow signal. Vertebral body heights are maintained through T11. Enhancing lesion along the superior endplate of S85 measures 13 mm. A 7 mm central enhancing lesion is present at T11. Remote T11 and T12 compression fractures are present without definite enhancement. Tumor is not excluded. Cord:  Normal signal is present throughout the thoracic spinal cord. Paraspinal and other soft tissues: A right paraspinal soft tissue mass is T6-7 measures 5.5 x 5.4 x 2.8 cm. This infiltrates the neural foramina at T6-7 and to lesser stent T6-8. An enlarged retrocrural lymph node on the right measures 10 x 18 mm. Disc levels: No significant disc disease is present. Right foraminal narrowing is present at C6-7 due to tumor infiltration. There is some narrowing at T7-8 is well. Osseous foraminal narrowing is secondary to remote compression fractures and facet hypertrophy at T11-12 on the left. MRI LUMBAR SPINE FINDINGS Segmentation: 5 non rib-bearing lumbar type vertebral bodies are present. Alignment: AP alignment is anatomic. Rightward curvature is centered at L3. Vertebrae: Focal tumor enhancement is noted along the superior endplate of L5 on the right measuring 1.7 cm. There is punctate enhancement along the inferior endplate of L3. Remote compression fractures of lumbar spine are most evident at L3-4 and L4-5. The T12 compression fractures noted. Enhancement anteriorly at L1 may reflect metastatic disease versus degenerative change. Multiple enhancing lesions are present within the right iliac bone. Right greater than left sacral lesions are present as well. Scattered hemangiomas are present. Conus medullaris: Extends to the L1 level and appears normal. Paraspinal and other soft tissues: Extensive right  para-aortic and renal hilar adenopathy is present. Right nephrectomy is noted. A soft tissue tumor on the right at the L2 level measures 10 x 6 x 8.4 cm. Multiple other smaller para-aortic nodes are present. Hepatic lesions are suspected. The left kidney demonstrates benign appearing exophytic cysts Disc levels: L1-2: Mild central and bilateral foraminal narrowing is secondary to the a broad-based disc protrusion and facet hypertrophy. L2-3: A broad-based disc protrusion is present. Facet hypertrophy leads to moderate central canal stenosis and moderate foramina foraminal narrowing, left greater than right. L3-4: Moderate subarticular narrowing is worse on the right. Mild foraminal narrowing is present bilaterally. L4-5: A broad-based disc protrusion is present. Facet spurring contributes  to mild right foraminal narrowing. Mild subarticular narrowing is evident bilaterally. L5-S1: Facet spurring is present bilaterally. Mild foraminal narrowing is worse on the right. IMPRESSION: 1. Multiple metastatic lesions to the thoracic and lumbar spine as described. 2. Right paraspinous soft tissue mass at the T6-7 level with extension into the T6 and T7 pedicles aunt T6-7 foramen. 3. Retrocrural and para-aortic adenopathy compatible with metastatic disease. 4. 10 cm aortocaval soft tissue mass compatible with metastatic disease. 5. Remote fractures at T12 L3 and L4 do not appear to be pathologic. 6. The largest bone metastases are at L5, T10, and right greater than left sacroiliac joints. Electronically Signed   By: San Morelle M.D.   On: 05/06/2017 18:28   Ct Maxillofacial Wo Contrast  Result Date: 05/04/2017 CLINICAL DATA:  Increased weakness with multiple falls, swelling and bruising to the occipital and right zygomatic arch region EXAM: CT HEAD WITHOUT CONTRAST CT MAXILLOFACIAL WITHOUT CONTRAST TECHNIQUE: Multidetector CT imaging of the head and maxillofacial structures were performed using the standard  protocol without intravenous contrast. Multiplanar CT image reconstructions of the maxillofacial structures were also generated. COMPARISON:  None. FINDINGS: CT HEAD FINDINGS Brain: No acute territorial infarction, hemorrhage or intracranial mass is seen. Old encephalomalacia involving the right parietal and temporal lobes consistent with old infarct. Moderate periventricular and subcortical white matter small vessel ischemic changes. Moderate atrophy. Prominent ventricles are felt secondary to atrophy. No midline shift. Vascular: No hyperdense vessels.  Carotid artery calcifications. Skull: No fracture or suspicious bone lesion. Prominent arachnoid granulation in the sub occipital bone on the right. Other: Partially visualize mass surrounding the lateral aspect of the right maxillary sinus and the anterior aspect of the zygoma. CT MAXILLOFACIAL FINDINGS Osseous: Mandibular heads are normally position. No mandibular fracture. Zygomatic arches and pterygoid plates are intact. No acute nasal bone fracture. Mild periosteal change involving the anterior zygoma and lateral wall of the right maxillary sinus. Orbits: No orbital wall fracture. No intra or extraconal soft tissue abnormality Sinuses: No acute fluid levels. Mild mucosal thickening in the maxillary and ethmoid sinuses. No sinus wall fracture. Soft tissues: Small metallic densities in the right and left nasal superficial soft tissues. Soft tissue mass centered at the junction of the right maxillary sinus and anterior zygoma, this measures 3.2 by 1.8 cm. IMPRESSION: 1. No CT evidence for acute intracranial abnormality. 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 Moderate white matter small vessel ischemic changes 2. No acute facial bone fracture. 3. 3.2 cm mass surrounding the junction of the right anterolateral maxillary sinus and the anterior aspect of the zygomatic arch with underlying mild periostitis. Findings could relate to metastatic lesion or  primary bone tumor. Further evaluation with MRI is recommended. 4. Superficial metallic densities in the right and left nasal soft tissues for which clinical correlation is recommended Electronically Signed   By: Donavan Foil M.D.   On: 05/04/2017 21:29    Labs:  CBC:  Recent Labs  03/29/17 0535 05/04/17 1859 05/06/17 0533 05/07/17 0507  WBC 4.6 7.2 8.4 9.9  HGB 9.0* 9.1* 9.0* 8.7*  HCT 28.4* 28.8* 28.4* 27.4*  PLT 153 197 196 208    COAGS:  Recent Labs  11/15/16 1735 01/02/17 1043 05/04/17 1859  INR 1.31 1.35 1.29  APTT  --   --  38*    BMP:  Recent Labs  05/04/17 1859 05/05/17 0530 05/06/17 0533 05/07/17 0507  NA 135 137 135 135  K 4.2 4.0 3.8 3.9  CL  98* 99* 95* 94*  CO2 29 29 31 29   GLUCOSE 124* 114* 112* 95  BUN 23* 23* 27* 30*  CALCIUM 10.3 10.4* 10.2 10.0  CREATININE 0.98 0.98 1.07 1.08  GFRNONAA >60 >60 >60 >60  GFRAA >60 >60 >60 >60    LIVER FUNCTION TESTS:  Recent Labs  01/31/17 1505 05/04/17 1859 05/06/17 0533 05/07/17 0507  BILITOT 0.7 1.1 1.0 1.2  AST 31 39 38 34  ALT 23 36 32 29  ALKPHOS 162* 359* 366* 326*  PROT 6.2* 6.4* 6.1* 5.9*  ALBUMIN 3.0* 2.8* 2.6* 2.5*    TUMOR MARKERS: No results for input(s): AFPTM, CEA, CA199, CHROMGRNA in the last 8760 hours.  Assessment and Plan: Recurrent urothelial carcinoma with history of metastases, now concern for metastasis to the spine. Patient presented to hospital with weakness and falls.  MR thoracic and lumbar spine shows multiple metastatic lesions.  IR consulted for biopsy at the request of Dr. Benay Spice.  Case reviewed by Dr. Barbie Banner who recommends biopsy of paraspinal mass at T6.  Risks and Benefits discussed with the patient including, but not limited to bleeding, infection, damage to adjacent structures or low yield requiring additional tests. All of the patient's questions were answered, patient is agreeable to proceed. Consent signed and in chart. Patient has been made NPO after  midnight.  Lovenox was held through tomorrow evening.   Thank you for this interesting consult.  I greatly enjoyed meeting ALOK MINSHALL and look forward to participating in their care.  A copy of this report was sent to the requesting provider on this date.  Electronically Signed: Docia Barrier, PA 05/07/2017, 9:48 AM   I spent a total of 40 Minutes    in face to face in clinical consultation, greater than 50% of which was counseling/coordinating care for paraspinal metastases

## 2017-05-08 ENCOUNTER — Inpatient Hospital Stay (HOSPITAL_COMMUNITY): Payer: Medicare Other

## 2017-05-08 LAB — COMPREHENSIVE METABOLIC PANEL
ALBUMIN: 2.4 g/dL — AB (ref 3.5–5.0)
ALT: 28 U/L (ref 17–63)
ANION GAP: 9 (ref 5–15)
AST: 37 U/L (ref 15–41)
Alkaline Phosphatase: 320 U/L — ABNORMAL HIGH (ref 38–126)
BUN: 36 mg/dL — AB (ref 6–20)
CO2: 29 mmol/L (ref 22–32)
Calcium: 10.2 mg/dL (ref 8.9–10.3)
Chloride: 94 mmol/L — ABNORMAL LOW (ref 101–111)
Creatinine, Ser: 0.99 mg/dL (ref 0.61–1.24)
GFR calc Af Amer: >60 mL/min (ref >60)
GFR calc non Af Amer: >60 mL/min (ref >60)
GLUCOSE: 205 mg/dL — AB (ref 65–99)
POTASSIUM: 4 mmol/L (ref 3.5–5.1)
SODIUM: 132 mmol/L — AB (ref 135–145)
Total Bilirubin: 0.9 mg/dL (ref 0.3–1.2)
Total Protein: 6.1 g/dL — ABNORMAL LOW (ref 6.5–8.1)

## 2017-05-08 LAB — CBC WITH DIFFERENTIAL/PLATELET
BASOS ABS: 0 10*3/uL (ref 0.0–0.1)
BASOS PCT: 0 %
EOS ABS: 0 10*3/uL (ref 0.0–0.7)
Eosinophils Relative: 0 %
HCT: 27 % — ABNORMAL LOW (ref 39.0–52.0)
Hemoglobin: 8.7 g/dL — ABNORMAL LOW (ref 13.0–17.0)
Lymphocytes Relative: 6 %
Lymphs Abs: 0.2 10*3/uL — ABNORMAL LOW (ref 0.7–4.0)
MCH: 29.6 pg (ref 26.0–34.0)
MCHC: 32.2 g/dL (ref 30.0–36.0)
MCV: 91.8 fL (ref 78.0–100.0)
MONO ABS: 0.2 10*3/uL (ref 0.1–1.0)
MONOS PCT: 4 %
Neutro Abs: 3.9 10*3/uL (ref 1.7–7.7)
Neutrophils Relative %: 90 %
Platelets: 180 10*3/uL (ref 150–400)
RBC: 2.94 MIL/uL — ABNORMAL LOW (ref 4.22–5.81)
RDW: 14.6 % (ref 11.5–15.5)
WBC: 4.3 10*3/uL (ref 4.0–10.5)

## 2017-05-08 LAB — GLUCOSE, CAPILLARY
GLUCOSE-CAPILLARY: 243 mg/dL — AB (ref 65–99)
Glucose-Capillary: 178 mg/dL — ABNORMAL HIGH (ref 65–99)
Glucose-Capillary: 176 mg/dL — ABNORMAL HIGH (ref 65–99)

## 2017-05-08 LAB — PHOSPHORUS: Phosphorus: 3.2 mg/dL (ref 2.5–4.6)

## 2017-05-08 LAB — MAGNESIUM: Magnesium: 1.9 mg/dL (ref 1.7–2.4)

## 2017-05-08 MED ORDER — MIDAZOLAM HCL 2 MG/2ML IJ SOLN
INTRAMUSCULAR | Status: AC | PRN
  Administered 2017-05-08 (×2): 1 mg via INTRAVENOUS

## 2017-05-08 MED ORDER — MIDAZOLAM HCL 2 MG/2ML IJ SOLN
INTRAMUSCULAR | Status: AC
  Filled 2017-05-08: qty 6

## 2017-05-08 MED ORDER — INSULIN ASPART 100 UNIT/ML ~~LOC~~ SOLN: 0.0000 [IU] | Freq: Every day | SUBCUTANEOUS | Status: DC

## 2017-05-08 MED ORDER — FENTANYL CITRATE (PF) 100 MCG/2ML IJ SOLN
INTRAMUSCULAR | Status: AC | PRN
  Administered 2017-05-08 (×2): 50 ug via INTRAVENOUS

## 2017-05-08 MED ORDER — INSULIN ASPART 100 UNIT/ML ~~LOC~~ SOLN
0.0000 [IU] | Freq: Three times a day (TID) | SUBCUTANEOUS | Status: DC
  Administered 2017-05-08: 2 [IU] via SUBCUTANEOUS
  Administered 2017-05-08: 3 [IU] via SUBCUTANEOUS
  Administered 2017-05-09: 1 [IU] via SUBCUTANEOUS
  Administered 2017-05-09 – 2017-05-10 (×2): 2 [IU] via SUBCUTANEOUS

## 2017-05-08 MED ORDER — FENTANYL CITRATE (PF) 100 MCG/2ML IJ SOLN
INTRAMUSCULAR | Status: AC
  Filled 2017-05-08: qty 4

## 2017-05-08 NOTE — Progress Notes (Signed)
PT Cancellation Note  Patient Details Name: Victor Wang MRN: [id number] DOB: 1935-08-03   Cancelled Treatment:    Reason Eval/Treat Not Completed: Medical issues which prohibited therapy;Patient at procedure or test/unavailable (pt off floor for biopsy. Awaiting guidance regarding mobility from MD (see previous PT note). ) Will follow.     Blondell Reveal Kistler 05/08/2017, 11:11 AM [phone]

## 2017-05-08 NOTE — Progress Notes (Signed)
MRI Thoracic and Lumbar Spine: : 1. Multiple metastatic lesions to the thoracic and lumbar spine. 2. Right paraspinous soft tissue mass at the T6-7 level with extension into the T6 and T7 pedicles and T6-7 foramen. 3. Retrocrural and para-aortic adenopathy compatible with metastatic disease. 4. 10 cm aortocaval soft tissue mass compatible with metastatic disease. 5. Remote fractures at T12 L3 and L4 do not appear to be pathologic. 6. The largest bone metastases are at L5, T10, and right greater than left sacroiliac joints.  Assessment/Recommendations:  82 year old male with history of recurrent urothelial carcinoma, presenting to the ED with increasing weakness and falls, progressive lower extremity edema and SOB. Spine imaging reveals multiple metastatic lesions within the thoracic and lumbar spine.  1. MRI of thoracic and lumbar spine reveals multiple bony metastates, the largest located at L5, T10, and the right greater than left sacroiliac joints. 2. Also noted on MRI is a right paraspinous soft tissue mass at the T6-7 level with extension into the T6 and T7 pedicles and T6-7 foramen. This could result in a right T6 radiculopathy.  3. Based upon new diagnosis of multiple metastases, falls most likely were multifactorial, with generalized weakness and pain most likely significant contributing factors. No thoracic spinal cord lesion and no conus medullaris or cauda equina compression seen on imaging to explain his falls. Upper extremities were with normal strength on initial neurological exam, therefore MRI C-spine would be of low yield regarding possible spinal cord pathology, but may be of use to further define the extent of the Victor Wang bony metastases.  4. Oncology and Interventional Radiology have been consulted.  5. Neurology service to sign off for now. Please call if there are additional questions.   Electronically signed: Dr. Kerney Elbe

## 2017-05-08 NOTE — Sedation Documentation (Signed)
Patient is resting comfortably. 

## 2017-05-08 NOTE — Progress Notes (Signed)
IP PROGRESS NOTE  Subjective:   He reports no pain at rest. He has back pain with movement.  Objective: Vital signs in last 24 hours: Blood pressure (!) 121/59, pulse 91, temperature 97.8 F (36.6 C), temperature source Oral, resp. rate 20, height 6' (1.829 m), weight 282 lb 3 oz (128 kg), SpO2 94 %.  Intake/Output from previous day: 06/10 0701 - 06/11 0700 In: 1247 [P.O.:1197; IV Piggyback:50] Out: 5885 [Urine:1750]  Physical Exam:  HEENT: No thrush Abdomen: Mild tenderness at the right costal margin Extremities: No leg edema Neurologic: Mild decreased strength with flexion at the right hip   Lab Results:  Recent Labs  05/07/17 0507 05/08/17 0507  WBC 9.9 4.3  HGB 8.7* 8.7*  HCT 27.4* 27.0*  PLT 208 180    BMET  Recent Labs  05/07/17 0507 05/08/17 0507  NA 135 132*  K 3.9 4.0  CL 94* 94*  CO2 29 29  GLUCOSE 95 205*  BUN 30* 36*  CREATININE 1.08 0.99  CALCIUM 10.0 10.2    Studies/Results: Mr Thoracic Spine W Wo Contrast  Result Date: 05/06/2017 CLINICAL DATA:  Generalized weakness with progression over the last several weeks. Personal history bladder cancer. Known metastatic disease. EXAM: MRI THORACIC AND LUMBAR SPINE WITHOUT AND WITH CONTRAST TECHNIQUE: Multiplanar and multiecho pulse sequences of the thoracic and lumbar spine were obtained without and with intravenous contrast. CONTRAST:  76mL MULTIHANCE GADOBENATE DIMEGLUMINE 529 MG/ML IV SOLN COMPARISON:  CT of the abdomen and pelvis 01/20/2017. FINDINGS: MRI THORACIC SPINE FINDINGS Alignment: AP alignment is anatomic. Exaggerated kyphosis is present in the lower thoracic spine associated with compression fractures. Rightward curvature of the thoracic spine is centered at T6. Vertebrae: There is diffuse decreased T1 marrow signal. Vertebral body heights are maintained through T11. Enhancing lesion along the superior endplate of O27 measures 13 mm. A 7 mm central enhancing lesion is present at T11. Remote T11  and T12 compression fractures are present without definite enhancement. Tumor is not excluded. Cord:  Normal signal is present throughout the thoracic spinal cord. Paraspinal and other soft tissues: A right paraspinal soft tissue mass is T6-7 measures 5.5 x 5.4 x 2.8 cm. This infiltrates the neural foramina at T6-7 and to lesser stent T6-8. An enlarged retrocrural lymph node on the right measures 10 x 18 mm. Disc levels: No significant disc disease is present. Right foraminal narrowing is present at C6-7 due to tumor infiltration. There is some narrowing at T7-8 is well. Osseous foraminal narrowing is secondary to remote compression fractures and facet hypertrophy at T11-12 on the left. MRI LUMBAR SPINE FINDINGS Segmentation: 5 non rib-bearing lumbar type vertebral bodies are present. Alignment: AP alignment is anatomic. Rightward curvature is centered at L3. Vertebrae: Focal tumor enhancement is noted along the superior endplate of L5 on the right measuring 1.7 cm. There is punctate enhancement along the inferior endplate of L3. Remote compression fractures of lumbar spine are most evident at L3-4 and L4-5. The T12 compression fractures noted. Enhancement anteriorly at L1 may reflect metastatic disease versus degenerative change. Multiple enhancing lesions are present within the right iliac bone. Right greater than left sacral lesions are present as well. Scattered hemangiomas are present. Conus medullaris: Extends to the L1 level and appears normal. Paraspinal and other soft tissues: Extensive right para-aortic and renal hilar adenopathy is present. Right nephrectomy is noted. A soft tissue tumor on the right at the L2 level measures 10 x 6 x 8.4 cm. Multiple other smaller para-aortic nodes  are present. Hepatic lesions are suspected. The left kidney demonstrates benign appearing exophytic cysts Disc levels: L1-2: Mild central and bilateral foraminal narrowing is secondary to the a broad-based disc protrusion and  facet hypertrophy. L2-3: A broad-based disc protrusion is present. Facet hypertrophy leads to moderate central canal stenosis and moderate foramina foraminal narrowing, left greater than right. L3-4: Moderate subarticular narrowing is worse on the right. Mild foraminal narrowing is present bilaterally. L4-5: A broad-based disc protrusion is present. Facet spurring contributes to mild right foraminal narrowing. Mild subarticular narrowing is evident bilaterally. L5-S1: Facet spurring is present bilaterally. Mild foraminal narrowing is worse on the right. IMPRESSION: 1. Multiple metastatic lesions to the thoracic and lumbar spine as described. 2. Right paraspinous soft tissue mass at the T6-7 level with extension into the T6 and T7 pedicles aunt T6-7 foramen. 3. Retrocrural and para-aortic adenopathy compatible with metastatic disease. 4. 10 cm aortocaval soft tissue mass compatible with metastatic disease. 5. Remote fractures at T12 L3 and L4 do not appear to be pathologic. 6. The largest bone metastases are at L5, T10, and right greater than left sacroiliac joints. Electronically Signed   By: San Morelle M.D.   On: 05/06/2017 18:28   Mr Lumbar Spine W Wo Contrast  Result Date: 05/06/2017 CLINICAL DATA:  Generalized weakness with progression over the last several weeks. Personal history bladder cancer. Known metastatic disease. EXAM: MRI THORACIC AND LUMBAR SPINE WITHOUT AND WITH CONTRAST TECHNIQUE: Multiplanar and multiecho pulse sequences of the thoracic and lumbar spine were obtained without and with intravenous contrast. CONTRAST:  79mL MULTIHANCE GADOBENATE DIMEGLUMINE 529 MG/ML IV SOLN COMPARISON:  CT of the abdomen and pelvis 01/20/2017. FINDINGS: MRI THORACIC SPINE FINDINGS Alignment: AP alignment is anatomic. Exaggerated kyphosis is present in the lower thoracic spine associated with compression fractures. Rightward curvature of the thoracic spine is centered at T6. Vertebrae: There is diffuse  decreased T1 marrow signal. Vertebral body heights are maintained through T11. Enhancing lesion along the superior endplate of U93 measures 13 mm. A 7 mm central enhancing lesion is present at T11. Remote T11 and T12 compression fractures are present without definite enhancement. Tumor is not excluded. Cord:  Normal signal is present throughout the thoracic spinal cord. Paraspinal and other soft tissues: A right paraspinal soft tissue mass is T6-7 measures 5.5 x 5.4 x 2.8 cm. This infiltrates the neural foramina at T6-7 and to lesser stent T6-8. An enlarged retrocrural lymph node on the right measures 10 x 18 mm. Disc levels: No significant disc disease is present. Right foraminal narrowing is present at C6-7 due to tumor infiltration. There is some narrowing at T7-8 is well. Osseous foraminal narrowing is secondary to remote compression fractures and facet hypertrophy at T11-12 on the left. MRI LUMBAR SPINE FINDINGS Segmentation: 5 non rib-bearing lumbar type vertebral bodies are present. Alignment: AP alignment is anatomic. Rightward curvature is centered at L3. Vertebrae: Focal tumor enhancement is noted along the superior endplate of L5 on the right measuring 1.7 cm. There is punctate enhancement along the inferior endplate of L3. Remote compression fractures of lumbar spine are most evident at L3-4 and L4-5. The T12 compression fractures noted. Enhancement anteriorly at L1 may reflect metastatic disease versus degenerative change. Multiple enhancing lesions are present within the right iliac bone. Right greater than left sacral lesions are present as well. Scattered hemangiomas are present. Conus medullaris: Extends to the L1 level and appears normal. Paraspinal and other soft tissues: Extensive right para-aortic and renal hilar adenopathy is  present. Right nephrectomy is noted. A soft tissue tumor on the right at the L2 level measures 10 x 6 x 8.4 cm. Multiple other smaller para-aortic nodes are present.  Hepatic lesions are suspected. The left kidney demonstrates benign appearing exophytic cysts Disc levels: L1-2: Mild central and bilateral foraminal narrowing is secondary to the a broad-based disc protrusion and facet hypertrophy. L2-3: A broad-based disc protrusion is present. Facet hypertrophy leads to moderate central canal stenosis and moderate foramina foraminal narrowing, left greater than right. L3-4: Moderate subarticular narrowing is worse on the right. Mild foraminal narrowing is present bilaterally. L4-5: A broad-based disc protrusion is present. Facet spurring contributes to mild right foraminal narrowing. Mild subarticular narrowing is evident bilaterally. L5-S1: Facet spurring is present bilaterally. Mild foraminal narrowing is worse on the right. IMPRESSION: 1. Multiple metastatic lesions to the thoracic and lumbar spine as described. 2. Right paraspinous soft tissue mass at the T6-7 level with extension into the T6 and T7 pedicles aunt T6-7 foramen. 3. Retrocrural and para-aortic adenopathy compatible with metastatic disease. 4. 10 cm aortocaval soft tissue mass compatible with metastatic disease. 5. Remote fractures at T12 L3 and L4 do not appear to be pathologic. 6. The largest bone metastases are at L5, T10, and right greater than left sacroiliac joints. Electronically Signed   By: San Morelle M.D.   On: 05/06/2017 18:28    Medications: I have reviewed the patient's current medications.  Assessment/Plan:  1. Urothelial carcinoma, initially diagnosed in 2015 with noninvasive urothelial carcinoma the bladder, status post resection followed by BCG therapy  Multiple recurrences of noninvasive bladder carcinoma, last underwent resection in 2016  Right renal pelvis tumor December 2017, biopsy January 2018 confirmed high-grade urothelial carcinoma  Right nephroureterectomy March 2018 confirmed a high-grade invasive urothelial carcinoma of the right kidney, T4, with lymphovascular  invasion  Gross hematuria May 2018-status post removal of retained clot and resection of urothelial carcinoma at the prostate  CT head 05/04/2017 confirmed a mass at the junction of the right maxillary sinus and inferior aspect of the zygomatic arch  MRI thoracic/lumbar spine 05/06/2017 confirmed multiple metastatic lesions, a right paraspinous soft tissue mass at T6-T7, retrocrural and periaortic adenopathy, 10 cm aortocaval mass  2. Back/chest pain-likely secondary to bone metastases  3. Right leg weakness-potentially secondary to nerve root compression for metastatic urothelial carcinoma, though no cord compression is noted on the MRI 05/06/2017  4.  Congestive heart failure  5.  Anemia  6.  Cirrhosis  7.  Hypercalcemia   I discussed the situation with Mr. Shallenberger, his son, and daughter. He appears to have metastatic urothelial carcinoma. He is scheduled for a biopsy in interventional radiology today. I will follow-up on the pathology. We will discuss systemic treatment options if a diagnosis of metastatic urothelial carcinoma is confirmed. I recommend he undergo staging CTs of the chest, abdomen, and pelvis.  He has a poor performance status and may require skilled nursing facility placement at discharge. We will discuss the indication for systemic therapy versus Hospice care when a diagnosis is confirmed.  Recommendations: 1. Proceed with diagnostic biopsy today 2. Check ionized calcium 3. Staging CTs of the chest, abdomen, and pelvis   LOS: 4 days   Donneta Romberg, MD   05/08/2017, 8:43 AM

## 2017-05-08 NOTE — Sedation Documentation (Signed)
Delay in start due to difficulty with positioning the patient for scanning.

## 2017-05-08 NOTE — Progress Notes (Signed)
PROGRESS NOTE    Victor Wang  PJA:[id number] DOB: 1935/04/06 DOA: 05/04/2017 PCP: Townsend Roger, MD   Brief Narrative:  Victor Wang is a 82 y.o. male with medical history significant for chronic CHF and recurrent urothelial carcinoma status post right nephroureterectomy in March 2018, now presenting to the emergency department with increasing weakness and falls, progressive lower extremity edema, and shortness of breath. Patient has been noted to grow increasingly weak, generally, over the past couple weeks, but much more acutely since 04/29/2017. He has had multiple falls since that time, but none with head injury or loss of consciousness. He reports weakness involving all of his extremities, but with legs more so than arms, and perhaps the right leg more so than the left. He denies any numbness, denies headache, and denies change in vision or hearing. His Lasix had been held for the last several days due to increasing weakness, but he has since developed progressive lower extremity edema and shortness of breath. Also concerning is an enlarging nodule under the right eye, first noted a couple weeks ago and growing. Patient reports that it is tender to palpation, but otherwise does not bother him.     Noncontrast head CT was negative for acute intracranial abnormality, but maxillofacial CT demonstrates a 3.2 cm mass at the junction of the right anterolateral maxillary sinus and anterior aspect of the zygomatic arch with mild periostitis and concerning for possible primary bone tumor or a metastasis. Neurology was consulted by the ED physician and has evaluated the patient in the emergency department. Patient remained hemodynamically stable and has not been in acute respiratory distress. He was  admitted to the telemetry unit for ongoing evaluation and management of acute on chronic CHF and generalized weakness, with increased weakness involving the right leg in the presence of new worsening  back pain concerning for metastatic disease. CT Maxillofacial also showed small metallic densities in the Right and left Superficial Soft tissues. He underwent Eye X-Ray which revealed that he had Metallic foci imbedded in the skin of the nose, medial right orbit, and central left orbit. Patient states that they have been there for a while now. MRI of the Thoracic and Lumbar Spine was ordered and could not be done at [facility] as patient was too big to fit in MRI machine so will be transferred to [facility] to have it done and brought back. Patient also underwent and ECHOCardiogram 05/05/17. Patient complained of Constipation 05/06/17 so was given a bisacodyl suppository and Increased Bowel Regimen.   MRI was done 05/06/10 at [facility] and shows metastatic lesions to the thoracic and lumbar spine. Consulted Oncology Dr. Benay Spice for further recommendations and he is ordering CT of the Chest/Abdomen/Pelvis for Staging. Patient underwent CT Guided Biopsy of T6 Paraspinal Mass today.    Assessment & Plan:   Principal Problem:   Right leg weakness Active Problems:   Bladder cancer (HCC)   Acute CHF (HCC)   Essential hypertension   Cancer of renal pelvis, right (HCC)   Facial mass   Leg weakness   Pressure injury of skin   Iron deficiency anemia   GERD (gastroesophageal reflux disease)   Hyperlipidemia   Hypercalcemia   Hypomagnesemia  Generalized Weakness and Deconditioning 2/2 to Metastaic Disease - Pt presents with generalized weakness, progressing in recent weeks, most notable in bilateral legs and leading to falls  - Head CT is negative for acute intracranial abnormality - Neurology has evaluated the pt in ED  and their consultation much appreciated; Neurology signed off as they he has a T6 Radiculopathy likley from Metastasis -Neurology feels as if falls are multifactorial with generalized weakness and pain as significant factors; Feel as MRI C-Spine would be of low yield regarding spinal  cord pathology - MRI lumbar and thoracic spine advised for suspected metastatic disease; Concern as Alk Phos was also elevated - MRI T- and L-spine showed multiple metastatic lesions to the thoracic and lumbar spine as well as a right Paraspinous soft tissue mass at the T6-7 Level with extension into the T6 and T7 Pedicles and T6-7 Foramen. There were also retrocrural and paraaortic adenopathy compatible with metastatic disease as well as a a 10 cm aortocaval soft tissue mass compatible with metastatic disease. MRI also noted remote fractures at T12, L3, and L4 and the largest Bone metastases are at L5, T10, and the Right Greater than Left sacroiliac joints.  - PT to Evaluate however fatigue caused patient to limit his ablilty to participate  - Pain control with Fentanyl 25-50 mcg IV q2hprn and C/w Norco 1 tab po q4hprn  - Discussed with Dr. Benay Spice of Oncology and feels as if this is metastatic urothelial carcinoma and recommended Trial of Decadron 10 mg IV q12h for Right Leg weakness as it may be related to peripheral nerve compression from the metastatic tumor.  -IR Consulted and biopsied Paraspinal Mass at T6; Sq Lovenox will be resumed at 22:00 today -Dr Learta Codding will be ordering CT of Chest/Abdomen/Pelvis for Staging -Follow up Biopsy results  -PT/OT To evaluate and treat   Acute on Chronic Diastolic CHF  - Pt presents with progressive BLE edema and DOE, found to have crackles, elevated BNP, interstitial edema on CXR  - No ECHO was on file  - BNP was 113.6 - He is managed at home with Lasix 40 mg BID and metoprolol  - Plan to SLIV, follow daily wts and strict I/O's,  -Continue metoprolol as tolerated, and diurese with Lasix 40 mg IV q12h and likely cut back diuresis in AM - Echocardiogram ordered and showed that it was difficult to seen endocardium but wall thickness was increased in a pattern of severe LVH but normal Systolic Fxc and an estimated EF of 50-55% with G1DD.  -Patient is -  4.083 L; Patient's Weight is down 4 lbs  Facial mass  - Pt presents with firm nodule inferior to right eye, growing over the past 2 wks  - CT maxillofacial suggests a primary bone tumor vs metastatic disease  - MRI done and it is likely metastatic disease given findings on L and T spine MRI  Hypertension  - BP is at goal and actually on the lower side 104/57 - Continue Metoprolol 50 mg po Daily as tolerated   Urothelial carcinoma which was recurrent and Renal Cancer and suspect Metastatic Disease - Status-post right nephroureterectomy in March '18, underwent TURBT in early May '18  - Follows with Dr. Tresa Moore of Urology  - Patient has metastatic disease as above but will await biopsy results to confirm if it is Urothelial Carcinoma or not -CT of Chest/Abdomen/Pelvis being done for Staging  -PT to evaluate and Treat; Patient has poor functional status -Possible Systemic Therapy vs. Hospice depending on progression and confirmation of Diagnosis   Iron-deficiency Anemia  - Hgb is stable at 9.1 on admission and no bleeding is evident; -Hb/Hct went from 9.0/28.4 -> 8.7/27.4 -> 8.7/27.0 - Continue iron-supplementation with Ferrous Sulfate 325 mg po BID -Repeat CBC in  AM  GERD -C/w Pantoprazole 40 mg po Daily  Hyperlipidemia -C/w Pravastatin 80 mg po Daily  Hypomagnesemia -Patient's Mag Level was 1.9 -Continue to Monitor and Replete Mag as Necessary -Repeat Mag Level in AM  Mild Hypercalcemia -Was mildly elevated and went from 10.4 -> 10.2 -> 10.0 -> 10.2 -Ionized Calcium being Checked by Oncology -? Related to Bone Malignancy  -Repeat CMP in AM   Pressure Ulcer on Sacrum  -Mild and poA -WOC Nurse evaluated and left Recc's and appreciate them -Per [place] nurse turn and reposition patient from side to side-avoiding the supine position.  Additionally, we will provide Pressure Redistribution heel boots to prevent pressure injury while in bed.  A pressure redistribution chair cushion  is ordered for use while OOB in chair and post discharge.  Constipation, improved -Scheduled Miralax BID and added Senna Docusate 1 tab po BID -Gave Bisacodyl Suppository 10 mg once.  -Continue to Monitor   DVT prophylaxis: Enoxaparin 40 mg sq q24h (Held for Biopsy in AM) Code Status: FULL CODE Family Communication: Discussed with Son at Bedside Disposition Plan: Remain Inpatient as Work Up is in Progress; PT to Evaluate   Consultants:   Neurology Dr. Leonel Ramsay  Oncology Dr. Benay Spice  Interventional Radiology   Procedures:  ECHOCARDIOGRAM Study Conclusions  - Procedure narrative: Transthoracic echocardiography. Image   quality was suboptimal. The study was technically difficult, as a   result of poor acoustic windows, poor sound wave transmission,   restricted patient mobility, and body habitus. Intravenous   contrast (Definity) was administered. - Left ventricle: Ver poor image quality even with definity   Difficult to see endocardium Consider cardiac MRI if clinically   indicated to more acurately asses EF. Wall thickness was   increased in a pattern of severe LVH. Systolic function was   normal. The estimated ejection fraction was in the range of 50%   to 55%. Doppler parameters are consistent with abnormal left   ventricular relaxation (grade 1 diastolic dysfunction). - Atrial septum: No defect or patent foramen ovale was identified.  Antimicrobials:  Anti-infectives    None     Subjective: Seen and examined and stated he was doing ok. Was going to go for biopsy later. No nausea or vomiting. States he is still urinating a lot. Felt weak and states its hard for him to move.    Objective: Vitals:   05/08/17 1055 05/08/17 1100 05/08/17 1101 05/08/17 1315  BP: (!) 142/83 (!) 142/83 (!) 144/88 108/65  Pulse: 84 83 84 82  Resp: (!) 21 17 17 16   Temp:    97.5 F (36.4 C)  TempSrc:    Axillary  SpO2: 92% 92% 92% 95%  Weight:      Height:         Intake/Output Summary (Last 24 hours) at 05/08/17 1706 Last data filed at 05/08/17 1500  Gross per 24 hour  Intake              717 ml  Output             1075 ml  Net             -358 ml   Filed Weights   05/06/17 1003 05/07/17 0510 05/08/17 0448  Weight: 128.6 kg (283 lb 8.2 oz) 128.8 kg (283 lb 15.2 oz) 128 kg (282 lb 3 oz)   Examination: Physical Exam:  Constitutional: Pleasant 82 year old Male who appears discouraged due to the events of the last few days.  Eyes: Sclerae anicteric; Has lesion under right eye ENMT: Grossly normal hearing. Patient had normal appearing ears and nose Neck: Supple. Mild JVD Respiratory: Diminished to Auscultation. No wheezes/rales/rhonchi. Patient was not tachypenic or using accessory muscles to breathe but was wearing supplemental O2 via [place] Cardiovascular: RRR; S1 S2; Mild Lower Extremity Edema Abdomen: Soft; Mildly tender to palpate; Distended due to body habitus; Bowel Sounds present GU: Deferred; Has condom cath on and draining yellow/amber urine Musculoskeletal: No cyanosis; No contractures Skin: Warm and Dry; Has lesion under Right eye Neurologic: Had some Right leg weakness. CN 2-12 grossly intact; Sensation is intact Psychiatric: Depressed mood and flat affect. Awake and alert  Data Reviewed: I have personally reviewed following labs and imaging studies  CBC:  Recent Labs Lab 05/04/17 1859 05/06/17 0533 05/07/17 0507 05/08/17 0507  WBC 7.2 8.4 9.9 4.3  NEUTROABS 6.0 6.6 8.3* 3.9  HGB 9.1* 9.0* 8.7* 8.7*  HCT 28.8* 28.4* 27.4* 27.0*  MCV 93.2 92.8 91.6 91.8  PLT 197 196 208 885   Basic Metabolic Panel:  Recent Labs Lab 05/04/17 1859 05/05/17 0530 05/06/17 0533 05/07/17 0507 05/08/17 0507  NA 135 137 135 135 132*  K 4.2 4.0 3.8 3.9 4.0  CL 98* 99* 95* 94* 94*  CO2 29 29 31 29 29   GLUCOSE 124* 114* 112* 95 205*  BUN 23* 23* 27* 30* 36*  CREATININE 0.98 0.98 1.07 1.08 0.99  CALCIUM 10.3 10.4* 10.2 10.0 10.2  MG   --   --  1.7 1.6* 1.9  PHOS  --   --  3.8 3.5 3.2   GFR: Estimated Creatinine Clearance: 83.7 mL/min (by C-G formula based on SCr of 0.99 mg/dL). Liver Function Tests:  Recent Labs Lab 05/04/17 1859 05/06/17 0533 05/07/17 0507 05/08/17 0507  AST 39 38 34 37  ALT 36 32 29 28  ALKPHOS 359* 366* 326* 320*  BILITOT 1.1 1.0 1.2 0.9  PROT 6.4* 6.1* 5.9* 6.1*  ALBUMIN 2.8* 2.6* 2.5* 2.4*   No results for input(s): LIPASE, AMYLASE in the last 168 hours. No results for input(s): AMMONIA in the last 168 hours. Coagulation Profile:  Recent Labs Lab 05/04/17 1859  INR 1.29   Cardiac Enzymes:  Recent Labs Lab 05/04/17 1859  TROPONINI <0.03   BNP (last 3 results) No results for input(s): PROBNP in the last 8760 hours. HbA1C: No results for input(s): HGBA1C in the last 72 hours. CBG:  Recent Labs Lab 05/08/17 1152  GLUCAP 178*   Lipid Profile: No results for input(s): CHOL, HDL, LDLCALC, TRIG, CHOLHDL, LDLDIRECT in the last 72 hours. Thyroid Function Tests: No results for input(s): TSH, T4TOTAL, FREET4, T3FREE, THYROIDAB in the last 72 hours. Anemia Panel: No results for input(s): VITAMINB12, FOLATE, FERRITIN, TIBC, IRON, RETICCTPCT in the last 72 hours. Sepsis Labs: No results for input(s): PROCALCITON, LATICACIDVEN in the last 168 hours.  No results found for this or any previous visit (from the past 240 hour(s)).   Radiology Studies: Mr Thoracic Spine W Wo Contrast  Result Date: 05/06/2017 CLINICAL DATA:  Generalized weakness with progression over the last several weeks. Personal history bladder cancer. Known metastatic disease. EXAM: MRI THORACIC AND LUMBAR SPINE WITHOUT AND WITH CONTRAST TECHNIQUE: Multiplanar and multiecho pulse sequences of the thoracic and lumbar spine were obtained without and with intravenous contrast. CONTRAST:  4m MULTIHANCE GADOBENATE DIMEGLUMINE 529 MG/ML IV SOLN COMPARISON:  CT of the abdomen and pelvis 01/20/2017. FINDINGS: MRI THORACIC  SPINE FINDINGS Alignment: AP alignment is anatomic. Exaggerated  kyphosis is present in the lower thoracic spine associated with compression fractures. Rightward curvature of the thoracic spine is centered at T6. Vertebrae: There is diffuse decreased T1 marrow signal. Vertebral body heights are maintained through T11. Enhancing lesion along the superior endplate of D53 measures 13 mm. A 7 mm central enhancing lesion is present at T11. Remote T11 and T12 compression fractures are present without definite enhancement. Tumor is not excluded. Cord:  Normal signal is present throughout the thoracic spinal cord. Paraspinal and other soft tissues: A right paraspinal soft tissue mass is T6-7 measures 5.5 x 5.4 x 2.8 cm. This infiltrates the neural foramina at T6-7 and to lesser stent T6-8. An enlarged retrocrural lymph node on the right measures 10 x 18 mm. Disc levels: No significant disc disease is present. Right foraminal narrowing is present at C6-7 due to tumor infiltration. There is some narrowing at T7-8 is well. Osseous foraminal narrowing is secondary to remote compression fractures and facet hypertrophy at T11-12 on the left. MRI LUMBAR SPINE FINDINGS Segmentation: 5 non rib-bearing lumbar type vertebral bodies are present. Alignment: AP alignment is anatomic. Rightward curvature is centered at L3. Vertebrae: Focal tumor enhancement is noted along the superior endplate of L5 on the right measuring 1.7 cm. There is punctate enhancement along the inferior endplate of L3. Remote compression fractures of lumbar spine are most evident at L3-4 and L4-5. The T12 compression fractures noted. Enhancement anteriorly at L1 may reflect metastatic disease versus degenerative change. Multiple enhancing lesions are present within the right iliac bone. Right greater than left sacral lesions are present as well. Scattered hemangiomas are present. Conus medullaris: Extends to the L1 level and appears normal. Paraspinal and other  soft tissues: Extensive right para-aortic and renal hilar adenopathy is present. Right nephrectomy is noted. A soft tissue tumor on the right at the L2 level measures 10 x 6 x 8.4 cm. Multiple other smaller para-aortic nodes are present. Hepatic lesions are suspected. The left kidney demonstrates benign appearing exophytic cysts Disc levels: L1-2: Mild central and bilateral foraminal narrowing is secondary to the a broad-based disc protrusion and facet hypertrophy. L2-3: A broad-based disc protrusion is present. Facet hypertrophy leads to moderate central canal stenosis and moderate foramina foraminal narrowing, left greater than right. L3-4: Moderate subarticular narrowing is worse on the right. Mild foraminal narrowing is present bilaterally. L4-5: A broad-based disc protrusion is present. Facet spurring contributes to mild right foraminal narrowing. Mild subarticular narrowing is evident bilaterally. L5-S1: Facet spurring is present bilaterally. Mild foraminal narrowing is worse on the right. IMPRESSION: 1. Multiple metastatic lesions to the thoracic and lumbar spine as described. 2. Right paraspinous soft tissue mass at the T6-7 level with extension into the T6 and T7 pedicles aunt T6-7 foramen. 3. Retrocrural and para-aortic adenopathy compatible with metastatic disease. 4. 10 cm aortocaval soft tissue mass compatible with metastatic disease. 5. Remote fractures at T12 L3 and L4 do not appear to be pathologic. 6. The largest bone metastases are at L5, T10, and right greater than left sacroiliac joints. Electronically Signed   By: San Morelle M.D.   On: 05/06/2017 18:28   Mr Lumbar Spine W Wo Contrast  Result Date: 05/06/2017 CLINICAL DATA:  Generalized weakness with progression over the last several weeks. Personal history bladder cancer. Known metastatic disease. EXAM: MRI THORACIC AND LUMBAR SPINE WITHOUT AND WITH CONTRAST TECHNIQUE: Multiplanar and multiecho pulse sequences of the thoracic and  lumbar spine were obtained without and with intravenous contrast. CONTRAST:  20m MULTIHANCE GADOBENATE DIMEGLUMINE 529 MG/ML IV SOLN COMPARISON:  CT of the abdomen and pelvis 01/20/2017. FINDINGS: MRI THORACIC SPINE FINDINGS Alignment: AP alignment is anatomic. Exaggerated kyphosis is present in the lower thoracic spine associated with compression fractures. Rightward curvature of the thoracic spine is centered at T6. Vertebrae: There is diffuse decreased T1 marrow signal. Vertebral body heights are maintained through T11. Enhancing lesion along the superior endplate of TH41measures 13 mm. A 7 mm central enhancing lesion is present at T11. Remote T11 and T12 compression fractures are present without definite enhancement. Tumor is not excluded. Cord:  Normal signal is present throughout the thoracic spinal cord. Paraspinal and other soft tissues: A right paraspinal soft tissue mass is T6-7 measures 5.5 x 5.4 x 2.8 cm. This infiltrates the neural foramina at T6-7 and to lesser stent T6-8. An enlarged retrocrural lymph node on the right measures 10 x 18 mm. Disc levels: No significant disc disease is present. Right foraminal narrowing is present at C6-7 due to tumor infiltration. There is some narrowing at T7-8 is well. Osseous foraminal narrowing is secondary to remote compression fractures and facet hypertrophy at T11-12 on the left. MRI LUMBAR SPINE FINDINGS Segmentation: 5 non rib-bearing lumbar type vertebral bodies are present. Alignment: AP alignment is anatomic. Rightward curvature is centered at L3. Vertebrae: Focal tumor enhancement is noted along the superior endplate of L5 on the right measuring 1.7 cm. There is punctate enhancement along the inferior endplate of L3. Remote compression fractures of lumbar spine are most evident at L3-4 and L4-5. The T12 compression fractures noted. Enhancement anteriorly at L1 may reflect metastatic disease versus degenerative change. Multiple enhancing lesions are present  within the right iliac bone. Right greater than left sacral lesions are present as well. Scattered hemangiomas are present. Conus medullaris: Extends to the L1 level and appears normal. Paraspinal and other soft tissues: Extensive right para-aortic and renal hilar adenopathy is present. Right nephrectomy is noted. A soft tissue tumor on the right at the L2 level measures 10 x 6 x 8.4 cm. Multiple other smaller para-aortic nodes are present. Hepatic lesions are suspected. The left kidney demonstrates benign appearing exophytic cysts Disc levels: L1-2: Mild central and bilateral foraminal narrowing is secondary to the a broad-based disc protrusion and facet hypertrophy. L2-3: A broad-based disc protrusion is present. Facet hypertrophy leads to moderate central canal stenosis and moderate foramina foraminal narrowing, left greater than right. L3-4: Moderate subarticular narrowing is worse on the right. Mild foraminal narrowing is present bilaterally. L4-5: A broad-based disc protrusion is present. Facet spurring contributes to mild right foraminal narrowing. Mild subarticular narrowing is evident bilaterally. L5-S1: Facet spurring is present bilaterally. Mild foraminal narrowing is worse on the right. IMPRESSION: 1. Multiple metastatic lesions to the thoracic and lumbar spine as described. 2. Right paraspinous soft tissue mass at the T6-7 level with extension into the T6 and T7 pedicles aunt T6-7 foramen. 3. Retrocrural and para-aortic adenopathy compatible with metastatic disease. 4. 10 cm aortocaval soft tissue mass compatible with metastatic disease. 5. Remote fractures at T12 L3 and L4 do not appear to be pathologic. 6. The largest bone metastases are at L5, T10, and right greater than left sacroiliac joints. Electronically Signed   By: CSan MorelleM.D.   On: 05/06/2017 18:28   Ct Biopsy  Result Date: 05/08/2017 INDICATION: 82year old male with a history of urothelial cell carcinoma. Now with multiple  lesions on CT concerning for metastases. EXAM: CT BIOPSY MEDICATIONS: None. ANESTHESIA/SEDATION: Moderate (  conscious) sedation was employed during this procedure. A total of Versed 2.0 mg and Fentanyl 100 mcg was administered intravenously. Moderate Sedation Time: 10 minutes. The patient's level of consciousness and vital signs were monitored continuously by radiology nursing throughout the procedure under my direct supervision. FLUOROSCOPY TIME:  CT COMPLICATIONS: None PROCEDURE: Informed written consent was obtained from the patient after a thorough discussion of the procedural risks, benefits and alternatives. All questions were addressed. Maximal Sterile Barrier Technique was utilized including caps, mask, sterile gowns, sterile gloves, sterile drape, hand hygiene and skin antiseptic. A timeout was performed prior to the initiation of the procedure. Patient is positioned left decubitus position on the CT gantry table. Scout CT of the thoracic region performed for planning purposes. The patient is then prepped and draped in the usual sterile fashion and the skin and subcutaneous tissues were generously infiltrated with 1% lidocaine for local anesthesia. Using CT guidance, 17 gauge guide needle was advanced into right paraspinal mass involving the head of the right sixth rib. Multiple 18 gauge core biopsy were performed. Samples placed into formalin. Needle was removed and sterile bandage was placed. Patient tolerated the procedure well and remained hemodynamically stable throughout. No complications were encountered and no significant blood loss. FINDINGS: Scout CT demonstrates soft tissue mass involving the head of the right sixth rib, paraspinal location, seen previously on MRI. Evidence of additional bony metastases involving the sternum/manubrium, as well as the left second rib, fifth rib, with pulmonary nodules bilaterally concerning for additional metastases. Images during the case demonstrate needle  placement within the lesion and the right sixth rib/paraspinal location. IMPRESSION: Status post CT-guided biopsy of right paraspinal lesion at the level of T6. Tissue specimen sent to pathology for complete histopathologic analysis. The scout CT images demonstrate nodule of bilateral lungs concerning for metastases, as well additional site of metastasis involving the left second and fifth ribs. PET-CT may be useful for further evaluation. Signed, Dulcy Fanny. Earleen Newport, DO [facility] Electronically Signed   By: Corrie Mckusick D.O.   On: 05/08/2017 11:42   Scheduled Meds: . allopurinol  100 mg Oral Daily  . calcium-vitamin D  1 tablet Oral Q breakfast  . chlorhexidine  15 mL Mouth Rinse BID  . dexamethasone  10 mg Intravenous Q12H  . dextromethorphan-guaiFENesin  1 tablet Oral BID  . enoxaparin (LOVENOX) injection  40 mg Subcutaneous Q24H  . ferrous sulfate  325 mg Oral BID WC  . furosemide  40 mg Intravenous Q12H  . insulin aspart  0-5 Units Subcutaneous QHS  . insulin aspart  0-9 Units Subcutaneous TID WC  . ipratropium  0.5 mg Nebulization TID  . levalbuterol  0.63 mg Nebulization TID  . mouth rinse  15 mL Mouth Rinse q12n4p  . metoprolol tartrate  50 mg Oral Daily  . midazolam      . pantoprazole  40 mg Oral BID  . polyethylene glycol  17 g Oral BID  . pravastatin  80 mg Oral q1800  . senna-docusate  1 tablet Oral BID  . sodium chloride flush  3 mL Intravenous Q12H   Continuous Infusions: . sodium chloride      LOS: 4 days   Kerney Elbe, DO [facility] Pager [phone]  If 7PM-7AM, please contact night-coverage [email or web address] Password [facility] 05/08/2017, 5:06 PM

## 2017-05-08 NOTE — Procedures (Signed)
Interventional Radiology Procedure Note  Procedure: CT guided biopsy of right T6 paraspinal mass.  Suspicious for urothelial carcinoma mets. .  Complications: None Recommendations:  - OK from VIR standpoint for diet - routine wound care - follow up pathology - Do not submerge for 7 days    Signed,  Dulcy Fanny. Earleen Newport, DO

## 2017-05-08 NOTE — Progress Notes (Signed)
Bx site has remained CDI throughout shift.OOB to BSC.tol well

## 2017-05-08 NOTE — Progress Notes (Signed)
MEDICATION-RELATED CONSULT NOTE   IR Procedure Consult - Anticoagulant/Antiplatelet PTA/Inpatient Med List Review by Pharmacist    Procedure: CT guided biopsy of right T6 paraspinal mass    Completed: 05/08/17 at 11:16  Post-Procedural bleeding risk per IR MD assessment:  low  Antithrombotic medications on inpatient or PTA profile prior to procedure:     Enoxaparin 40 mg [place] q24h, next dose scheduled for 2200 on 05/08/17   Recommended restart time per IR Post-Procedure Guidelines:    At least 4 hours after or at next standard interval dose  Other considerations:      Plan:      Continue enoxaparin 40 mg [place] q24h at 2200 as scheduled.  Royetta Asal, PharmD, BCPS Pager [phone] 05/08/2017 12:08 PM

## 2017-05-08 NOTE — Consult Note (Signed)
   [facility] CM Inpatient Consult   05/08/2017  ALLIE GERHOLD 16-Aug-1935 [id number]    [facility] Care Management follow up. Chart reviewed. Appears discharge plan may be SNF. Will follow for progress and disposition needs. Made inpatient RNCM aware.  Marthenia Rolling, MSN-Ed, RN,BSN [facility] Liaison [phone]

## 2017-05-08 NOTE — Care Management Important Message (Signed)
Important Message  Patient Details  Name: Victor Wang MRN: [id number] Date of Birth: 09-04-1935   Medicare Important Message Given:  Yes    Kerin Salen 05/08/2017, 10:09 AMImportant Message  Patient Details  Name: Victor Wang MRN: [id number] Date of Birth: [birth year]   Medicare Important Message Given:  Yes    Kerin Salen 05/08/2017, 10:09 AM

## 2017-05-08 NOTE — Progress Notes (Signed)
Pt states that his son will be here to help him administer his home CPAP.  Pt to notify RT if any assistance is required through the night.  RT to monitor and assess as needed.

## 2017-05-09 ENCOUNTER — Encounter (HOSPITAL_COMMUNITY): Payer: Self-pay | Admitting: Radiology

## 2017-05-09 ENCOUNTER — Inpatient Hospital Stay (HOSPITAL_COMMUNITY): Payer: Medicare Other

## 2017-05-09 LAB — COMPREHENSIVE METABOLIC PANEL
ALBUMIN: 2.6 g/dL — AB (ref 3.5–5.0)
ALK PHOS: 344 U/L — AB (ref 38–126)
ALT: 33 U/L (ref 17–63)
AST: 45 U/L — ABNORMAL HIGH (ref 15–41)
Anion gap: 10 (ref 5–15)
BUN: 44 mg/dL — ABNORMAL HIGH (ref 6–20)
CALCIUM: 10.5 mg/dL — AB (ref 8.9–10.3)
CO2: 31 mmol/L (ref 22–32)
CREATININE: 0.97 mg/dL (ref 0.61–1.24)
Chloride: 91 mmol/L — ABNORMAL LOW (ref 101–111)
GFR calc non Af Amer: >60 mL/min (ref >60)
GLUCOSE: 146 mg/dL — AB (ref 65–99)
Potassium: 4.2 mmol/L (ref 3.5–5.1)
SODIUM: 132 mmol/L — AB (ref 135–145)
TOTAL PROTEIN: 6.3 g/dL — AB (ref 6.5–8.1)
Total Bilirubin: 0.9 mg/dL (ref 0.3–1.2)

## 2017-05-09 LAB — CBC WITH DIFFERENTIAL/PLATELET
BASOS PCT: 0 %
Basophils Absolute: 0 10*3/uL (ref 0.0–0.1)
EOS ABS: 0 10*3/uL (ref 0.0–0.7)
EOS PCT: 0 %
HCT: 28.2 % — ABNORMAL LOW (ref 39.0–52.0)
Hemoglobin: 9.1 g/dL — ABNORMAL LOW (ref 13.0–17.0)
Lymphocytes Relative: 4 %
Lymphs Abs: 0.3 10*3/uL — ABNORMAL LOW (ref 0.7–4.0)
MCH: 29.4 pg (ref 26.0–34.0)
MCHC: 32.3 g/dL (ref 30.0–36.0)
MCV: 91.3 fL (ref 78.0–100.0)
MONOS PCT: 5 %
Monocytes Absolute: 0.4 10*3/uL (ref 0.1–1.0)
Neutro Abs: 7.1 10*3/uL (ref 1.7–7.7)
Neutrophils Relative %: 91 %
PLATELETS: 223 10*3/uL (ref 150–400)
RBC: 3.09 MIL/uL — ABNORMAL LOW (ref 4.22–5.81)
RDW: 14.4 % (ref 11.5–15.5)
WBC: 7.8 10*3/uL (ref 4.0–10.5)

## 2017-05-09 LAB — CALCIUM, IONIZED: Calcium, Ionized, Serum: 5.9 mg/dL — ABNORMAL HIGH (ref 4.5–5.6)

## 2017-05-09 LAB — MAGNESIUM: Magnesium: 2 mg/dL (ref 1.7–2.4)

## 2017-05-09 LAB — GLUCOSE, CAPILLARY
GLUCOSE-CAPILLARY: 162 mg/dL — AB (ref 65–99)
Glucose-Capillary: 143 mg/dL — ABNORMAL HIGH (ref 65–99)
Glucose-Capillary: 114 mg/dL — ABNORMAL HIGH (ref 65–99)
Glucose-Capillary: 170 mg/dL — ABNORMAL HIGH (ref 65–99)

## 2017-05-09 LAB — PHOSPHORUS: Phosphorus: 2.5 mg/dL (ref 2.5–4.6)

## 2017-05-09 MED ORDER — FUROSEMIDE 10 MG/ML IJ SOLN
40.0000 mg | Freq: Every day | INTRAMUSCULAR | Status: DC
  Administered 2017-05-10: 40 mg via INTRAVENOUS
  Filled 2017-05-09: qty 4

## 2017-05-09 MED ORDER — LEVALBUTEROL HCL 0.63 MG/3ML IN NEBU: 0.6300 mg | INHALATION_SOLUTION | Freq: Four times a day (QID) | RESPIRATORY_TRACT | Status: DC | PRN

## 2017-05-09 MED ORDER — IOPAMIDOL (ISOVUE-300) INJECTION 61%
INTRAVENOUS | Status: AC
  Filled 2017-05-09: qty 30

## 2017-05-09 MED ORDER — ZOLEDRONIC ACID 4 MG/5ML IV CONC
4.0000 mg | Freq: Once | INTRAVENOUS | Status: AC
  Administered 2017-05-09: 4 mg via INTRAVENOUS
  Filled 2017-05-09: qty 5

## 2017-05-09 NOTE — Progress Notes (Signed)
Patient places self on CPAP when ready. RT will continue to monitor. 

## 2017-05-09 NOTE — NC FL2 (Signed)
[place] LEVEL OF CARE SCREENING TOOL     IDENTIFICATION  Patient Name: Victor Wang Birthdate: 05/07/1935 Sex: male Admission Date (Current Location): 05/04/2017  [facility] [place] Number:  Herbalist and Address:  [facility],  [place], [place]      Provider Number: [id number]  Attending Physician Name and Address:  Kerney Elbe, DO  Relative Name and Phone Number:       Current Level of Care: Hospital Recommended Level of Care: [place] Prior Approval Number:    Date Approved/Denied: 05/07/17 PASRR Number: [id number]  Discharge Plan: SNF    Current Diagnoses: Patient Active Problem List   Diagnosis Date Noted  . Hypomagnesemia 05/07/2017  . Pressure injury of skin 05/05/2017  . Iron deficiency anemia 05/05/2017  . GERD (gastroesophageal reflux disease) 05/05/2017  . Hyperlipidemia 05/05/2017  . Hypercalcemia 05/05/2017  . Leg weakness   . Facial mass 05/04/2017  . Right leg weakness 05/04/2017  . Urinary retention 03/28/2017  . H/O nephroureterectomy 02/01/2017  . Cancer of renal pelvis, right ([place]) 01/31/2017  . Acute CHF ([place]) 01/01/2017  . Acute blood loss anemia 01/01/2017  . Abdominal pain 01/01/2017  . Hypotension 01/01/2017  . OSA (obstructive sleep apnea) 01/01/2017  . Essential hypertension 01/01/2017  . Bladder cancer ([place]) 09/10/2015    Orientation RESPIRATION BLADDER Height & Weight     Self, Time, Situation, Place  O2 (Nasal Cannula; CPAP) Continent, External catheter Weight: 279 lb 1.6 oz (126.6 kg) Height:  6' (182.9 cm)  BEHAVIORAL SYMPTOMS/MOOD NEUROLOGICAL BOWEL NUTRITION STATUS      Continent Diet (Heart)  AMBULATORY STATUS COMMUNICATION OF NEEDS Skin   Total Care Verbally PU Stage and Appropriate Care  (PressureInjury06/08/18StageII-Partialthicknesslossofdermispresentingasashallowopenulcerwithared,pinkwoundbedwithoutslough.)   PU Stage 2 Dressing: No Dressing                   Personal Care Assistance Level of Assistance  Bathing, Feeding, Dressing Bathing Assistance: Maximum assistance Feeding assistance: Independent Dressing Assistance: Limited assistance     Functional Limitations Info  Sight, Hearing, Speech Sight Info: Adequate Hearing Info: Adequate Speech Info: Adequate    SPECIAL CARE FACTORS FREQUENCY  PT (By licensed PT), OT (By licensed OT)     PT Frequency: 5x/week OT Frequency: 5x/week            Contractures      Additional Factors Info  Code Status, Allergies Code Status Info: Full Code Allergies Info: Bee Venom, Penicillins, Tramadol           Current Medications (05/09/2017):  This is the current hospital active medication list Current Facility-Administered Medications  Medication Dose Route Frequency Provider Last Rate Last Dose  . 0.9 %  sodium chloride infusion  250 mL Intravenous PRN Opyd, Ilene Qua, MD      . acetaminophen (TYLENOL) tablet 650 mg  650 mg Oral Q4H PRN Opyd, Ilene Qua, MD   650 mg at 05/05/17 0025  . allopurinol (ZYLOPRIM) tablet 100 mg  100 mg Oral Daily Opyd, Ilene Qua, MD   100 mg at 05/09/17 0948  . chlorhexidine (PERIDEX) 0.12 % solution 15 mL  15 mL Mouth Rinse BID Opyd, Ilene Qua, MD   15 mL at 05/09/17 0948  . dexamethasone (DECADRON) injection 10 mg  10 mg Intravenous Q12H Sheikh, Georgina Quint [place], DO   10 mg at 05/09/17 1031  . dextromethorphan-guaiFENesin (MUCINEX DM) 30-600 MG per 12 hr tablet 1 tablet  1 tablet Oral  BID Raiford Noble [place], [place]   1 tablet at 05/09/17 0277  . enoxaparin (LOVENOX) injection 40 mg  40 mg Subcutaneous Q24H Docia Barrier, PA   40 mg at 05/08/17 2115  . fentaNYL (SUBLIMAZE) injection 25-50 mcg  25-50 mcg Intravenous Q2H PRN Opyd, Ilene Qua, MD   50 mcg at  05/08/17 1010  . ferrous sulfate tablet 325 mg  325 mg Oral BID WC Opyd, Ilene Qua, MD   325 mg at 05/09/17 0948  . [START ON 05/10/2017] furosemide (LASIX) injection 40 mg  40 mg Intravenous Daily Sheikh, Omair [place], DO      . HYDROcodone-acetaminophen (NORCO/VICODIN) 5-325 MG per tablet 1 tablet  1 tablet Oral Q4H PRN Opyd, Ilene Qua, MD   1 tablet at 05/09/17 1031  . insulin aspart (novoLOG) injection 0-5 Units  0-5 Units Subcutaneous QHS Sheikh, Omair Latif, DO      . insulin aspart (novoLOG) injection 0-9 Units  0-9 Units Subcutaneous TID WC Raiford Noble [place], DO   2 Units at 05/09/17 1325  . ipratropium (ATROVENT) nebulizer solution 0.5 mg  0.5 mg Nebulization TID Raiford Noble Latif, DO   0.5 mg at 05/08/17 2105  . levalbuterol (XOPENEX) nebulizer solution 0.63 mg  0.63 mg Nebulization TID Raiford Noble Latif, DO   0.63 mg at 05/08/17 2105  . MEDLINE mouth rinse  15 mL Mouth Rinse q12n4p Opyd, Ilene Qua, MD   15 mL at 05/09/17 1325  . metoprolol tartrate (LOPRESSOR) tablet 50 mg  50 mg Oral Daily Opyd, Ilene Qua, MD   50 mg at 05/09/17 0948  . ondansetron (ZOFRAN) injection 4 mg  4 mg Intravenous Q6H PRN Opyd, Ilene Qua, MD      . pantoprazole (PROTONIX) EC tablet 40 mg  40 mg Oral BID Raiford Noble [place], DO   40 mg at 05/09/17 0947  . polyethylene glycol (MIRALAX / GLYCOLAX) packet 17 g  17 g Oral BID Raiford Noble [place], DO   17 g at 05/08/17 2115  . pravastatin (PRAVACHOL) tablet 80 mg  80 mg Oral q1800 Vianne Bulls, MD   80 mg at 05/08/17 1717  . senna-docusate (Senokot-S) tablet 1 tablet  1 tablet Oral BID Raiford Noble [place], DO   1 tablet at 05/08/17 2115  . sodium chloride flush (NS) 0.9 % injection 3 mL  3 mL Intravenous Q12H Opyd, Ilene Qua, MD   3 mL at 05/09/17 1016  . sodium chloride flush (NS) 0.9 % injection 3 mL  3 mL Intravenous PRN Opyd, Ilene Qua, MD         Discharge Medications: Please see discharge summary for a list of discharge medications.  Relevant  Imaging Results:  Relevant Lab Results:   Additional Information SSN [id number]  Burnis Medin, LCSW

## 2017-05-09 NOTE — Evaluation (Signed)
Physical Therapy Evaluation Patient Details Name: Victor Wang MRN: [id number] DOB: 01-05-1935 Today's Date: 05/09/2017   History of Present Illness  82 y.o. male with medical history significant for chronic CHF and recurrent urothelial carcinoma status post right nephroureterectomy in March 2018, now presenting to the emergency department with increasing weakness and falls, progressive lower extremity edema, and shortness of breath. Suscpected mets to spine and zygomatic arch.   Clinical Impression  The patient required extensive assistance to stand from recliner. Would be very careful with standing as his leg could buckle. Pt admitted with above diagnosis. Pt currently with functional limitations due to the deficits listed below (see PT Problem List).  Pt will benefit from skilled PT to increase their independence and safety with mobility to allow discharge to the venue listed below.       Follow Up Recommendations  SNF    Equipment Recommendations    none   Recommendations for Other Services       Precautions / Restrictions Precautions Precautions: Fall Precaution Comments: legs could buckle      Mobility  Bed Mobility Overal bed mobility: Needs Assistance Bed Mobility: Sit to Supine       Sit to supine: +2 for physical assistance;Max assist   General bed mobility comments: assist back into bed  Transfers Overall transfer level: Needs assistance Equipment used: Rolling walker (2 wheeled) Transfers: Sit to/from [facility] Sit to Stand: Max assist;+2 physical assistance;+2 safety/equipment Stand pivot transfers: Max assist;+2 physical assistance;+2 safety/equipment       General transfer comment: count 123 to stand, very careful support of the knees to ensure no bucjkling. small shffling to turn to bed.  Ambulation/Gait                Stairs            Wheelchair Mobility    Modified Rankin (Stroke Patients Only)        Balance                                             Pertinent Vitals/Pain Pain Assessment: No/denies pain    Home Living Family/patient expects to be discharged to:: Skilled nursing facility                      Prior Function                 Hand Dominance        Extremity/Trunk Assessment   Upper Extremity Assessment Upper Extremity Assessment: Generalized weakness    Lower Extremity Assessment Lower Extremity Assessment: LLE deficits/detail;RLE deficits/detail RLE Deficits / Details: decreased hip flexion LLE Deficits / Details: grossly 3+       Communication      Cognition Arousal/Alertness: Awake/alert Behavior During Therapy: WFL for tasks assessed/performed Overall Cognitive Status: Within Functional Limits for tasks assessed                                 General Comments: likes to be in charge      General Comments      Exercises     Assessment/Plan    PT Assessment Patient needs continued PT services  PT Problem List Decreased strength;Decreased activity tolerance;Decreased balance;Decreased mobility;Decreased knowledge of precautions;Decreased safety awareness;Decreased knowledge of use  of DME;Pain       PT Treatment Interventions DME instruction;Gait training;Functional mobility training;Therapeutic activities;Therapeutic exercise;Patient/family education    PT Goals (Current goals can be found in the Care Plan section)  Acute Rehab PT Goals Patient Stated Goal: to get up and walk PT Goal Formulation: With patient Time For Goal Achievement: 05/23/17 Potential to Achieve Goals: Fair    Frequency Min 3X/week   Barriers to discharge Decreased caregiver support      Co-evaluation               AM-PAC PT "6 Clicks" Daily Activity  Outcome Measure Difficulty turning over in bed (including adjusting bedclothes, sheets and blankets)?: Total Difficulty moving from lying on back to  sitting on the side of the bed? : Total Difficulty sitting down on and standing up from a chair with arms (e.g., wheelchair, bedside commode, etc,.)?: Total Help needed moving to and from a bed to chair (including a wheelchair)?: Total Help needed walking in hospital room?: Total Help needed climbing 3-5 steps with a railing? : Total 6 Click Score: 6    End of Session Equipment Utilized During Treatment: Gait belt Activity Tolerance: Patient tolerated treatment well Patient left: in bed;with call bell/phone within reach;with family/visitor present;with bed alarm set Nurse Communication: Mobility status PT Visit Diagnosis: Difficulty in walking, not elsewhere classified (R26.2);History of falling (Z91.81)    Time: 6789-3810 PT Time Calculation (min) (ACUTE ONLY): 21 min   Charges:   PT Evaluation $PT Eval Moderate Complexity: 1 Procedure     PT G Codes:          [name] 05/09/2017, 6:21 PM

## 2017-05-09 NOTE — Progress Notes (Addendum)
PROGRESS NOTE    Victor Wang  RAQ:[id number] DOB: March 12, 1935 DOA: 05/04/2017 PCP: Townsend Roger, MD   Brief Narrative:  Victor Wang is a 82 y.o. male with medical history significant for chronic CHF and recurrent urothelial carcinoma status post right nephroureterectomy in March 2018, now presenting to the emergency department with increasing weakness and falls, progressive lower extremity edema, and shortness of breath. Patient has been noted to grow increasingly weak, generally, over the past couple weeks, but much more acutely since 04/29/2017. He has had multiple falls since that time, but none with head injury or loss of consciousness. He reports weakness involving all of his extremities, but with legs more so than arms, and perhaps the right leg more so than the left. He denies any numbness, denies headache, and denies change in vision or hearing. His Lasix had been held for the last several days due to increasing weakness, but he has since developed progressive lower extremity edema and shortness of breath. Also concerning is an enlarging nodule under the right eye, first noted a couple weeks ago and growing. Patient reports that it is tender to palpation, but otherwise does not bother him.     Noncontrast head CT was negative for acute intracranial abnormality, but maxillofacial CT demonstrates a 3.2 cm mass at the junction of the right anterolateral maxillary sinus and anterior aspect of the zygomatic arch with mild periostitis and concerning for possible primary bone tumor or a metastasis. Neurology was consulted by the ED physician and has evaluated the patient in the emergency department. Patient remained hemodynamically stable and has not been in acute respiratory distress. He was  admitted to the telemetry unit for ongoing evaluation and management of acute on chronic CHF and generalized weakness, with increased weakness involving the right leg in the presence of new worsening  back pain concerning for metastatic disease. CT Maxillofacial also showed small metallic densities in the Right and left Superficial Soft tissues. He underwent Eye X-Ray which revealed that he had Metallic foci imbedded in the skin of the nose, medial right orbit, and central left orbit. Patient states that they have been there for a while now. MRI of the Thoracic and Lumbar Spine was ordered and could not be done at [facility] as patient was too big to fit in MRI machine so will be transferred to [facility] to have it done and brought back. Patient also underwent and ECHOCardiogram 05/05/17. Patient complained of Constipation 05/06/17 so was given a bisacodyl suppository and Increased Bowel Regimen.   MRI was done 05/06/10 at [facility] and shows metastatic lesions to the thoracic and lumbar spine. Consulted Oncology Dr. Benay Spice for further recommendations and recommended ordering CT of the Chest/Abdomen/Pelvis for Staging. Patient underwent CT Guided Biopsy of T6 Paraspinal Mass yesterday. Biopsy confirmed that morphology is consistent with the patient's urothelial carcinoma. Patient to undergo CT Chest/Abd/Pelvis for staging today and IV Decadron will be discontinued. PT needs to see the patient and likely will need to go to SNF for strengthening. Dr. Benay Spice to discuss plan of care and possible trial of systemic therapy vs. Hospice.   Assessment & Plan:   Principal Problem:   Right leg weakness Active Problems:   Bladder cancer (HCC)   Acute CHF (HCC)   Essential hypertension   Cancer of renal pelvis, right (HCC)   Facial mass   Leg weakness   Pressure injury of skin   Iron deficiency anemia   GERD (gastroesophageal reflux disease)  Hyperlipidemia   Hypercalcemia   Hypomagnesemia  Generalized Weakness and Deconditioning 2/2 to Metastaic Disease - Pt presents with generalized weakness, progressing in recent weeks, most notable in bilateral legs and leading to falls  - Head CT is negative  for acute intracranial abnormality - Neurology has evaluated the pt in ED and their consultation much appreciated; Neurology signed off as they he has a T6 Radiculopathy likley from Metastasis -Neurology feels as if falls are multifactorial with generalized weakness and pain as significant factors; Feel as MRI C-Spine would be of low yield regarding spinal cord pathology - MRI lumbar and thoracic spine advised for suspected metastatic disease; Concern as Alk Phos was also elevated (344) - MRI T- and L-spine showed multiple metastatic lesions to the thoracic and lumbar spine as well as a right Paraspinous soft tissue mass at the T6-7 Level with extension into the T6 and T7 Pedicles and T6-7 Foramen. There were also retrocrural and paraaortic adenopathy compatible with metastatic disease as well as a a 10 cm aortocaval soft tissue mass compatible with metastatic disease. MRI also noted remote fractures at T12, L3, and L4 and the largest Bone metastases are at L5, T10, and the Right Greater than Left sacroiliac joints.  - PT to Evaluate however fatigue caused patient to limit his ablilty to participate  - Pain control with Fentanyl 25-50 mcg IV q2hprn and C/w Norco 1 tab po q4hprn  - Discussed with Dr. Benay Spice of Oncology and feels as if this is metastatic urothelial carcinoma and recommended Trial of Decadron 10 mg IV q12h for Right Leg weakness as it may be related to peripheral nerve compression from the metastatic tumor. -Trial of IV Decadron discontinued by Oncology Dr. Benay Spice  -IR Consulted and biopsied Paraspinal Mass at T6; Sq Lovenox  resumed at 22:00 yesterday -Dr Learta Codding recommending CT of Chest/Abdomen/Pelvis for Staging and will get that done today -Biopsy results showed morphology is consistent with the patient's urothelial carcinoma  -PT/OT To evaluate and treat and will likely need SNF  Acute on Chronic Diastolic CHF  - Pt presents with progressive BLE edema and DOE, found to have  crackles, elevated BNP, interstitial edema on CXR  - No ECHO was on file  - BNP was 113.6 - He is managed at home with Lasix 40 mg BID and metoprolol  - Plan to SLIV, follow daily wts and strict I/O's,  -Continue metoprolol as tolerated,  -Changed IV diuresis with Lasix 40 mg IV q12h to 40 mg IV Daily- Echocardiogram ordered and showed that it was difficult to seen endocardium but wall thickness was increased in a pattern of severe LVH but normal Systolic Fxc and an estimated EF of 50-55% with G1DD.  -Patient is -5.413 L; Patient's Weight is down 7 lbs  Facial mass  - Pt presents with firm nodule inferior to right eye, growing over the past 2 wks  - CT maxillofacial suggests a primary bone tumor vs metastatic disease  - MRI done and it is likely metastatic disease given findings on L and T spine MRI; Suspect it is Urothelial Cancer   Hypertension  - BP is at goal and actually on the lower side 104/57 - Continue Metoprolol 50 mg po Daily as tolerated   Urothelial carcinoma which was recurrent and Renal Cancer and Metastatic Disease  - Status-post right nephroureterectomy in March '18, underwent TURBT in early May '18  - Follows with Dr. Tresa Moore of Urology  - Patient has metastatic disease as above but will  await biopsy results to confirm if it is Urothelial Carcinoma or not -CT of Chest/Abdomen/Pelvis being done for Staging today  -PT to evaluate and Treat; Patient has poor functional status -Possible Systemic Therapy vs. Hospice depending on progression now that diagnosis is confirmed  -Dr. Benay Spice to likely trasnfer care to Dr. Myrene Buddy at [place] if patient is candidate for Chemotherapy   Iron-deficiency Anemia  - Hgb is stable at 9.1 on admission and no bleeding is evident; -Hb/Hct went from 9.0/28.4 -> 8.7/27.4 -> 8.7/27.0 -> 9.1/28.2 - Continue iron-supplementation with Ferrous Sulfate 325 mg po BID -Repeat CBC in AM  GERD -C/w Pantoprazole 40 mg po  Daily  Hyperlipidemia -C/w Pravastatin 80 mg po Daily  Hypomagnesemia -Patient's Mag Level was 2.0 -Continue to Monitor and Replete Mag as Necessary -Repeat Mag Level in AM  Mild Hypercalcemia likely related to Hypercalcemia of Malignancy -Was mildly elevated and went from 10.4 -> 10.2 -> 10.0 -> 10.2 -> 10.5 -Ionized Calcium being Checked by Oncology and was 5.9 -Dr. Benay Spice ordering IV Pamidronate for treatment of Hypercalcemia  -Repeat CMP in AM   Pressure Ulcer on Sacrum  -Mild and poA -WOC Nurse evaluated and left Recc's and appreciate them -Per [facility] nurse turn and reposition patient from side to side-avoiding the supine position.  Additionally, we will provide Pressure Redistribution heel boots to prevent pressure injury while in bed.  A pressure redistribution chair cushion is ordered for use while OOB in chair and post discharge.  Constipation, improved -Scheduled Miralax BID and added Senna Docusate 1 tab po BID -Gave Bisacodyl Suppository 10 mg once.  -Continue to Monitor   DVT prophylaxis: Enoxaparin 40 mg sq q24h  Code Status: FULL CODE Family Communication: Discussed with Son and Daughter at Bedside Disposition Plan: SNF at Discharge  Consultants:   Neurology Dr. Leonel Ramsay  Oncology Dr. Benay Spice  Interventional Radiology   Procedures:  ECHOCARDIOGRAM Study Conclusions  - Procedure narrative: Transthoracic echocardiography. Image   quality was suboptimal. The study was technically difficult, as a   result of poor acoustic windows, poor sound wave transmission,   restricted patient mobility, and body habitus. Intravenous   contrast (Definity) was administered. - Left ventricle: Ver poor image quality even with definity   Difficult to see endocardium Consider cardiac MRI if clinically   indicated to more acurately asses EF. Wall thickness was   increased in a pattern of severe LVH. Systolic function was   normal. The estimated ejection fraction was in  the range of 50%   to 55%. Doppler parameters are consistent with abnormal left   ventricular relaxation (grade 1 diastolic dysfunction). - Atrial septum: No defect or patent foramen ovale was identified.  Antimicrobials:  Anti-infectives    None     Subjective: Seen and examined and stated he was feeling tired. States he had a biopsy yesterday and there was blood on the sheets after. No nausea or vomiting. States he didn't want to lay down for another test but then agreed to get CT Chest/Abd/Pelvis done. No other concerns or complaints and answered patient's family's questions to their satisfaction.  Objective: Vitals:   05/09/17 0500 05/09/17 0518 05/09/17 1252 05/09/17 1418  BP:  114/72 122/67   Pulse:  93 77   Resp:  16 18   Temp:  97.8 F (36.6 C) 97.5 F (36.4 C)   TempSrc:  Oral Oral   SpO2:  94% 96% 96%  Weight: 126.6 kg (279 lb 1.6 oz) 126.6 kg (279 lb 1.6 oz)  Height:        Intake/Output Summary (Last 24 hours) at 05/09/17 1529 Last data filed at 05/09/17 1248  Gross per 24 hour  Intake              270 ml  Output             1600 ml  Net            -1330 ml   Filed Weights   05/08/17 0448 05/09/17 0500 05/09/17 0518  Weight: 128 kg (282 lb 3 oz) 126.6 kg (279 lb 1.6 oz) 126.6 kg (279 lb 1.6 oz)   Examination: Physical Exam:  Constitutional: Pleasant 82 yo obese Caucasian male in NAD but appears to be a little more confused this AM.  Eyes: Sclerae anicteric; Had right facial lesion under eye ENMT: Grossly normal hearing. External ears appear normal Neck: Supple with visible JVD Respiratory: Clear to Auscultation Bilaterally. Patient was not tachypenic but was wearing supplemental O2. No wheezing/rales/rhonchi.  Cardiovascular: RRR; S1 S2. Trace lower extremity edema Abdomen: Soft, mildly tender to palpate. Distended due to body habitus. Bowel sounds present GU: Deferred. Condom catheter on Musculoskeletal: No cyanosis. No contractures Skin: Warm and  dry. Has right facial lesion under eye. No rashes appreciated Neurologic: CN 2-12 grossly intact. Had some Right leg weakness still Psychiatric: Flat affect and seems a little depressed. Awake but not oriented and a little more confused today  Data Reviewed: I have personally reviewed following labs and imaging studies  CBC:  Recent Labs Lab 05/04/17 1859 05/06/17 0533 05/07/17 0507 05/08/17 0507 05/09/17 0524  WBC 7.2 8.4 9.9 4.3 7.8  NEUTROABS 6.0 6.6 8.3* 3.9 7.1  HGB 9.1* 9.0* 8.7* 8.7* 9.1*  HCT 28.8* 28.4* 27.4* 27.0* 28.2*  MCV 93.2 92.8 91.6 91.8 91.3  PLT 197 196 208 180 891   Basic Metabolic Panel:  Recent Labs Lab 05/05/17 0530 05/06/17 0533 05/07/17 0507 05/08/17 0507 05/09/17 0524  NA 137 135 135 132* 132*  K 4.0 3.8 3.9 4.0 4.2  CL 99* 95* 94* 94* 91*  CO2 29 31 29 29 31   GLUCOSE 114* 112* 95 205* 146*  BUN 23* 27* 30* 36* 44*  CREATININE 0.98 1.07 1.08 0.99 0.97  CALCIUM 10.4* 10.2 10.0 10.2 10.5*  MG  --  1.7 1.6* 1.9 2.0  PHOS  --  3.8 3.5 3.2 2.5   GFR: Estimated Creatinine Clearance: 84.9 mL/min (by C-G formula based on SCr of 0.97 mg/dL). Liver Function Tests:  Recent Labs Lab 05/04/17 1859 05/06/17 0533 05/07/17 0507 05/08/17 0507 05/09/17 0524  AST 39 38 34 37 45*  ALT 36 32 29 28 33  ALKPHOS 359* 366* 326* 320* 344*  BILITOT 1.1 1.0 1.2 0.9 0.9  PROT 6.4* 6.1* 5.9* 6.1* 6.3*  ALBUMIN 2.8* 2.6* 2.5* 2.4* 2.6*   No results for input(s): LIPASE, AMYLASE in the last 168 hours. No results for input(s): AMMONIA in the last 168 hours. Coagulation Profile:  Recent Labs Lab 05/04/17 1859  INR 1.29   Cardiac Enzymes:  Recent Labs Lab 05/04/17 1859  TROPONINI <0.03   BNP (last 3 results) No results for input(s): PROBNP in the last 8760 hours. HbA1C: No results for input(s): HGBA1C in the last 72 hours. CBG:  Recent Labs Lab 05/08/17 1152 05/08/17 1713 05/08/17 2108 05/09/17 0737 05/09/17 1215  GLUCAP 178* 243* 176*  143* 162*   Lipid Profile: No results for input(s): CHOL, HDL, LDLCALC, TRIG, CHOLHDL, LDLDIRECT in the last  72 hours. Thyroid Function Tests: No results for input(s): TSH, T4TOTAL, FREET4, T3FREE, THYROIDAB in the last 72 hours. Anemia Panel: No results for input(s): VITAMINB12, FOLATE, FERRITIN, TIBC, IRON, RETICCTPCT in the last 72 hours. Sepsis Labs: No results for input(s): PROCALCITON, LATICACIDVEN in the last 168 hours.  No results found for this or any previous visit (from the past 240 hour(s)).   Radiology Studies: Ct Biopsy  Result Date: 05-15-17 INDICATION: 82 year old male with a history of urothelial cell carcinoma. Now with multiple lesions on CT concerning for metastases. EXAM: CT BIOPSY MEDICATIONS: None. ANESTHESIA/SEDATION: Moderate (conscious) sedation was employed during this procedure. A total of Versed 2.0 mg and Fentanyl 100 mcg was administered intravenously. Moderate Sedation Time: 10 minutes. The patient's level of consciousness and vital signs were monitored continuously by radiology nursing throughout the procedure under my direct supervision. FLUOROSCOPY TIME:  CT COMPLICATIONS: None PROCEDURE: Informed written consent was obtained from the patient after a thorough discussion of the procedural risks, benefits and alternatives. All questions were addressed. Maximal Sterile Barrier Technique was utilized including caps, mask, sterile gowns, sterile gloves, sterile drape, hand hygiene and skin antiseptic. A timeout was performed prior to the initiation of the procedure. Patient is positioned left decubitus position on the CT gantry table. Scout CT of the thoracic region performed for planning purposes. The patient is then prepped and draped in the usual sterile fashion and the skin and subcutaneous tissues were generously infiltrated with 1% lidocaine for local anesthesia. Using CT guidance, 17 gauge guide needle was advanced into right paraspinal mass involving the head  of the right sixth rib. Multiple 18 gauge core biopsy were performed. Samples placed into formalin. Needle was removed and sterile bandage was placed. Patient tolerated the procedure well and remained hemodynamically stable throughout. No complications were encountered and no significant blood loss. FINDINGS: Scout CT demonstrates soft tissue mass involving the head of the right sixth rib, paraspinal location, seen previously on MRI. Evidence of additional bony metastases involving the sternum/manubrium, as well as the left second rib, fifth rib, with pulmonary nodules bilaterally concerning for additional metastases. Images during the case demonstrate needle placement within the lesion and the right sixth rib/paraspinal location. IMPRESSION: Status post CT-guided biopsy of right paraspinal lesion at the level of T6. Tissue specimen sent to pathology for complete histopathologic analysis. The scout CT images demonstrate nodule of bilateral lungs concerning for metastases, as well additional site of metastasis involving the left second and fifth ribs. PET-CT may be useful for further evaluation. Signed, Dulcy Fanny. Earleen Newport, DO [facility] Radiology Electronically Signed   By: Corrie Mckusick D.O.   On: 15-May-2017 11:42   Scheduled Meds: . iopamidol      . allopurinol  100 mg Oral Daily  . chlorhexidine  15 mL Mouth Rinse BID  . dextromethorphan-guaiFENesin  1 tablet Oral BID  . enoxaparin (LOVENOX) injection  40 mg Subcutaneous Q24H  . ferrous sulfate  325 mg Oral BID WC  . [START ON 05/10/2017] furosemide  40 mg Intravenous Daily  . insulin aspart  0-5 Units Subcutaneous QHS  . insulin aspart  0-9 Units Subcutaneous TID WC  . mouth rinse  15 mL Mouth Rinse q12n4p  . metoprolol tartrate  50 mg Oral Daily  . pantoprazole  40 mg Oral BID  . polyethylene glycol  17 g Oral BID  . pravastatin  80 mg Oral q1800  . senna-docusate  1 tablet Oral BID  . sodium  chloride  flush  3 mL Intravenous Q12H   Continuous Infusions: . sodium chloride    . zoledronic acid (ZOMETA) IV      LOS: 5 days   Kerney Elbe, DO [facility] Pager [phone]  If 7PM-7AM, please contact night-coverage [email or web address] Password [email or web address] 05/09/2017, 3:29 PM

## 2017-05-09 NOTE — Plan of Care (Signed)
Problem: Safety: Goal: Ability to remain free from injury will improve Outcome: Progressing Family in room, bed alarm placed.

## 2017-05-09 NOTE — Progress Notes (Signed)
IP PROGRESS NOTE  Subjective:   He does not remember undergoing the biopsy procedure yesterday. His son and daughter are at the bedside.  Objective: Vital signs in last 24 hours: Blood pressure 122/67, pulse 77, temperature 97.5 F (36.4 C), temperature source Oral, resp. rate 18, height 6' (1.829 m), weight 279 lb 1.6 oz (126.6 kg), SpO2 96 %.  Intake/Output from previous day: 06/11 0701 - 06/12 0700 In: 510 [P.O.:510] Out: 1250 [Urine:1250]  Physical Exam:  Cardiac: Regular rate and rhythm Lungs: Clear bilaterally Abdomen: Mild tenderness at the right costal margin Extremities: No leg edema Neurologic: decreased strength with flexion at the right hip   Lab Results:  Recent Labs  05/08/17 0507 05/09/17 0524  WBC 4.3 7.8  HGB 8.7* 9.1*  HCT 27.0* 28.2*  PLT 180 223    BMET  Recent Labs  05/08/17 0507 05/09/17 0524  NA 132* 132*  K 4.0 4.2  CL 94* 91*  CO2 29 31  GLUCOSE 205* 146*  BUN 36* 44*  CREATININE 0.99 0.97  CALCIUM 10.2 10.5*    Studies/Results: Ct Biopsy  Result Date: 05/08/2017 INDICATION: 82 year old male with a history of urothelial cell carcinoma. Now with multiple lesions on CT concerning for metastases. EXAM: CT BIOPSY MEDICATIONS: None. ANESTHESIA/SEDATION: Moderate (conscious) sedation was employed during this procedure. A total of Versed 2.0 mg and Fentanyl 100 mcg was administered intravenously. Moderate Sedation Time: 10 minutes. The patient's level of consciousness and vital signs were monitored continuously by radiology nursing throughout the procedure under my direct supervision. FLUOROSCOPY TIME:  CT COMPLICATIONS: None PROCEDURE: Informed written consent was obtained from the patient after a thorough discussion of the procedural risks, benefits and alternatives. All questions were addressed. Maximal Sterile Barrier Technique was utilized including caps, mask, sterile gowns, sterile gloves, sterile drape, hand hygiene and skin  antiseptic. A timeout was performed prior to the initiation of the procedure. Patient is positioned left decubitus position on the CT gantry table. Scout CT of the thoracic region performed for planning purposes. The patient is then prepped and draped in the usual sterile fashion and the skin and subcutaneous tissues were generously infiltrated with 1% lidocaine for local anesthesia. Using CT guidance, 17 gauge guide needle was advanced into right paraspinal mass involving the head of the right sixth rib. Multiple 18 gauge core biopsy were performed. Samples placed into formalin. Needle was removed and sterile bandage was placed. Patient tolerated the procedure well and remained hemodynamically stable throughout. No complications were encountered and no significant blood loss. FINDINGS: Scout CT demonstrates soft tissue mass involving the head of the right sixth rib, paraspinal location, seen previously on MRI. Evidence of additional bony metastases involving the sternum/manubrium, as well as the left second rib, fifth rib, with pulmonary nodules bilaterally concerning for additional metastases. Images during the case demonstrate needle placement within the lesion and the right sixth rib/paraspinal location. IMPRESSION: Status post CT-guided biopsy of right paraspinal lesion at the level of T6. Tissue specimen sent to pathology for complete histopathologic analysis. The scout CT images demonstrate nodule of bilateral lungs concerning for metastases, as well additional site of metastasis involving the left second and fifth ribs. PET-CT may be useful for further evaluation. Signed, Dulcy Fanny. Earleen Newport, DO [facility] Electronically Signed   By: Corrie Mckusick D.O.   On: 05/08/2017 11:42    Medications: I have reviewed the patient's current medications.  Assessment/Plan:  1. Urothelial carcinoma, initially diagnosed in 2015 with noninvasive  urothelial  carcinoma the bladder, status post resection followed by BCG therapy  Multiple recurrences of noninvasive bladder carcinoma, last underwent resection in 2016  Right renal pelvis tumor December 2017, biopsy January 2018 confirmed high-grade urothelial carcinoma  Right nephroureterectomy March 2018 confirmed a high-grade invasive urothelial carcinoma of the right kidney, T4, with lymphovascular invasion  Gross hematuria May 2018-status post removal of retained clot and resection of urothelial carcinoma at the prostate  CT head 05/04/2017 confirmed a mass at the junction of the right maxillary sinus and inferior aspect of the zygomatic arch  MRI thoracic/lumbar spine 05/06/2017 confirmed multiple metastatic lesions, a right paraspinous soft tissue mass at T6-T7, retrocrural and periaortic adenopathy, 10 cm aortocaval mass  CT biopsy of right T6 paraspinal mass 05/08/2017, lung/rib/sternal metastases seen  2. Back/chest pain-likely secondary to bone metastases  3. Right leg weakness-potentially secondary to nerve root compression for metastatic urothelial carcinoma, though no cord compression is noted on the MRI 05/06/2017  4.  Congestive heart failure  5.  Anemia  6.  Cirrhosis  7.  Hypercalcemia-Zometa given 05/09/2017   Mr. Gruenberg appears to have metastatic urothelial cancer involving multiple sites. He has hypercalcemia of malignancy. I discussed the situation with Mr. Willcox and his family. He plans to pursue nursing home placement closer to home. We discussed supportive care with Hospice versus a trial of systemic therapy. He has a poor performance status and he may not be a candidate for systemic therapy. We discussed  immunotherapy and chemotherapy. The hypercalcemia will be treated with biphosphonate therapy. We reviewed the potential risks associated with biphosphonate's and he agrees to proceed. Decadron has not helped the right leg weakness.   Recommendations: 1.  Zometa for treatment of hypercalcemia and bone strengthening in the setting of multiple metastases 2. Staging CTs of the chest, abdomen, and pelvis to assess the tumor burden and provide baseline data if he is treated with systemic therapy 3. Physical therapy 4. Skilled nursing facility placement 5. Discontinue Decadron   LOS: 5 days   Donneta Romberg, MD   05/09/2017, 2:18 PM

## 2017-05-10 ENCOUNTER — Telehealth: Payer: Self-pay | Admitting: *Deleted

## 2017-05-10 DIAGNOSIS — I10 Essential (primary) hypertension: Secondary | ICD-10-CM | POA: Diagnosis not present

## 2017-05-10 DIAGNOSIS — I5040 Unspecified combined systolic (congestive) and diastolic (congestive) heart failure: Secondary | ICD-10-CM | POA: Diagnosis not present

## 2017-05-10 DIAGNOSIS — J989 Respiratory disorder, unspecified: Secondary | ICD-10-CM | POA: Diagnosis not present

## 2017-05-10 DIAGNOSIS — C799 Secondary malignant neoplasm of unspecified site: Secondary | ICD-10-CM

## 2017-05-10 DIAGNOSIS — D509 Iron deficiency anemia, unspecified: Secondary | ICD-10-CM

## 2017-05-10 DIAGNOSIS — C679 Malignant neoplasm of bladder, unspecified: Secondary | ICD-10-CM | POA: Diagnosis not present

## 2017-05-10 DIAGNOSIS — C3490 Malignant neoplasm of unspecified part of unspecified bronchus or lung: Secondary | ICD-10-CM | POA: Diagnosis not present

## 2017-05-10 DIAGNOSIS — R29898 Other symptoms and signs involving the musculoskeletal system: Secondary | ICD-10-CM

## 2017-05-10 DIAGNOSIS — C651 Malignant neoplasm of right renal pelvis: Secondary | ICD-10-CM | POA: Diagnosis not present

## 2017-05-10 DIAGNOSIS — E785 Hyperlipidemia, unspecified: Secondary | ICD-10-CM | POA: Diagnosis not present

## 2017-05-10 DIAGNOSIS — C7951 Secondary malignant neoplasm of bone: Principal | ICD-10-CM

## 2017-05-10 DIAGNOSIS — R52 Pain, unspecified: Secondary | ICD-10-CM | POA: Diagnosis not present

## 2017-05-10 DIAGNOSIS — I5031 Acute diastolic (congestive) heart failure: Secondary | ICD-10-CM

## 2017-05-10 DIAGNOSIS — J449 Chronic obstructive pulmonary disease, unspecified: Secondary | ICD-10-CM | POA: Diagnosis not present

## 2017-05-10 DIAGNOSIS — K219 Gastro-esophageal reflux disease without esophagitis: Secondary | ICD-10-CM | POA: Diagnosis not present

## 2017-05-10 DIAGNOSIS — R109 Unspecified abdominal pain: Secondary | ICD-10-CM | POA: Diagnosis not present

## 2017-05-10 DIAGNOSIS — C7800 Secondary malignant neoplasm of unspecified lung: Secondary | ICD-10-CM | POA: Diagnosis not present

## 2017-05-10 DIAGNOSIS — Z79899 Other long term (current) drug therapy: Secondary | ICD-10-CM | POA: Diagnosis not present

## 2017-05-10 DIAGNOSIS — N289 Disorder of kidney and ureter, unspecified: Secondary | ICD-10-CM | POA: Diagnosis not present

## 2017-05-10 DIAGNOSIS — C229 Malignant neoplasm of liver, not specified as primary or secondary: Secondary | ICD-10-CM | POA: Diagnosis not present

## 2017-05-10 DIAGNOSIS — R54 Age-related physical debility: Secondary | ICD-10-CM | POA: Diagnosis not present

## 2017-05-10 DIAGNOSIS — I509 Heart failure, unspecified: Secondary | ICD-10-CM | POA: Diagnosis not present

## 2017-05-10 DIAGNOSIS — M6281 Muscle weakness (generalized): Secondary | ICD-10-CM | POA: Diagnosis not present

## 2017-05-10 DIAGNOSIS — Z9181 History of falling: Secondary | ICD-10-CM | POA: Diagnosis not present

## 2017-05-10 DIAGNOSIS — N183 Chronic kidney disease, stage 3 (moderate): Secondary | ICD-10-CM | POA: Diagnosis not present

## 2017-05-10 DIAGNOSIS — C641 Malignant neoplasm of right kidney, except renal pelvis: Secondary | ICD-10-CM | POA: Diagnosis not present

## 2017-05-10 DIAGNOSIS — D649 Anemia, unspecified: Secondary | ICD-10-CM | POA: Diagnosis not present

## 2017-05-10 DIAGNOSIS — C801 Malignant (primary) neoplasm, unspecified: Secondary | ICD-10-CM | POA: Diagnosis not present

## 2017-05-10 LAB — CBC WITH DIFFERENTIAL/PLATELET
Basophils Absolute: 0 K/uL (ref 0.0–0.1)
Basophils Relative: 0 %
Eosinophils Absolute: 0 K/uL (ref 0.0–0.7)
Eosinophils Relative: 0 %
HCT: 27.7 % — ABNORMAL LOW (ref 39.0–52.0)
Hemoglobin: 9 g/dL — ABNORMAL LOW (ref 13.0–17.0)
Lymphocytes Relative: 11 %
Lymphs Abs: 0.7 K/uL (ref 0.7–4.0)
MCH: 29.3 pg (ref 26.0–34.0)
MCHC: 32.5 g/dL (ref 30.0–36.0)
MCV: 90.2 fL (ref 78.0–100.0)
Monocytes Absolute: 0.2 K/uL (ref 0.1–1.0)
Monocytes Relative: 3 %
Neutro Abs: 5.5 K/uL (ref 1.7–7.7)
Neutrophils Relative %: 86 %
Platelets: 195 K/uL (ref 150–400)
RBC: 3.07 MIL/uL — ABNORMAL LOW (ref 4.22–5.81)
RDW: 14.5 % (ref 11.5–15.5)
WBC: 6.4 K/uL (ref 4.0–10.5)

## 2017-05-10 LAB — COMPREHENSIVE METABOLIC PANEL WITH GFR
ALT: 37 U/L (ref 17–63)
AST: 53 U/L — ABNORMAL HIGH (ref 15–41)
Albumin: 2.4 g/dL — ABNORMAL LOW (ref 3.5–5.0)
Alkaline Phosphatase: 346 U/L — ABNORMAL HIGH (ref 38–126)
Anion gap: 10 (ref 5–15)
BUN: 46 mg/dL — ABNORMAL HIGH (ref 6–20)
CO2: 31 mmol/L (ref 22–32)
Calcium: 10.5 mg/dL — ABNORMAL HIGH (ref 8.9–10.3)
Chloride: 92 mmol/L — ABNORMAL LOW (ref 101–111)
Creatinine, Ser: 1 mg/dL (ref 0.61–1.24)
GFR calc Af Amer: >60 mL/min
GFR calc non Af Amer: >60 mL/min
Glucose, Bld: 136 mg/dL — ABNORMAL HIGH (ref 65–99)
Potassium: 4.2 mmol/L (ref 3.5–5.1)
Sodium: 133 mmol/L — ABNORMAL LOW (ref 135–145)
Total Bilirubin: 1 mg/dL (ref 0.3–1.2)
Total Protein: 5.6 g/dL — ABNORMAL LOW (ref 6.5–8.1)

## 2017-05-10 LAB — MAGNESIUM: MAGNESIUM: 2 mg/dL (ref 1.7–2.4)

## 2017-05-10 LAB — PHOSPHORUS: PHOSPHORUS: 2.6 mg/dL (ref 2.5–4.6)

## 2017-05-10 LAB — GLUCOSE, CAPILLARY
Glucose-Capillary: 112 mg/dL — ABNORMAL HIGH (ref 65–99)
Glucose-Capillary: 156 mg/dL — ABNORMAL HIGH (ref 65–99)

## 2017-05-10 MED ORDER — OXYCODONE HCL ER 10 MG PO T12A: 10.0000 mg | EXTENDED_RELEASE_TABLET | Freq: Two times a day (BID) | ORAL | 0 refills | Status: AC

## 2017-05-10 MED ORDER — SENNOSIDES-DOCUSATE SODIUM 8.6-50 MG PO TABS: 1.0000 | ORAL_TABLET | Freq: Two times a day (BID) | ORAL | Status: AC

## 2017-05-10 MED ORDER — FERROUS SULFATE 325 (65 FE) MG PO TABS: 325.0000 mg | ORAL_TABLET | Freq: Three times a day (TID) | ORAL | 0 refills | Status: AC

## 2017-05-10 MED ORDER — FUROSEMIDE 40 MG PO TABS: 60.0000 mg | ORAL_TABLET | Freq: Two times a day (BID) | ORAL | Status: AC

## 2017-05-10 NOTE — Progress Notes (Addendum)
PT Cancellation Note  Patient Details Name: Victor Wang MRN: [id number] DOB: [birth year]   Cancelled Treatment:    Reason Eval/Treat Not Completed: Fatigue/lethargy limiting ability to participate.patient  Reports feeling very tired.  Will check back another time.   Marcelino Freestone PT [phone]  05/10/2017, 12:51 PM

## 2017-05-10 NOTE — Progress Notes (Signed)
Attempted to call report X2.Facility unable to take report at this time.

## 2017-05-10 NOTE — Telephone Encounter (Signed)
Dr. Benay Spice discussed case with Dr. Hinton Rao at [facility]. Records faxed to Dr. Hinton Rao, per MD request.

## 2017-05-10 NOTE — Discharge Summary (Signed)
Physician Discharge Summary  Victor Wang  ZLD:[id number]  DOB: 1935-05-22  DOA: 05/04/2017 PCP: Victor Roger, MD  Admit date: 05/04/2017 Discharge date: 05/10/2017  Admitted From: Home  Disposition:  SNF   Recommendations for Outpatient Follow-up:  1. Follow up with PCP in 1-2 weeks 2. Please obtain BMP/CBC in one week 3. Follow up with Oncology   Discharge Condition: Improved  CODE STATUS: FULL  Diet recommendation: Heart Healthy  Brief/Interim Summary: Victor Wang a 81 y.o.malewith medical history significant forchronic CHF and recurrent urothelial carcinoma status post right nephroureterectomy in March 2018, now presenting to the emergency department with increasing weakness and falls, progressive lower extremity edema, and shortness of breath. Patient has been noted to grow increasingly weak, generally, over the past couple weeks, but much more acutely since 04/29/2017. He has had multiple falls since that time, but none with head injury or loss of consciousness. He reports weakness involving all of his extremities, but with legs more so than arms, and perhaps the right leg more so than the left. He denies any numbness, denies headache, and denies change in vision or hearing. His Lasix had been held for the last several days due to increasing weakness, but he has since developed progressive lower extremity edema and shortness of breath. Also concerning is an enlarging nodule under the right eye, first noted a couple weeks ago and growing. Patient reports that it is tender to palpation, but otherwise does not bother him.   Noncontrast head CT was negative for acute intracranial abnormality, but maxillofacial CT demonstrates a 3.2 cm mass at the junction of the right anterolateral maxillary sinus and anterior aspect of the zygomatic arch with mild periostitis and concerning for possible primary bone tumor or a metastasis. Neurology was consulted by the ED physician and  has evaluated the patient in the emergency department. Patient remained hemodynamically stable and has not been in acute respiratory distress. He was  admitted to the telemetry unit for ongoing evaluation and management of acute on chronic CHF and generalized weakness, with increased weakness involving the right leg in the presence of new worsening back pain concerning for metastatic disease. CT Maxillofacial also showed small metallic densities in the Right and left Superficial Soft tissues. He underwent Eye X-Ray which revealed that he had Metallic foci imbedded in the skin of the nose, medial right orbit, and central left orbit. Patient states that they have been there for a while now. MRI of the Thoracic and Lumbar Spine was ordered and could not be done at [facility] as patient was too big to fit in MRI machine so will be transferred to [facility] to have it done and brought back. Patient also underwent and ECHOCardiogram 05/05/17. Patient complained of Constipation 05/06/17 so was given a bisacodyl suppository and Increased Bowel Regimen.   MRI was done 05/06/10 at [facility] and shows metastatic lesions to the thoracic and lumbar spine. Consulted Oncology Dr. Benay Wang for further recommendations and recommended ordering CT of the Chest/Abdomen/Pelvis for Staging. Patient underwent CT Guided Biopsy of T6 Paraspinal Mass yesterday. Biopsy confirmed that morphology is consistent with the patient's urothelial carcinoma. Patient had a CT Chest/Abd/Pelvis for staging which resulted on wide spread metastasis. Patient will be discharge to SNF to follow up with oncology and PCP   Subjective: Patient seen and examined on the day of discharge. Son and daughter at bedside. Patient has no complaints. He feels that his breathing is back to his normal. Leg swelling has  resolved. No acute events overnight. Afebrile and tolerating diet well.   Discharge Diagnoses/Hospital Course:  Principal Problem:   Right leg  weakness Active Problems:   Bladder cancer (HCC)   Acute CHF (HCC)   Essential hypertension   Cancer of renal pelvis, right (HCC)   Facial mass   Leg weakness   Pressure injury of skin   Iron deficiency anemia   GERD (gastroesophageal reflux disease)   Hyperlipidemia   Hypercalcemia   Hypomagnesemia  Urothelial carcinoma with wide spread metastasis (lung, bones, liver, lymph nodes) - Pt presents with generalized weakness, progressing in recent weeks, most notable in bilateral legs and leading to falls  - Head CT is negative for acute intracranial abnormality - Neurology has evaluated the pt in ED - Radiculopathy likley from Metastasis - MRI T- and L-spine showed multiple metastatic lesions to the thoracic and lumbar spine as well as a right Paraspinous soft tissue mass at the T6-7 Level with extension into the T6 and T7 Pedicles and T6-7 Foramen. There were also retrocrural and paraaortic adenopathy compatible with metastatic disease as well as a a 10 cm aortocaval soft tissue mass compatible with metastatic disease. MRI also noted remote fractures at T12, L3, and L4 and the largest Bone metastases are at L5, T10, and the Right Greater than Left sacroiliac joints.  - Patient was given a trial of decadron which provided no relief or improvement, so it was d/ced  - IR Consulted and biopsied Paraspinal Mass at T6. Biopsy results showed morphology is consistent with the patient's urothelial carcinoma.  - CT chest/abd/pelvis - showed wide spread metastasis  - PT/OT evaluated and recommended SNF - Patient to follow up with Dr Victor Wang at [facility]   Back/chest pain, R leg weakness  - All due to mets - Nerve compression - Continue pain management as needed - Oxycontin 10 mg q 12 and  Norco 5-325 every 4 PRN   Acute on Chronic Diastolic CHF  - Pt presents with progressive BLE edema and DOE, found to have crackles, elevated BNP, interstitial edema on CXR  - BNP was 113.6 - He is  managed at home with Lasix 40 mg BID and metoprolol  -Echocardiogram ordered and showed that it was difficult to seen endocardium but wall thickness was increased in a pattern of severe LVH but normal Systolic Fxc and an estimated EF of 50-55% with G1DD.  -Patient had good diuresis with IV lasix and transitioned to oral Lasix upon discharge 60 mg BID  -Patient to follow up with PCP as outpatient   Hypertension  - BP is at goal a - Continue Metoprolol 50 mg daily  Iron-deficiency Anemia  - Hgb has remained stable during hospital stay  - Continue iron-supplementation with Ferrous Sulfate 325 mg po BID - Repeat CBC in 1 week   GERD -C/w Pantoprazole 40 mg po Daily  Hyperlipidemia -C/w Pravastatin 80 mg po Daily  Hypomagnesemia - resolved   Hypercalcemia of malignancy  - Corrected Ca+ 11.8 - Ionized Ca+ 5.9 - Treated with IV Pamidronate - Check Ca+ in 1 week   Pressure Ulcer on Sacrum  - POA  - Per WOC nurse turn and reposition patient from side to side-avoiding the supine position. Additionally, we will provide Pressure Redistribution heel boots to prevent pressure injury while in bed. A pressure redistribution chair cushion is ordered for use while OOB in chair and post discharge.  Constipation, improved - Scheduled Miralax BID and  Senna Docusate 1 tab po  BID - Bisacodyl Suppository 10 mg given once   All other chronic medical condition were stable during the hospitalization.  Patient was seen by physical therapy, recommending SNF  On the day of the discharge the patient's vitals were stable, and no other acute medical condition were reported by patient. Patient was felt safe to be discharge to SNF   Discharge Instructions  You were cared for by a hospitalist during your hospital stay. If you have any questions about your discharge medications or the care you received while you were in the hospital after you are discharged, you can call the unit and asked to speak  with the hospitalist on call if the hospitalist that took care of you is not available. Once you are discharged, your primary care physician will handle any further medical issues. Please note that NO REFILLS for any discharge medications will be authorized once you are discharged, as it is imperative that you return to your primary care physician (or establish a relationship with a primary care physician if you do not have one) for your aftercare needs so that they can reassess your need for medications and monitor your lab values.  Discharge Instructions    (HEART FAILURE PATIENTS) Call MD:  Anytime you have any of the following symptoms: 1) 3 pound weight gain in 24 hours or 5 pounds in 1 week 2) shortness of breath, with or without a dry hacking cough 3) swelling in the hands, feet or stomach 4) if you have to sleep on extra pillows at night in order to breathe.    Complete by:  As directed    Call MD for:  difficulty breathing, headache or visual disturbances    Complete by:  As directed    Call MD for:  extreme fatigue    Complete by:  As directed    Call MD for:  hives    Complete by:  As directed    Call MD for:  persistant dizziness or light-headedness    Complete by:  As directed    Call MD for:  persistant nausea and vomiting    Complete by:  As directed    Call MD for:  redness, tenderness, or signs of infection (pain, swelling, redness, odor or green/yellow discharge around incision site)    Complete by:  As directed    Call MD for:  severe uncontrolled pain    Complete by:  As directed    Call MD for:  temperature >100.4    Complete by:  As directed    Diet - low sodium heart healthy    Complete by:  As directed    Increase activity slowly    Complete by:  As directed      Allergies as of 05/10/2017      Reactions   Bee Venom Anaphylaxis   Penicillins Anaphylaxis, Hives, Other (See Comments)   Has patient had a PCN reaction causing immediate rash, facial/tongue/throat  swelling, SOB or lightheadedness with hypotension: Yes Has patient had a PCN reaction causing severe rash involving mucus membranes or skin necrosis: Yes Has patient had a PCN reaction that required hospitalization No Has patient had a PCN reaction occurring within the last 10 years: No If all of the above answers are "NO", then may proceed with Cephalosporin use.   Tramadol Other (See Comments)   Dizzy and loopy.       Medication List    TAKE these medications   acetaminophen 650 MG CR tablet Commonly  known as:  TYLENOL Take 1,300 mg by mouth every 8 (eight) hours as needed for pain.   allopurinol 100 MG tablet Commonly known as:  ZYLOPRIM Take 100 mg by mouth daily.   calcium-vitamin D 500-200 MG-UNIT tablet Commonly known as:  OSCAL WITH D Take 1 tablet by mouth daily with breakfast.   ferrous sulfate 325 (65 FE) MG tablet Take 1 tablet (325 mg total) by mouth 3 (three) times daily with meals. What changed:  when to take this   furosemide 40 MG tablet Commonly known as:  LASIX Take 1.5 tablets (60 mg total) by mouth 2 (two) times daily. What changed:  how much to take   HYDROcodone-acetaminophen 5-325 MG tablet Commonly known as:  NORCO Take 1-2 tablets by mouth every 6 (six) hours as needed for moderate pain or severe pain.   metoprolol tartrate 50 MG tablet Commonly known as:  LOPRESSOR Take 50 mg by mouth daily.   omeprazole 20 MG capsule Commonly known as:  PRILOSEC Take 20 mg by mouth daily.   oxyCODONE 10 mg 12 hr tablet Commonly known as:  OXYCONTIN Take 1 tablet (10 mg total) by mouth every 12 (twelve) hours.   polyethylene glycol packet Commonly known as:  MIRALAX / GLYCOLAX Take 17 g by mouth daily.   potassium chloride SA 20 MEQ tablet Commonly known as:  K-DUR,KLOR-CON Take 20 mEq by mouth daily.   pravastatin 80 MG tablet Commonly known as:  PRAVACHOL Take 80 mg by mouth daily.   senna-docusate 8.6-50 MG tablet Commonly known as:   Senokot-S Take 1 tablet by mouth 2 (two) times daily.       Contact information for follow-up providers    Derwood Kaplan, MD. Schedule an appointment as soon as possible for a visit in 1 week(s).   Specialty:  Oncology Why:  Hospital follow up  Contact information: [address] [place] [address] [phone]            Contact information for after-discharge care    Destination    [facility] Follow up.   Specialty:  [place] information: [address] [place] [phone]                 Allergies  Allergen Reactions  . Bee Venom Anaphylaxis  . Penicillins Anaphylaxis, Hives and Other (See Comments)    Has patient had a PCN reaction causing immediate rash, facial/tongue/throat swelling, SOB or lightheadedness with hypotension: Yes Has patient had a PCN reaction causing severe rash involving mucus membranes or skin necrosis: Yes Has patient had a PCN reaction that required hospitalization No Has patient had a PCN reaction occurring within the last 10 years: No If all of the above answers are "NO", then may proceed with Cephalosporin use.   . Tramadol Other (See Comments)    Dizzy and loopy.     Consultations:  Neurology - Dr Leonel Ramsay    Oncology - Dr Victor Wang   IR    Procedures/Studies: Ct Abdomen Pelvis Wo Contrast  Result Date: 05/09/2017 CLINICAL DATA:  82 year old male with history of urothelial carcinoma and chronic congestive heart failure presenting with increased weakness and multiple falls with progressive lower extremity edema and shortness of breath. EXAM: CT CHEST, ABDOMEN AND PELVIS WITHOUT CONTRAST TECHNIQUE: Multidetector CT imaging of the chest, abdomen and pelvis was performed following the standard protocol without IV contrast. COMPARISON:  CT the abdomen and pelvis 01/20/2017. Chest CT 11/08/2014. FINDINGS:  CT CHEST FINDINGS Cardiovascular:  Heart size is mildly enlarged. There is no significant pericardial fluid, thickening or pericardial calcification. There is aortic atherosclerosis, as well as atherosclerosis of the great vessels of the mediastinum and the coronary arteries, including calcified atherosclerotic plaque in the left main, left anterior descending, left circumflex and right coronary arteries. Severe calcifications of the aortic valve. Calcifications of the mitral annulus. Mediastinum/Nodes: No pathologically enlarged mediastinal or hilar lymph nodes. Please note that accurate exclusion of hilar adenopathy is limited on noncontrast CT scans. Esophagus is unremarkable in appearance. No axillary lymphadenopathy. Lungs/Pleura: Several pulmonary nodules are noted throughout the lungs bilaterally, the largest of which is in the posterior aspect of the left lower lobe (axial image 90 of series 4), concerning for widespread metastatic disease to the lungs. Extensive thickening of the peribronchovascular interstitium in the left lower lobe may reflect developing lymphangitic spread of tumor. Moderate right-sided pleural effusion lying dependently. Extensive passive atelectasis in the right lower lobe. Diffuse bronchial wall thickening with mild to moderate centrilobular and mild paraseptal emphysema. Musculoskeletal: Numerous lytic osseous lesions are noted, compatible with widespread metastatic disease to the bones. The best examples of these include a large lytic lesion in the manubrium, lytic lesion in the posterior aspect of the right seventh rib, lytic lesion in the lateral aspect of the left second rib, and a large expansile lytic lesion in the posterolateral aspect of the left fifth rib. In addition, there multiple old healed bilateral rib fractures. Chronic compression fracture of T12 with 90% loss of anterior vertebral body height is unchanged. CT ABDOMEN PELVIS FINDINGS Hepatobiliary: Innumerable low-attenuation lesions are noted  throughout the hepatic parenchyma, compatible with widespread metastatic disease to the liver. The largest lesion or confluence of lesions is in the right lobe of the liver measuring 12 x 7.7 cm (axial image 52 of series 2). The overall size and number of these hepatic lesions has significantly increased compared to prior study from February 2018. The underlying liver appears cirrhotic. Numerous calcified gallstones are noted within the gallbladder. No findings to suggest an acute cholecystitis at this time. Pancreas: No definite pancreatic mass or peripancreatic inflammatory changes are noted on today's noncontrast CT examination. Spleen: Spleen is enlarged measuring 18.1 x 7.2 x 18.2 cm (estimated splenic volume of 1,186 mL). Adrenals/Urinary Tract: Status post right nephroureterectomy. Large mass in the nephrectomy bed may represent residual tumor, or may simply represent enlarging retroperitoneal lymphadenopathy. This has significantly increased in size compared to the prior study, currently measuring 6.8 x 9.9 cm (axial image 65 of series 2) and is intimately associated with both the inferior vena cava which is displaced anteriorly, and the abdominal aorta. Multiple small left renal lesions range from low to intermediate to high attenuation, incompletely characterized on today's noncontrast CT examination, but likely to represent cysts of varying degrees of complexity. No left hydroureteronephrosis. Unenhanced appearance of the urinary bladder is unremarkable. Stomach/Bowel: Unenhanced appearance of the stomach is normal. There is no pathologic dilatation of small bowel or colon. Numerous colonic diverticulae are noted, without surrounding inflammatory changes to suggest an acute diverticulitis at this time. Vascular/Lymphatic: Aortic atherosclerosis with ectasia and fusiform aneurysmal dilatation of the infrarenal abdominal aorta which measures up to 3.1 cm in diameter. There is also aneurysmal dilatation of  the right common iliac artery which measures up to 2.2 cm in diameter. Portal vein is dilated (19 mm in diameter). Numerous large splenorenal collateral vessels, including a large venous varix adjacent to the splenic hilum measuring  3.5 cm in diameter. Extensive retroperitoneal lymphadenopathy has significantly increased compared to the prior examination. In addition to the large mass in the nephrectomy bed (which may be nodal in origin) several other enlarged retroperitoneal lymph nodes are noted, measuring up to 2.4 cm in short axis in the aortocaval nodal station (axial image 80 of series 2). Reproductive: Prostate gland and seminal vesicles are diminutive. Other: No significant volume of ascites.  No pneumoperitoneum. Musculoskeletal: Lytic lesion in the greater trochanter of the right femur with pathologic fracture (axial image 117 of series 2). Subtle lytic lesion in the left iliac crest (axial image 92 of series 2). Chronic compression fracture of L3 with 50% loss of anterior vertebral body height is unchanged. IMPRESSION: 1. Widespread metastatic disease in the chest, abdomen and pelvis, as detailed above, including multiple pulmonary nodules, right pleural effusion (which is likely malignant), multiple large hepatic metastases, large mass in the nephrectomy bed which may represent residual disease and/or worsening lymphadenopathy, increasing retroperitoneal lymphadenopathy, and widespread metastatic disease to the bones. 2. Stigmata of cirrhosis with portal hypertension, including splenomegaly and large splenorenal collateral vessels, as above. 3. Aortic atherosclerosis, in addition to left main and 3 vessel coronary artery disease. 4. 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 5. Cholelithiasis without evidence of acute cholecystitis at this time. 6. Diffuse bronchial wall thickening with mild to moderate centrilobular and mild paraseptal emphysema;  imaging findings suggestive of underlying COPD. 7. Additional incidental findings, as above. Electronically Signed   By: Vinnie Langton M.D.   On: 05/09/2017 19:56   Dg Eye Foreign Body  Result Date: 05/05/2017 CLINICAL DATA:  Three metallic foci are identified. Correlation with a CT scan from yesterday demonstrates a metallic focus in the skin of the medial right orbit and another in the skin over the left orbit. Again, both appear to be imbedded in the skin. Metallic foci are seen in the nose as well, imbedded in the soft tissues. No other abnormalities. EXAM: ORBITS FOR FOREIGN BODY - 2 VIEW COMPARISON:  None. FINDINGS: Metallic foci imbedded in the skin of the nose, medial right orbit, and central left orbit. I reviewed the maxillofacial CT with a neuroradiologist. It was decided an MRI of the face would not provide enough additional information to justify the MRI of the face. The MRI of the thoracic and lumbar spine can be performed as long as the patient could communicate any pain or discomfort to the MRI technologist. IMPRESSION: Metallic foci imbedded in the skin of the nose, medial right orbit, and central left orbit should be stable. I reviewed the maxillofacial CT with a neuroradiologist. It was decided an MRI of the face would not provide enough additional information to justify the MRI of the face. The MRI of the thoracic and lumbar spine can be performed in this patient with extremity weakness as long as the patient can communicate any pain or discomfort to the MRI technologist. These findings and recommendations were discussed with the MRI technologist. Electronically Signed   By: Dorise Bullion III M.D   On: 05/05/2017 08:58   Dg Nasal Bones  Result Date: 05/04/2017 CLINICAL DATA:  Question nasal metallic foreign body. EXAM: NASAL BONES - 3+ VIEW COMPARISON:  Face CT earlier this day. FINDINGS: There are 2 metallic foreign bodies projecting over the right and left nasal region, which are in  the soft tissues on CT. No nasal bone fracture. IMPRESSION: Tiny metallic densities project over the right  and left nasal region, which corresponds soft tissue foreign bodies on CT. Electronically Signed   By: Jeb Levering M.D.   On: 05/04/2017 22:40   Dg Chest 2 View  Result Date: 05/04/2017 CLINICAL DATA:  Dyspnea cough. EXAM: CHEST  2 VIEW COMPARISON:  01/01/2017 FINDINGS: AP and lateral views of the chest show low volumes with cardiomegaly. There is vascular congestion with diffuse interstitial opacity compatible with edema. Right base atelectasis or infiltrate noted. Small right pleural effusion. The visualized bony structures of the thorax are intact. Telemetry leads overlie the chest. IMPRESSION: Cardiomegaly with interstitial pulmonary edema. Right base atelectasis with small right pleural effusion. Electronically Signed   By: Misty Stanley M.D.   On: 05/04/2017 20:41   Ct Head Wo Contrast  Result Date: 05/04/2017 CLINICAL DATA:  Increased weakness with multiple falls, swelling and bruising to the occipital and right zygomatic arch region EXAM: CT HEAD WITHOUT CONTRAST CT MAXILLOFACIAL WITHOUT CONTRAST TECHNIQUE: Multidetector CT imaging of the head and maxillofacial structures were performed using the standard protocol without intravenous contrast. Multiplanar CT image reconstructions of the maxillofacial structures were also generated. COMPARISON:  None. FINDINGS: CT HEAD FINDINGS Brain: No acute territorial infarction, hemorrhage or intracranial mass is seen. Old encephalomalacia involving the right parietal and temporal lobes consistent with old infarct. Moderate periventricular and subcortical white matter small vessel ischemic changes. Moderate atrophy. Prominent ventricles are felt secondary to atrophy. No midline shift. Vascular: No hyperdense vessels.  Carotid artery calcifications. Skull: No fracture or suspicious bone lesion. Prominent arachnoid granulation in the sub occipital bone on  the right. Other: Partially visualize mass surrounding the lateral aspect of the right maxillary sinus and the anterior aspect of the zygoma. CT MAXILLOFACIAL FINDINGS Osseous: Mandibular heads are normally position. No mandibular fracture. Zygomatic arches and pterygoid plates are intact. No acute nasal bone fracture. Mild periosteal change involving the anterior zygoma and lateral wall of the right maxillary sinus. Orbits: No orbital wall fracture. No intra or extraconal soft tissue abnormality Sinuses: No acute fluid levels. Mild mucosal thickening in the maxillary and ethmoid sinuses. No sinus wall fracture. Soft tissues: Small metallic densities in the right and left nasal superficial soft tissues. Soft tissue mass centered at the junction of the right maxillary sinus and anterior zygoma, this measures 3.2 by 1.8 cm. IMPRESSION: 1. No CT evidence for acute intracranial abnormality. 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 Moderate white matter small vessel ischemic changes 2. No acute facial bone fracture. 3. 3.2 cm mass surrounding the junction of the right anterolateral maxillary sinus and the anterior aspect of the zygomatic arch with underlying mild periostitis. Findings could relate to metastatic lesion or primary bone tumor. Further evaluation with MRI is recommended. 4. Superficial metallic densities in the right and left nasal soft tissues for which clinical correlation is recommended Electronically Signed   By: Donavan Foil M.D.   On: 05/04/2017 21:29   Ct Chest Wo Contrast  Result Date: 05/09/2017 CLINICAL DATA:  82 year old male with history of urothelial carcinoma and chronic congestive heart failure presenting with increased weakness and multiple falls with progressive lower extremity edema and shortness of breath. EXAM: CT CHEST, ABDOMEN AND PELVIS WITHOUT CONTRAST TECHNIQUE: Multidetector CT imaging of the chest, abdomen and pelvis was performed following the standard protocol  without IV contrast. COMPARISON:  CT the abdomen and pelvis 01/20/2017. Chest CT 11/08/2014. FINDINGS: CT CHEST FINDINGS Cardiovascular: Heart size is mildly enlarged. There is no significant pericardial fluid, thickening or pericardial  calcification. There is aortic atherosclerosis, as well as atherosclerosis of the great vessels of the mediastinum and the coronary arteries, including calcified atherosclerotic plaque in the left main, left anterior descending, left circumflex and right coronary arteries. Severe calcifications of the aortic valve. Calcifications of the mitral annulus. Mediastinum/Nodes: No pathologically enlarged mediastinal or hilar lymph nodes. Please note that accurate exclusion of hilar adenopathy is limited on noncontrast CT scans. Esophagus is unremarkable in appearance. No axillary lymphadenopathy. Lungs/Pleura: Several pulmonary nodules are noted throughout the lungs bilaterally, the largest of which is in the posterior aspect of the left lower lobe (axial image 90 of series 4), concerning for widespread metastatic disease to the lungs. Extensive thickening of the peribronchovascular interstitium in the left lower lobe may reflect developing lymphangitic spread of tumor. Moderate right-sided pleural effusion lying dependently. Extensive passive atelectasis in the right lower lobe. Diffuse bronchial wall thickening with mild to moderate centrilobular and mild paraseptal emphysema. Musculoskeletal: Numerous lytic osseous lesions are noted, compatible with widespread metastatic disease to the bones. The best examples of these include a large lytic lesion in the manubrium, lytic lesion in the posterior aspect of the right seventh rib, lytic lesion in the lateral aspect of the left second rib, and a large expansile lytic lesion in the posterolateral aspect of the left fifth rib. In addition, there multiple old healed bilateral rib fractures. Chronic compression fracture of T12 with 90% loss of  anterior vertebral body height is unchanged. CT ABDOMEN PELVIS FINDINGS Hepatobiliary: Innumerable low-attenuation lesions are noted throughout the hepatic parenchyma, compatible with widespread metastatic disease to the liver. The largest lesion or confluence of lesions is in the right lobe of the liver measuring 12 x 7.7 cm (axial image 52 of series 2). The overall size and number of these hepatic lesions has significantly increased compared to prior study from February 2018. The underlying liver appears cirrhotic. Numerous calcified gallstones are noted within the gallbladder. No findings to suggest an acute cholecystitis at this time. Pancreas: No definite pancreatic mass or peripancreatic inflammatory changes are noted on today's noncontrast CT examination. Spleen: Spleen is enlarged measuring 18.1 x 7.2 x 18.2 cm (estimated splenic volume of 1,186 mL). Adrenals/Urinary Tract: Status post right nephroureterectomy. Large mass in the nephrectomy bed may represent residual tumor, or may simply represent enlarging retroperitoneal lymphadenopathy. This has significantly increased in size compared to the prior study, currently measuring 6.8 x 9.9 cm (axial image 65 of series 2) and is intimately associated with both the inferior vena cava which is displaced anteriorly, and the abdominal aorta. Multiple small left renal lesions range from low to intermediate to high attenuation, incompletely characterized on today's noncontrast CT examination, but likely to represent cysts of varying degrees of complexity. No left hydroureteronephrosis. Unenhanced appearance of the urinary bladder is unremarkable. Stomach/Bowel: Unenhanced appearance of the stomach is normal. There is no pathologic dilatation of small bowel or colon. Numerous colonic diverticulae are noted, without surrounding inflammatory changes to suggest an acute diverticulitis at this time. Vascular/Lymphatic: Aortic atherosclerosis with ectasia and fusiform  aneurysmal dilatation of the infrarenal abdominal aorta which measures up to 3.1 cm in diameter. There is also aneurysmal dilatation of the right common iliac artery which measures up to 2.2 cm in diameter. Portal vein is dilated (19 mm in diameter). Numerous large splenorenal collateral vessels, including a large venous varix adjacent to the splenic hilum measuring 3.5 cm in diameter. Extensive retroperitoneal lymphadenopathy has significantly increased compared to the prior examination. In addition to the  large mass in the nephrectomy bed (which may be nodal in origin) several other enlarged retroperitoneal lymph nodes are noted, measuring up to 2.4 cm in short axis in the aortocaval nodal station (axial image 80 of series 2). Reproductive: Prostate gland and seminal vesicles are diminutive. Other: No significant volume of ascites.  No pneumoperitoneum. Musculoskeletal: Lytic lesion in the greater trochanter of the right femur with pathologic fracture (axial image 117 of series 2). Subtle lytic lesion in the left iliac crest (axial image 92 of series 2). Chronic compression fracture of L3 with 50% loss of anterior vertebral body height is unchanged. IMPRESSION: 1. Widespread metastatic disease in the chest, abdomen and pelvis, as detailed above, including multiple pulmonary nodules, right pleural effusion (which is likely malignant), multiple large hepatic metastases, large mass in the nephrectomy bed which may represent residual disease and/or worsening lymphadenopathy, increasing retroperitoneal lymphadenopathy, and widespread metastatic disease to the bones. 2. Stigmata of cirrhosis with portal hypertension, including splenomegaly and large splenorenal collateral vessels, as above. 3. Aortic atherosclerosis, in addition to left main and 3 vessel coronary artery disease. 4. 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 5. Cholelithiasis without evidence of acute  cholecystitis at this time. 6. Diffuse bronchial wall thickening with mild to moderate centrilobular and mild paraseptal emphysema; imaging findings suggestive of underlying COPD. 7. Additional incidental findings, as above. Electronically Signed   By: Vinnie Langton M.D.   On: 05/09/2017 19:56   Mr Thoracic Spine W Wo Contrast  Result Date: 05/06/2017 CLINICAL DATA:  Generalized weakness with progression over the last several weeks. Personal history bladder cancer. Known metastatic disease. EXAM: MRI THORACIC AND LUMBAR SPINE WITHOUT AND WITH CONTRAST TECHNIQUE: Multiplanar and multiecho pulse sequences of the thoracic and lumbar spine were obtained without and with intravenous contrast. CONTRAST:  50mL MULTIHANCE GADOBENATE DIMEGLUMINE 529 MG/ML IV SOLN COMPARISON:  CT of the abdomen and pelvis 01/20/2017. FINDINGS: MRI THORACIC SPINE FINDINGS Alignment: AP alignment is anatomic. Exaggerated kyphosis is present in the lower thoracic spine associated with compression fractures. Rightward curvature of the thoracic spine is centered at T6. Vertebrae: There is diffuse decreased T1 marrow signal. Vertebral body heights are maintained through T11. Enhancing lesion along the superior endplate of J18 measures 13 mm. A 7 mm central enhancing lesion is present at T11. Remote T11 and T12 compression fractures are present without definite enhancement. Tumor is not excluded. Cord:  Normal signal is present throughout the thoracic spinal cord. Paraspinal and other soft tissues: A right paraspinal soft tissue mass is T6-7 measures 5.5 x 5.4 x 2.8 cm. This infiltrates the neural foramina at T6-7 and to lesser stent T6-8. An enlarged retrocrural lymph node on the right measures 10 x 18 mm. Disc levels: No significant disc disease is present. Right foraminal narrowing is present at C6-7 due to tumor infiltration. There is some narrowing at T7-8 is well. Osseous foraminal narrowing is secondary to remote compression fractures  and facet hypertrophy at T11-12 on the left. MRI LUMBAR SPINE FINDINGS Segmentation: 5 non rib-bearing lumbar type vertebral bodies are present. Alignment: AP alignment is anatomic. Rightward curvature is centered at L3. Vertebrae: Focal tumor enhancement is noted along the superior endplate of L5 on the right measuring 1.7 cm. There is punctate enhancement along the inferior endplate of L3. Remote compression fractures of lumbar spine are most evident at L3-4 and L4-5. The T12 compression fractures noted. Enhancement anteriorly at L1 may reflect metastatic disease versus degenerative change.  Multiple enhancing lesions are present within the right iliac bone. Right greater than left sacral lesions are present as well. Scattered hemangiomas are present. Conus medullaris: Extends to the L1 level and appears normal. Paraspinal and other soft tissues: Extensive right para-aortic and renal hilar adenopathy is present. Right nephrectomy is noted. A soft tissue tumor on the right at the L2 level measures 10 x 6 x 8.4 cm. Multiple other smaller para-aortic nodes are present. Hepatic lesions are suspected. The left kidney demonstrates benign appearing exophytic cysts Disc levels: L1-2: Mild central and bilateral foraminal narrowing is secondary to the a broad-based disc protrusion and facet hypertrophy. L2-3: A broad-based disc protrusion is present. Facet hypertrophy leads to moderate central canal stenosis and moderate foramina foraminal narrowing, left greater than right. L3-4: Moderate subarticular narrowing is worse on the right. Mild foraminal narrowing is present bilaterally. L4-5: A broad-based disc protrusion is present. Facet spurring contributes to mild right foraminal narrowing. Mild subarticular narrowing is evident bilaterally. L5-S1: Facet spurring is present bilaterally. Mild foraminal narrowing is worse on the right. IMPRESSION: 1. Multiple metastatic lesions to the thoracic and lumbar spine as described. 2.  Right paraspinous soft tissue mass at the T6-7 level with extension into the T6 and T7 pedicles aunt T6-7 foramen. 3. Retrocrural and para-aortic adenopathy compatible with metastatic disease. 4. 10 cm aortocaval soft tissue mass compatible with metastatic disease. 5. Remote fractures at T12 L3 and L4 do not appear to be pathologic. 6. The largest bone metastases are at L5, T10, and right greater than left sacroiliac joints. Electronically Signed   By: San Morelle M.D.   On: 05/06/2017 18:28   Mr Lumbar Spine W Wo Contrast  Result Date: 05/06/2017 CLINICAL DATA:  Generalized weakness with progression over the last several weeks. Personal history bladder cancer. Known metastatic disease. EXAM: MRI THORACIC AND LUMBAR SPINE WITHOUT AND WITH CONTRAST TECHNIQUE: Multiplanar and multiecho pulse sequences of the thoracic and lumbar spine were obtained without and with intravenous contrast. CONTRAST:  10mL MULTIHANCE GADOBENATE DIMEGLUMINE 529 MG/ML IV SOLN COMPARISON:  CT of the abdomen and pelvis 01/20/2017. FINDINGS: MRI THORACIC SPINE FINDINGS Alignment: AP alignment is anatomic. Exaggerated kyphosis is present in the lower thoracic spine associated with compression fractures. Rightward curvature of the thoracic spine is centered at T6. Vertebrae: There is diffuse decreased T1 marrow signal. Vertebral body heights are maintained through T11. Enhancing lesion along the superior endplate of F57 measures 13 mm. A 7 mm central enhancing lesion is present at T11. Remote T11 and T12 compression fractures are present without definite enhancement. Tumor is not excluded. Cord:  Normal signal is present throughout the thoracic spinal cord. Paraspinal and other soft tissues: A right paraspinal soft tissue mass is T6-7 measures 5.5 x 5.4 x 2.8 cm. This infiltrates the neural foramina at T6-7 and to lesser stent T6-8. An enlarged retrocrural lymph node on the right measures 10 x 18 mm. Disc levels: No significant disc  disease is present. Right foraminal narrowing is present at C6-7 due to tumor infiltration. There is some narrowing at T7-8 is well. Osseous foraminal narrowing is secondary to remote compression fractures and facet hypertrophy at T11-12 on the left. MRI LUMBAR SPINE FINDINGS Segmentation: 5 non rib-bearing lumbar type vertebral bodies are present. Alignment: AP alignment is anatomic. Rightward curvature is centered at L3. Vertebrae: Focal tumor enhancement is noted along the superior endplate of L5 on the right measuring 1.7 cm. There is punctate enhancement along the inferior endplate of L3. Remote  compression fractures of lumbar spine are most evident at L3-4 and L4-5. The T12 compression fractures noted. Enhancement anteriorly at L1 may reflect metastatic disease versus degenerative change. Multiple enhancing lesions are present within the right iliac bone. Right greater than left sacral lesions are present as well. Scattered hemangiomas are present. Conus medullaris: Extends to the L1 level and appears normal. Paraspinal and other soft tissues: Extensive right para-aortic and renal hilar adenopathy is present. Right nephrectomy is noted. A soft tissue tumor on the right at the L2 level measures 10 x 6 x 8.4 cm. Multiple other smaller para-aortic nodes are present. Hepatic lesions are suspected. The left kidney demonstrates benign appearing exophytic cysts Disc levels: L1-2: Mild central and bilateral foraminal narrowing is secondary to the a broad-based disc protrusion and facet hypertrophy. L2-3: A broad-based disc protrusion is present. Facet hypertrophy leads to moderate central canal stenosis and moderate foramina foraminal narrowing, left greater than right. L3-4: Moderate subarticular narrowing is worse on the right. Mild foraminal narrowing is present bilaterally. L4-5: A broad-based disc protrusion is present. Facet spurring contributes to mild right foraminal narrowing. Mild subarticular narrowing is  evident bilaterally. L5-S1: Facet spurring is present bilaterally. Mild foraminal narrowing is worse on the right. IMPRESSION: 1. Multiple metastatic lesions to the thoracic and lumbar spine as described. 2. Right paraspinous soft tissue mass at the T6-7 level with extension into the T6 and T7 pedicles aunt T6-7 foramen. 3. Retrocrural and para-aortic adenopathy compatible with metastatic disease. 4. 10 cm aortocaval soft tissue mass compatible with metastatic disease. 5. Remote fractures at T12 L3 and L4 do not appear to be pathologic. 6. The largest bone metastases are at L5, T10, and right greater than left sacroiliac joints. Electronically Signed   By: San Morelle M.D.   On: 05/06/2017 18:28   Ct Biopsy  Result Date: 05/08/2017 INDICATION: 82 year old male with a history of urothelial cell carcinoma. Now with multiple lesions on CT concerning for metastases. EXAM: CT BIOPSY MEDICATIONS: None. ANESTHESIA/SEDATION: Moderate (conscious) sedation was employed during this procedure. A total of Versed 2.0 mg and Fentanyl 100 mcg was administered intravenously. Moderate Sedation Time: 10 minutes. The patient's level of consciousness and vital signs were monitored continuously by radiology nursing throughout the procedure under my direct supervision. FLUOROSCOPY TIME:  CT COMPLICATIONS: None PROCEDURE: Informed written consent was obtained from the patient after a thorough discussion of the procedural risks, benefits and alternatives. All questions were addressed. Maximal Sterile Barrier Technique was utilized including caps, mask, sterile gowns, sterile gloves, sterile drape, hand hygiene and skin antiseptic. A timeout was performed prior to the initiation of the procedure. Patient is positioned left decubitus position on the CT gantry table. Scout CT of the thoracic region performed for planning purposes. The patient is then prepped and draped in the usual sterile fashion and the skin and subcutaneous  tissues were generously infiltrated with 1% lidocaine for local anesthesia. Using CT guidance, 17 gauge guide needle was advanced into right paraspinal mass involving the head of the right sixth rib. Multiple 18 gauge core biopsy were performed. Samples placed into formalin. Needle was removed and sterile bandage was placed. Patient tolerated the procedure well and remained hemodynamically stable throughout. No complications were encountered and no significant blood loss. FINDINGS: Scout CT demonstrates soft tissue mass involving the head of the right sixth rib, paraspinal location, seen previously on MRI. Evidence of additional bony metastases involving the sternum/manubrium, as well as the left second rib, fifth rib, with pulmonary nodules  bilaterally concerning for additional metastases. Images during the case demonstrate needle placement within the lesion and the right sixth rib/paraspinal location. IMPRESSION: Status post CT-guided biopsy of right paraspinal lesion at the level of T6. Tissue specimen sent to pathology for complete histopathologic analysis. The scout CT images demonstrate nodule of bilateral lungs concerning for metastases, as well additional site of metastasis involving the left second and fifth ribs. PET-CT may be useful for further evaluation. Signed, Dulcy Fanny. Earleen Newport, DO [facility] Radiology Electronically Signed   By: Corrie Mckusick D.O.   On: 05/08/2017 11:42   Ct Maxillofacial Wo Contrast  Result Date: 05/04/2017 CLINICAL DATA:  Increased weakness with multiple falls, swelling and bruising to the occipital and right zygomatic arch region EXAM: CT HEAD WITHOUT CONTRAST CT MAXILLOFACIAL WITHOUT CONTRAST TECHNIQUE: Multidetector CT imaging of the head and maxillofacial structures were performed using the standard protocol without intravenous contrast. Multiplanar CT image reconstructions of the maxillofacial structures were also generated.  COMPARISON:  None. FINDINGS: CT HEAD FINDINGS Brain: No acute territorial infarction, hemorrhage or intracranial mass is seen. Old encephalomalacia involving the right parietal and temporal lobes consistent with old infarct. Moderate periventricular and subcortical white matter small vessel ischemic changes. Moderate atrophy. Prominent ventricles are felt secondary to atrophy. No midline shift. Vascular: No hyperdense vessels.  Carotid artery calcifications. Skull: No fracture or suspicious bone lesion. Prominent arachnoid granulation in the sub occipital bone on the right. Other: Partially visualize mass surrounding the lateral aspect of the right maxillary sinus and the anterior aspect of the zygoma. CT MAXILLOFACIAL FINDINGS Osseous: Mandibular heads are normally position. No mandibular fracture. Zygomatic arches and pterygoid plates are intact. No acute nasal bone fracture. Mild periosteal change involving the anterior zygoma and lateral wall of the right maxillary sinus. Orbits: No orbital wall fracture. No intra or extraconal soft tissue abnormality Sinuses: No acute fluid levels. Mild mucosal thickening in the maxillary and ethmoid sinuses. No sinus wall fracture. Soft tissues: Small metallic densities in the right and left nasal superficial soft tissues. Soft tissue mass centered at the junction of the right maxillary sinus and anterior zygoma, this measures 3.2 by 1.8 cm. IMPRESSION: 1. No CT evidence for acute intracranial abnormality. 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 Moderate white matter small vessel ischemic changes 2. No acute facial bone fracture. 3. 3.2 cm mass surrounding the junction of the right anterolateral maxillary sinus and the anterior aspect of the zygomatic arch with underlying mild periostitis. Findings could relate to metastatic lesion or primary bone tumor. Further evaluation with MRI is recommended. 4. Superficial metallic densities in the right and left nasal soft  tissues for which clinical correlation is recommended Electronically Signed   By: Donavan Foil M.D.   On: 05/04/2017 21:29    ECHO 05/05/2017 ------------------------------------------------------------------- Study Conclusions  - Procedure narrative: Transthoracic echocardiography. Image   quality was suboptimal. The study was technically difficult, as a   result of poor acoustic windows, poor sound wave transmission,   restricted patient mobility, and body habitus. Intravenous   contrast (Definity) was administered. - Left ventricle: Ver poor image quality even with definity   Difficult to see endocardium Consider cardiac MRI if clinically   indicated to more acurately asses EF. Wall thickness was   increased in a pattern of severe LVH. Systolic function was   normal. The estimated ejection fraction was in the range of 50%   to 55%. Doppler parameters are consistent with abnormal left  ventricular relaxation (grade 1 diastolic dysfunction). - Atrial septum: No defect or patent foramen ovale was identified    Discharge Exam: Vitals:   05/10/17 0525 05/10/17 0924  BP: 123/64 125/66  Pulse: 87 89  Resp: 16   Temp: 98.2 F (36.8 C)    Vitals:   05/09/17 1418 05/09/17 2146 05/10/17 0525 05/10/17 0924  BP:  116/69 123/64 125/66  Pulse:  90 87 89  Resp:  16 16   Temp:  97.7 F (36.5 C) 98.2 F (36.8 C)   TempSrc:  Oral Oral   SpO2: 96% 95% 97%   Weight:   127.7 kg (281 lb 8.4 oz)   Height:        General: Pt is alert, awake, not in acute distress Cardiovascular: RRR, S1/S2 +, no rubs, no gallops Respiratory: Good air entry, decrease breath sounds on the R lower lobe, mild crackles in the LLL  Abdominal: Soft, NT, ND, bowel sounds + Extremities: No LE edema, no cyanosis   The results of significant diagnostics from this hospitalization (including imaging, microbiology, ancillary and laboratory) are listed below for reference.     Microbiology: No results found for  this or any previous visit (from the past 240 hour(s)).   Labs: BNP (last 3 results)  Recent Labs  11/15/16 1735 01/02/17 1043 05/04/17 1859  BNP 639.1* 384.2* 211.9*   Basic Metabolic Panel:  Recent Labs Lab 05/06/17 0533 05/07/17 0507 05/08/17 0507 05/09/17 0524 05/10/17 0543  NA 135 135 132* 132* 133*  K 3.8 3.9 4.0 4.2 4.2  CL 95* 94* 94* 91* 92*  CO2 31 29 29 31 31   GLUCOSE 112* 95 205* 146* 136*  BUN 27* 30* 36* 44* 46*  CREATININE 1.07 1.08 0.99 0.97 1.00  CALCIUM 10.2 10.0 10.2 10.5* 10.5*  MG 1.7 1.6* 1.9 2.0 2.0  PHOS 3.8 3.5 3.2 2.5 2.6   Liver Function Tests:  Recent Labs Lab 05/06/17 0533 05/07/17 0507 05/08/17 0507 05/09/17 0524 05/10/17 0543  AST 38 34 37 45* 53*  ALT 32 29 28 33 37  ALKPHOS 366* 326* 320* 344* 346*  BILITOT 1.0 1.2 0.9 0.9 1.0  PROT 6.1* 5.9* 6.1* 6.3* 5.6*  ALBUMIN 2.6* 2.5* 2.4* 2.6* 2.4*   No results for input(s): LIPASE, AMYLASE in the last 168 hours. No results for input(s): AMMONIA in the last 168 hours. CBC:  Recent Labs Lab 05/06/17 0533 05/07/17 0507 05/08/17 0507 05/09/17 0524 05/10/17 0543  WBC 8.4 9.9 4.3 7.8 6.4  NEUTROABS 6.6 8.3* 3.9 7.1 5.5  HGB 9.0* 8.7* 8.7* 9.1* 9.0*  HCT 28.4* 27.4* 27.0* 28.2* 27.7*  MCV 92.8 91.6 91.8 91.3 90.2  PLT 196 208 180 223 195   Cardiac Enzymes:  Recent Labs Lab 05/04/17 1859  TROPONINI <0.03   BNP: Invalid input(s): POCBNP CBG:  Recent Labs Lab 05/09/17 1215 05/09/17 1704 05/09/17 2132 05/10/17 0734 05/10/17 1222  GLUCAP 162* 114* 170* 112* 156*   D-Dimer No results for input(s): DDIMER in the last 72 hours. Hgb A1c No results for input(s): HGBA1C in the last 72 hours. Lipid Profile No results for input(s): CHOL, HDL, LDLCALC, TRIG, CHOLHDL, LDLDIRECT in the last 72 hours. Thyroid function studies No results for input(s): TSH, T4TOTAL, T3FREE, THYROIDAB in the last 72 hours.  Invalid input(s): FREET3 Anemia work up No results for input(s):  VITAMINB12, FOLATE, FERRITIN, TIBC, IRON, RETICCTPCT in the last 72 hours. Urinalysis    Component Value Date/Time   COLORURINE YELLOW 05/04/2017 2210  APPEARANCEUR CLEAR 05/04/2017 2210   LABSPEC 1.017 05/04/2017 2210   PHURINE 5.0 05/04/2017 2210   GLUCOSEU NEGATIVE 05/04/2017 2210   HGBUR SMALL (A) 05/04/2017 2210   BILIRUBINUR NEGATIVE 05/04/2017 2210   KETONESUR NEGATIVE 05/04/2017 2210   PROTEINUR NEGATIVE 05/04/2017 2210   NITRITE NEGATIVE 05/04/2017 2210   LEUKOCYTESUR LARGE (A) 05/04/2017 2210   Sepsis Labs Invalid input(s): PROCALCITONIN,  WBC,  LACTICIDVEN Microbiology No results found for this or any previous visit (from the past 240 hour(s)).   Time coordinating discharge: 38 minutes  SIGNED:  Chipper Oman, MD  [facility] 05/10/2017, 12:55 PM  Pager please text page via  [email or web address] Password [email or web address]

## 2017-05-10 NOTE — Clinical Social Work Placement (Signed)
Patient received and accepted bed offer at [facility]. [facility] contacted, patient's family aware. Patient's RN can call report to [phone], patient going to room 105.  CLINICAL SOCIAL WORK PLACEMENT  NOTE  Date:  05/10/2017  Patient Details  Name: Victor Wang MRN: [id number] Date of Birth: 08-Oct-1935  Clinical Social Work is seeking post-discharge placement for this patient at the [place] level of care (*CSW will initial, date and re-position this form in  chart as items are completed):  Yes   Patient/family provided with [facility] Work Department's list of facilities offering this level of care within the geographic area requested by the patient (or if unable, by the patient's family).  Yes   Patient/family informed of their freedom to choose among providers that offer the needed level of care, that participate in Medicare, Medicaid or managed care program needed by the patient, have an available bed and are willing to accept the patient.  Yes   Patient/family informed of [facility]'s ownership interest in [facility] and [facility], as well as of the fact that they are under no obligation to receive care at these facilities.  PASRR submitted to EDS on       PASRR number received on       Existing PASRR number confirmed on 05/09/17     FL2 transmitted to all facilities in geographic area requested by pt/family on 05/09/17     FL2 transmitted to all facilities within larger geographic area on       Patient informed that his/her managed care company has contracts with or will negotiate with certain facilities, including the following:        Yes   Patient/family informed of bed offers received.  Patient chooses bed at [facility]     Physician recommends and patient chooses bed at      Patient to be transferred to [facility] on 05/10/17.  Patient to be transferred to  facility by [facility]     Patient family notified on 05/10/17 of transfer.  Name of family member notified:  Cecil Cobbs     PHYSICIAN       Additional Comment:    _______________________________________________ Burnis Medin, LCSW 05/10/2017, 2:06 PM

## 2017-05-10 NOTE — Progress Notes (Signed)
D/c to [facility],Report called to LPN tayla VIA [facility] family at [facility]

## 2017-05-10 NOTE — Progress Notes (Signed)
IP PROGRESS NOTE  Subjective:   No new complaint. He reports adequate pain control. His son and daughter are at the bedside.  Objective: Vital signs in last 24 hours: Blood pressure 125/66, pulse 89, temperature 98.2 F (36.8 C), temperature source Oral, resp. rate 16, height 6' (1.829 m), weight 281 lb 8.4 oz (127.7 kg), SpO2 97 %.  Intake/Output from previous day: 06/12 0701 - 06/13 0700 In: 483 [P.O.:480; I.V.:3] Out: 1350 [Urine:1350]  Physical Exam:  Extremities: No leg edema Neurologic: He is able to bend the right knee. The right foot strength appears intact. Musculoskeletal: Tenderness at the right trochanter   Lab Results:  Recent Labs  05/09/17 0524 05/10/17 0543  WBC 7.8 6.4  HGB 9.1* 9.0*  HCT 28.2* 27.7*  PLT 223 195    BMET  Recent Labs  05/09/17 0524 05/10/17 0543  NA 132* 133*  K 4.2 4.2  CL 91* 92*  CO2 31 31  GLUCOSE 146* 136*  BUN 44* 46*  CREATININE 0.97 1.00  CALCIUM 10.5* 10.5*    Studies/Results: Ct Abdomen Pelvis Wo Contrast  Result Date: 05/09/2017 CLINICAL DATA:  81 year old male with history of urothelial carcinoma and chronic congestive heart failure presenting with increased weakness and multiple falls with progressive lower extremity edema and shortness of breath. EXAM: CT CHEST, ABDOMEN AND PELVIS WITHOUT CONTRAST TECHNIQUE: Multidetector CT imaging of the chest, abdomen and pelvis was performed following the standard protocol without IV contrast. COMPARISON:  CT the abdomen and pelvis 01/20/2017. Chest CT 11/08/2014. FINDINGS: CT CHEST FINDINGS Cardiovascular: Heart size is mildly enlarged. There is no significant pericardial fluid, thickening or pericardial calcification. There is aortic atherosclerosis, as well as atherosclerosis of the great vessels of the mediastinum and the coronary arteries, including calcified atherosclerotic plaque in the left main, left anterior descending, left circumflex and right coronary arteries.  Severe calcifications of the aortic valve. Calcifications of the mitral annulus. Mediastinum/Nodes: No pathologically enlarged mediastinal or hilar lymph nodes. Please note that accurate exclusion of hilar adenopathy is limited on noncontrast CT scans. Esophagus is unremarkable in appearance. No axillary lymphadenopathy. Lungs/Pleura: Several pulmonary nodules are noted throughout the lungs bilaterally, the largest of which is in the posterior aspect of the left lower lobe (axial image 90 of series 4), concerning for widespread metastatic disease to the lungs. Extensive thickening of the peribronchovascular interstitium in the left lower lobe may reflect developing lymphangitic spread of tumor. Moderate right-sided pleural effusion lying dependently. Extensive passive atelectasis in the right lower lobe. Diffuse bronchial wall thickening with mild to moderate centrilobular and mild paraseptal emphysema. Musculoskeletal: Numerous lytic osseous lesions are noted, compatible with widespread metastatic disease to the bones. The best examples of these include a large lytic lesion in the manubrium, lytic lesion in the posterior aspect of the right seventh rib, lytic lesion in the lateral aspect of the left second rib, and a large expansile lytic lesion in the posterolateral aspect of the left fifth rib. In addition, there multiple old healed bilateral rib fractures. Chronic compression fracture of T12 with 90% loss of anterior vertebral body height is unchanged. CT ABDOMEN PELVIS FINDINGS Hepatobiliary: Innumerable low-attenuation lesions are noted throughout the hepatic parenchyma, compatible with widespread metastatic disease to the liver. The largest lesion or confluence of lesions is in the right lobe of the liver measuring 12 x 7.7 cm (axial image 52 of series 2). The overall size and number of these hepatic lesions has significantly increased compared to prior study from February 2018. The  underlying liver appears  cirrhotic. Numerous calcified gallstones are noted within the gallbladder. No findings to suggest an acute cholecystitis at this time. Pancreas: No definite pancreatic mass or peripancreatic inflammatory changes are noted on today's noncontrast CT examination. Spleen: Spleen is enlarged measuring 18.1 x 7.2 x 18.2 cm (estimated splenic volume of 1,186 mL). Adrenals/Urinary Tract: Status post right nephroureterectomy. Large mass in the nephrectomy bed may represent residual tumor, or may simply represent enlarging retroperitoneal lymphadenopathy. This has significantly increased in size compared to the prior study, currently measuring 6.8 x 9.9 cm (axial image 65 of series 2) and is intimately associated with both the inferior vena cava which is displaced anteriorly, and the abdominal aorta. Multiple small left renal lesions range from low to intermediate to high attenuation, incompletely characterized on today's noncontrast CT examination, but likely to represent cysts of varying degrees of complexity. No left hydroureteronephrosis. Unenhanced appearance of the urinary bladder is unremarkable. Stomach/Bowel: Unenhanced appearance of the stomach is normal. There is no pathologic dilatation of small bowel or colon. Numerous colonic diverticulae are noted, without surrounding inflammatory changes to suggest an acute diverticulitis at this time. Vascular/Lymphatic: Aortic atherosclerosis with ectasia and fusiform aneurysmal dilatation of the infrarenal abdominal aorta which measures up to 3.1 cm in diameter. There is also aneurysmal dilatation of the right common iliac artery which measures up to 2.2 cm in diameter. Portal vein is dilated (19 mm in diameter). Numerous large splenorenal collateral vessels, including a large venous varix adjacent to the splenic hilum measuring 3.5 cm in diameter. Extensive retroperitoneal lymphadenopathy has significantly increased compared to the prior examination. In addition to the  large mass in the nephrectomy bed (which may be nodal in origin) several other enlarged retroperitoneal lymph nodes are noted, measuring up to 2.4 cm in short axis in the aortocaval nodal station (axial image 80 of series 2). Reproductive: Prostate gland and seminal vesicles are diminutive. Other: No significant volume of ascites.  No pneumoperitoneum. Musculoskeletal: Lytic lesion in the greater trochanter of the right femur with pathologic fracture (axial image 117 of series 2). Subtle lytic lesion in the left iliac crest (axial image 92 of series 2). Chronic compression fracture of L3 with 50% loss of anterior vertebral body height is unchanged. IMPRESSION: 1. Widespread metastatic disease in the chest, abdomen and pelvis, as detailed above, including multiple pulmonary nodules, right pleural effusion (which is likely malignant), multiple large hepatic metastases, large mass in the nephrectomy bed which may represent residual disease and/or worsening lymphadenopathy, increasing retroperitoneal lymphadenopathy, and widespread metastatic disease to the bones. 2. Stigmata of cirrhosis with portal hypertension, including splenomegaly and large splenorenal collateral vessels, as above. 3. Aortic atherosclerosis, in addition to left main and 3 vessel coronary artery disease. 4. 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 5. Cholelithiasis without evidence of acute cholecystitis at this time. 6. Diffuse bronchial wall thickening with mild to moderate centrilobular and mild paraseptal emphysema; imaging findings suggestive of underlying COPD. 7. Additional incidental findings, as above. Electronically Signed   By: Vinnie Langton M.D.   On: 05/09/2017 19:56   Ct Chest Wo Contrast  Result Date: 05/09/2017 CLINICAL DATA:  81 year old male with history of urothelial carcinoma and chronic congestive heart failure presenting with increased weakness and multiple falls  with progressive lower extremity edema and shortness of breath. EXAM: CT CHEST, ABDOMEN AND PELVIS WITHOUT CONTRAST TECHNIQUE: Multidetector CT imaging of the chest, abdomen and pelvis was performed following the standard protocol  without IV contrast. COMPARISON:  CT the abdomen and pelvis 01/20/2017. Chest CT 11/08/2014. FINDINGS: CT CHEST FINDINGS Cardiovascular: Heart size is mildly enlarged. There is no significant pericardial fluid, thickening or pericardial calcification. There is aortic atherosclerosis, as well as atherosclerosis of the great vessels of the mediastinum and the coronary arteries, including calcified atherosclerotic plaque in the left main, left anterior descending, left circumflex and right coronary arteries. Severe calcifications of the aortic valve. Calcifications of the mitral annulus. Mediastinum/Nodes: No pathologically enlarged mediastinal or hilar lymph nodes. Please note that accurate exclusion of hilar adenopathy is limited on noncontrast CT scans. Esophagus is unremarkable in appearance. No axillary lymphadenopathy. Lungs/Pleura: Several pulmonary nodules are noted throughout the lungs bilaterally, the largest of which is in the posterior aspect of the left lower lobe (axial image 90 of series 4), concerning for widespread metastatic disease to the lungs. Extensive thickening of the peribronchovascular interstitium in the left lower lobe may reflect developing lymphangitic spread of tumor. Moderate right-sided pleural effusion lying dependently. Extensive passive atelectasis in the right lower lobe. Diffuse bronchial wall thickening with mild to moderate centrilobular and mild paraseptal emphysema. Musculoskeletal: Numerous lytic osseous lesions are noted, compatible with widespread metastatic disease to the bones. The best examples of these include a large lytic lesion in the manubrium, lytic lesion in the posterior aspect of the right seventh rib, lytic lesion in the lateral aspect  of the left second rib, and a large expansile lytic lesion in the posterolateral aspect of the left fifth rib. In addition, there multiple old healed bilateral rib fractures. Chronic compression fracture of T12 with 90% loss of anterior vertebral body height is unchanged. CT ABDOMEN PELVIS FINDINGS Hepatobiliary: Innumerable low-attenuation lesions are noted throughout the hepatic parenchyma, compatible with widespread metastatic disease to the liver. The largest lesion or confluence of lesions is in the right lobe of the liver measuring 12 x 7.7 cm (axial image 52 of series 2). The overall size and number of these hepatic lesions has significantly increased compared to prior study from February 2018. The underlying liver appears cirrhotic. Numerous calcified gallstones are noted within the gallbladder. No findings to suggest an acute cholecystitis at this time. Pancreas: No definite pancreatic mass or peripancreatic inflammatory changes are noted on today's noncontrast CT examination. Spleen: Spleen is enlarged measuring 18.1 x 7.2 x 18.2 cm (estimated splenic volume of 1,186 mL). Adrenals/Urinary Tract: Status post right nephroureterectomy. Large mass in the nephrectomy bed may represent residual tumor, or may simply represent enlarging retroperitoneal lymphadenopathy. This has significantly increased in size compared to the prior study, currently measuring 6.8 x 9.9 cm (axial image 65 of series 2) and is intimately associated with both the inferior vena cava which is displaced anteriorly, and the abdominal aorta. Multiple small left renal lesions range from low to intermediate to high attenuation, incompletely characterized on today's noncontrast CT examination, but likely to represent cysts of varying degrees of complexity. No left hydroureteronephrosis. Unenhanced appearance of the urinary bladder is unremarkable. Stomach/Bowel: Unenhanced appearance of the stomach is normal. There is no pathologic dilatation  of small bowel or colon. Numerous colonic diverticulae are noted, without surrounding inflammatory changes to suggest an acute diverticulitis at this time. Vascular/Lymphatic: Aortic atherosclerosis with ectasia and fusiform aneurysmal dilatation of the infrarenal abdominal aorta which measures up to 3.1 cm in diameter. There is also aneurysmal dilatation of the right common iliac artery which measures up to 2.2 cm in diameter. Portal vein is dilated (19 mm in diameter). Numerous large  splenorenal collateral vessels, including a large venous varix adjacent to the splenic hilum measuring 3.5 cm in diameter. Extensive retroperitoneal lymphadenopathy has significantly increased compared to the prior examination. In addition to the large mass in the nephrectomy bed (which may be nodal in origin) several other enlarged retroperitoneal lymph nodes are noted, measuring up to 2.4 cm in short axis in the aortocaval nodal station (axial image 80 of series 2). Reproductive: Prostate gland and seminal vesicles are diminutive. Other: No significant volume of ascites.  No pneumoperitoneum. Musculoskeletal: Lytic lesion in the greater trochanter of the right femur with pathologic fracture (axial image 117 of series 2). Subtle lytic lesion in the left iliac crest (axial image 92 of series 2). Chronic compression fracture of L3 with 50% loss of anterior vertebral body height is unchanged. IMPRESSION: 1. Widespread metastatic disease in the chest, abdomen and pelvis, as detailed above, including multiple pulmonary nodules, right pleural effusion (which is likely malignant), multiple large hepatic metastases, large mass in the nephrectomy bed which may represent residual disease and/or worsening lymphadenopathy, increasing retroperitoneal lymphadenopathy, and widespread metastatic disease to the bones. 2. Stigmata of cirrhosis with portal hypertension, including splenomegaly and large splenorenal collateral vessels, as above. 3.  Aortic atherosclerosis, in addition to left main and 3 vessel coronary artery disease. 4. 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 5. Cholelithiasis without evidence of acute cholecystitis at this time. 6. Diffuse bronchial wall thickening with mild to moderate centrilobular and mild paraseptal emphysema; imaging findings suggestive of underlying COPD. 7. Additional incidental findings, as above. Electronically Signed   By: Vinnie Langton M.D.   On: 05/09/2017 19:56   Ct Biopsy  Result Date: 05/08/2017 INDICATION: 82 year old male with a history of urothelial cell carcinoma. Now with multiple lesions on CT concerning for metastases. EXAM: CT BIOPSY MEDICATIONS: None. ANESTHESIA/SEDATION: Moderate (conscious) sedation was employed during this procedure. A total of Versed 2.0 mg and Fentanyl 100 mcg was administered intravenously. Moderate Sedation Time: 10 minutes. The patient's level of consciousness and vital signs were monitored continuously by radiology nursing throughout the procedure under my direct supervision. FLUOROSCOPY TIME:  CT COMPLICATIONS: None PROCEDURE: Informed written consent was obtained from the patient after a thorough discussion of the procedural risks, benefits and alternatives. All questions were addressed. Maximal Sterile Barrier Technique was utilized including caps, mask, sterile gowns, sterile gloves, sterile drape, hand hygiene and skin antiseptic. A timeout was performed prior to the initiation of the procedure. Patient is positioned left decubitus position on the CT gantry table. Scout CT of the thoracic region performed for planning purposes. The patient is then prepped and draped in the usual sterile fashion and the skin and subcutaneous tissues were generously infiltrated with 1% lidocaine for local anesthesia. Using CT guidance, 17 gauge guide needle was advanced into right paraspinal mass involving the head of the right  sixth rib. Multiple 18 gauge core biopsy were performed. Samples placed into formalin. Needle was removed and sterile bandage was placed. Patient tolerated the procedure well and remained hemodynamically stable throughout. No complications were encountered and no significant blood loss. FINDINGS: Scout CT demonstrates soft tissue mass involving the head of the right sixth rib, paraspinal location, seen previously on MRI. Evidence of additional bony metastases involving the sternum/manubrium, as well as the left second rib, fifth rib, with pulmonary nodules bilaterally concerning for additional metastases. Images during the case demonstrate needle placement within the lesion and the right sixth rib/paraspinal location. IMPRESSION: Status  post CT-guided biopsy of right paraspinal lesion at the level of T6. Tissue specimen sent to pathology for complete histopathologic analysis. The scout CT images demonstrate nodule of bilateral lungs concerning for metastases, as well additional site of metastasis involving the left second and fifth ribs. PET-CT may be useful for further evaluation. Signed, Dulcy Fanny. Earleen Newport, DO [facility] Electronically Signed   By: Corrie Mckusick D.O.   On: 05/08/2017 11:42    Medications: I have reviewed the patient's current medications.  Assessment/Plan:  1. Urothelial carcinoma, initially diagnosed in 2015 with noninvasive urothelial carcinoma the bladder, status post resection followed by BCG therapy  Multiple recurrences of noninvasive bladder carcinoma, last underwent resection in 2016  Right renal pelvis tumor December 2017, biopsy January 2018 confirmed high-grade urothelial carcinoma  Right nephroureterectomy March 2018 confirmed a high-grade invasive urothelial carcinoma of the right kidney, T4, with lymphovascular invasion  Gross hematuria May 2018-status post removal of retained clot and resection of urothelial  carcinoma at the prostate  CT head 05/04/2017 confirmed a mass at the junction of the right maxillary sinus and inferior aspect of the zygomatic arch  MRI thoracic/lumbar spine 05/06/2017 confirmed multiple metastatic lesions, a right paraspinous soft tissue mass at T6-T7, retrocrural and periaortic adenopathy, 10 cm aortocaval mass  CT biopsy of right T6 paraspinal mass 05/08/2017, lung/rib/sternal metastases seen, pathology confirmed metastatic carcinoma consistent with urothelial carcinoma  CTs chest, abdomen, and pelvis 05/09/2017-metastatic disease involving lung nodules, multiple bone lesions, liver metastases, and abdominal/right peritoneal lymph nodes. Fracture of the right greater trochanter  2. Back/chest pain-likely secondary to bone metastases  3. Right leg weakness-potentially secondary to nerve root compression for metastatic urothelial carcinoma, though no cord compression is noted on the MRI 05/06/2017  4.  Congestive heart failure  5.  Anemia  6.  Cirrhosis  7.  Hypercalcemia-Zometa given 05/09/2017   Mr. Kennan appears unchanged. The paraspinous mass biopsy confirmed metastatic urothelial carcinoma. He has extensive metastatic disease involving the bones, liver, lungs, and lymph nodes. He has a poor performance status. I discussed the prognosis and treatment options with Mr. Quiroa and his family. We discussed comfort/hospice care versus a trial of systemic therapy. He does not appear to be a candidate for chemotherapy. He indicates that he would like to try treatment with immunotherapy if Dr. Hinton Rao feels he is a candidate.  He plans to be transferred to a skilled nursing facility closer to home. He may be discharged today. I will refer him to Dr. Hinton Rao at the [facility]. I discussed the case with Dr. Hinton Rao. I am available to see him here as needed.  He has a pathologic fracture at the right greater trochanter. I will review the images with  radiology, but I do not think this is in an area that will placement increased risk for a weightbearing fracture.   Recommendations: 1. Discharged to a skilled nursing facility in [place] 2. Referral made to Dr. Hinton Rao at the [facility] 3. Continue Hydrocodone as needed for pain   LOS: 6 days   Donneta Romberg, MD   05/10/2017, 11:08 AM

## 2017-05-11 ENCOUNTER — Telehealth: Payer: Self-pay | Admitting: *Deleted

## 2017-05-11 DIAGNOSIS — C7951 Secondary malignant neoplasm of bone: Secondary | ICD-10-CM | POA: Diagnosis not present

## 2017-05-11 DIAGNOSIS — C229 Malignant neoplasm of liver, not specified as primary or secondary: Secondary | ICD-10-CM | POA: Diagnosis not present

## 2017-05-11 DIAGNOSIS — R52 Pain, unspecified: Secondary | ICD-10-CM | POA: Diagnosis not present

## 2017-05-11 DIAGNOSIS — R54 Age-related physical debility: Secondary | ICD-10-CM | POA: Diagnosis not present

## 2017-05-11 DIAGNOSIS — C3490 Malignant neoplasm of unspecified part of unspecified bronchus or lung: Secondary | ICD-10-CM | POA: Diagnosis not present

## 2017-05-11 DIAGNOSIS — I509 Heart failure, unspecified: Secondary | ICD-10-CM | POA: Diagnosis not present

## 2017-05-11 DIAGNOSIS — C679 Malignant neoplasm of bladder, unspecified: Secondary | ICD-10-CM | POA: Diagnosis not present

## 2017-05-11 NOTE — Telephone Encounter (Signed)
Family member called wanting to know the name of the immunotherapy that was discussed during his previous visit. Patient family member would also like to know if he is a candidate for "PERP"?

## 2017-05-12 NOTE — Telephone Encounter (Signed)
Left message at pt's home #, he is scheduled to see Dr. Hinton Rao on 6/18. She will answer any questions he has. Feel free to call office back if he has questions before then. Per Dr. Benay Spice: he mentioned pembrolizumab and atezolizumab.  Not sure what they mean by PERP.

## 2017-05-18 DIAGNOSIS — C651 Malignant neoplasm of right renal pelvis: Secondary | ICD-10-CM | POA: Diagnosis not present

## 2017-05-18 DIAGNOSIS — R52 Pain, unspecified: Secondary | ICD-10-CM | POA: Diagnosis not present

## 2017-05-18 DIAGNOSIS — C679 Malignant neoplasm of bladder, unspecified: Secondary | ICD-10-CM | POA: Diagnosis not present

## 2017-05-18 DIAGNOSIS — E785 Hyperlipidemia, unspecified: Secondary | ICD-10-CM | POA: Diagnosis not present

## 2017-05-18 DIAGNOSIS — K219 Gastro-esophageal reflux disease without esophagitis: Secondary | ICD-10-CM | POA: Diagnosis not present

## 2017-05-18 DIAGNOSIS — C7951 Secondary malignant neoplasm of bone: Secondary | ICD-10-CM | POA: Diagnosis not present

## 2017-05-18 DIAGNOSIS — I509 Heart failure, unspecified: Secondary | ICD-10-CM | POA: Diagnosis not present

## 2017-05-18 DIAGNOSIS — J449 Chronic obstructive pulmonary disease, unspecified: Secondary | ICD-10-CM | POA: Diagnosis not present

## 2017-05-26 ENCOUNTER — Other Ambulatory Visit: Payer: Self-pay | Admitting: *Deleted

## 2017-05-26 NOTE — Patient Outreach (Signed)
[facility]) Care Management  05/26/2017  Victor Wang 1935-10-09 [id number]   Met with [name], SW at facility.  She reports patient is remaining LTC at facility. She reports his cancer has progressed and metastasized and they have only given him a few weeks to live. She anticipates patient to transition to hospice, family wants patient to remain in facility.  Plan to sign off as not [facility] community care management needs assessed.  Royetta Crochet. Laymond Purser, RN, BSN, [place] [phone]) Business Cell  [phone]) Toll Free Office

## 2017-05-30 ENCOUNTER — Telehealth: Payer: Self-pay

## 2017-05-30 NOTE — Telephone Encounter (Signed)
Called pt's wife as requested with prognosis information per MD Benay Spice. Pt appreciative of call back.

## 2017-06-02 DIAGNOSIS — C7951 Secondary malignant neoplasm of bone: Secondary | ICD-10-CM | POA: Diagnosis not present

## 2017-06-02 DIAGNOSIS — Z8709 Personal history of other diseases of the respiratory system: Secondary | ICD-10-CM | POA: Diagnosis not present

## 2017-06-02 DIAGNOSIS — Z8719 Personal history of other diseases of the digestive system: Secondary | ICD-10-CM | POA: Diagnosis not present

## 2017-06-02 DIAGNOSIS — Z8546 Personal history of malignant neoplasm of prostate: Secondary | ICD-10-CM | POA: Diagnosis not present

## 2017-06-02 DIAGNOSIS — C679 Malignant neoplasm of bladder, unspecified: Secondary | ICD-10-CM | POA: Diagnosis not present

## 2017-06-02 DIAGNOSIS — C7900 Secondary malignant neoplasm of unspecified kidney and renal pelvis: Secondary | ICD-10-CM | POA: Diagnosis not present

## 2017-06-02 DIAGNOSIS — Z8679 Personal history of other diseases of the circulatory system: Secondary | ICD-10-CM | POA: Diagnosis not present

## 2017-06-05 DIAGNOSIS — C7900 Secondary malignant neoplasm of unspecified kidney and renal pelvis: Secondary | ICD-10-CM | POA: Diagnosis not present

## 2017-06-05 DIAGNOSIS — C679 Malignant neoplasm of bladder, unspecified: Secondary | ICD-10-CM | POA: Diagnosis not present

## 2017-06-05 DIAGNOSIS — Z8679 Personal history of other diseases of the circulatory system: Secondary | ICD-10-CM | POA: Diagnosis not present

## 2017-06-05 DIAGNOSIS — C7951 Secondary malignant neoplasm of bone: Secondary | ICD-10-CM | POA: Diagnosis not present

## 2017-06-05 DIAGNOSIS — Z8709 Personal history of other diseases of the respiratory system: Secondary | ICD-10-CM | POA: Diagnosis not present

## 2017-06-05 DIAGNOSIS — Z8546 Personal history of malignant neoplasm of prostate: Secondary | ICD-10-CM | POA: Diagnosis not present

## 2017-06-28 DEATH — deceased

## 2018-01-28 IMAGING — CT CT ABD-PELV W/O CM
2 of 4 series · 16 of 46 positions shown, 18 images · non-contrast
Comparison: 11/17/2016, ultrasound 01/01/2017, MRI 12/16/2014

CLINICAL DATA: Right flank pain, evaluate for hematoma, status post
cystoscopy with double-J stent

EXAM:
CT ABDOMEN AND PELVIS WITHOUT CONTRAST
TECHNIQUE: Multidetector CT imaging of the abdomen and pelvis was performed
following the standard protocol without IV contrast.

[Series 2: axial st · axial · 0.96mm/px · z∈[+1144,+1549]mm · 13 of 91 slices shown, 15 images]
[im 5/91  soft-tissue]
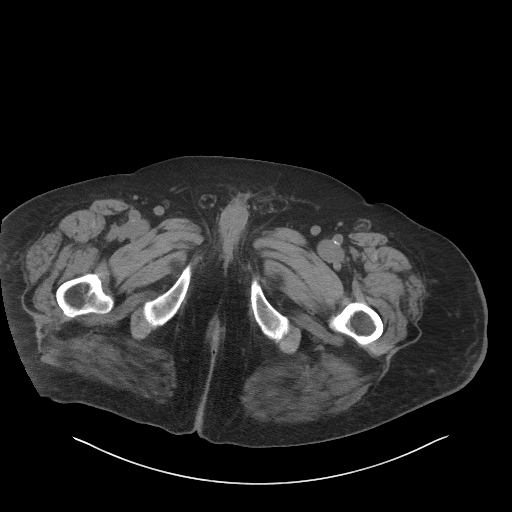
[im 5/91  bone]
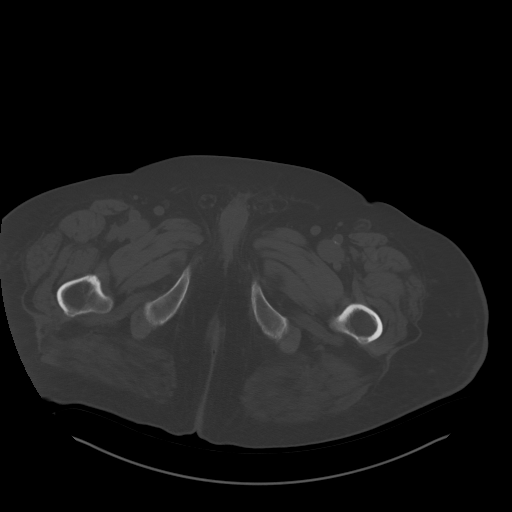
[im 13/91  soft-tissue]
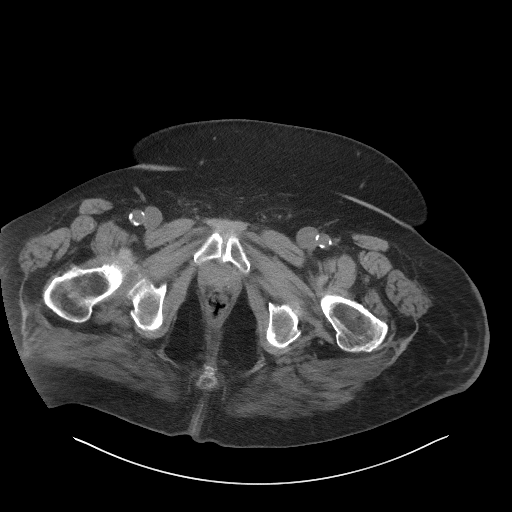
[im 21/91  soft-tissue]
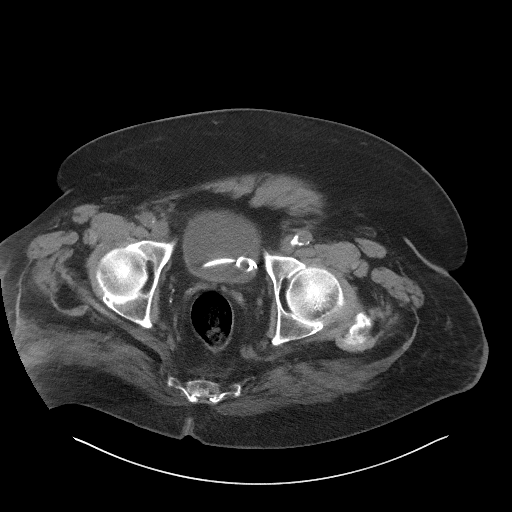
[im 25/91  soft-tissue]
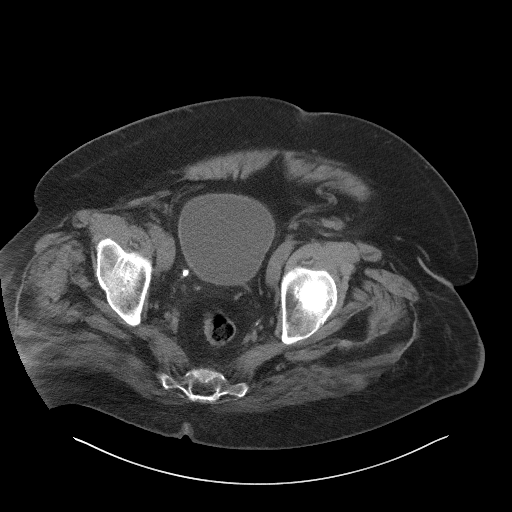
[im 33/91  soft-tissue]
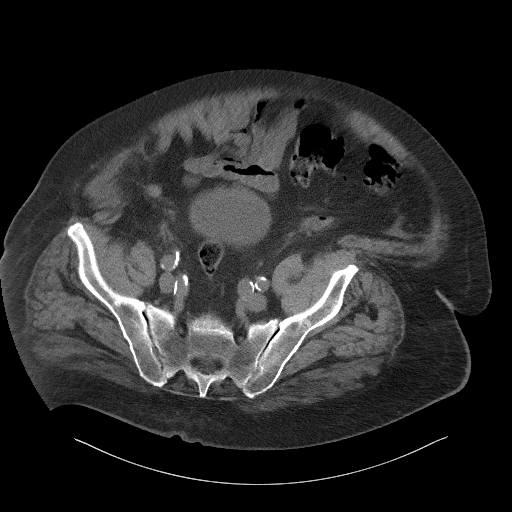
[im 37/91  soft-tissue]
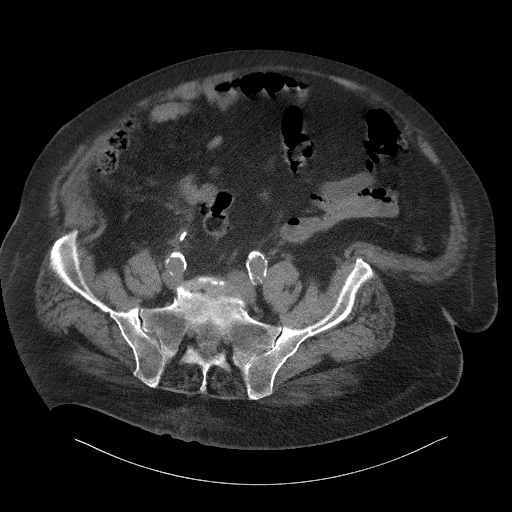
[im 46/91  soft-tissue]
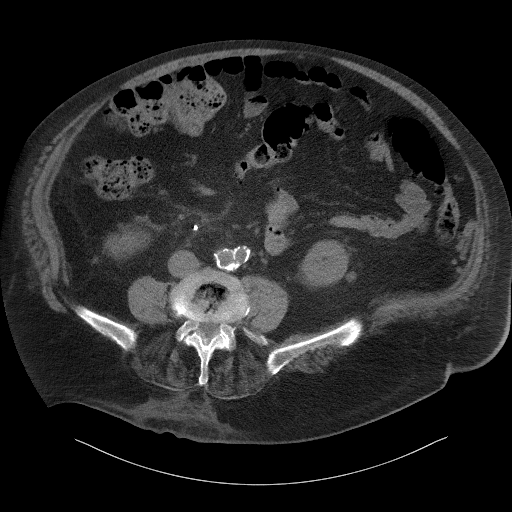
[im 54/91  soft-tissue]
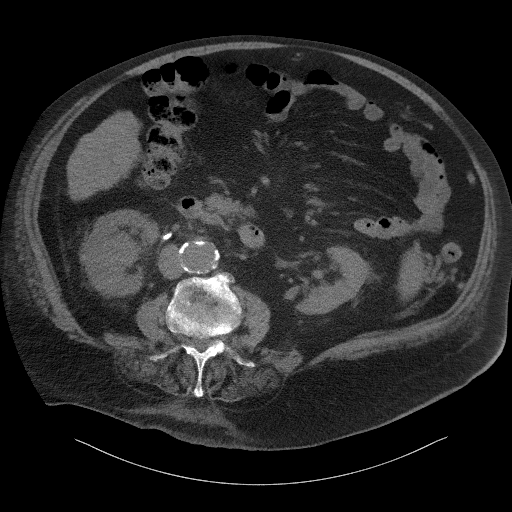
[im 58/91  soft-tissue]
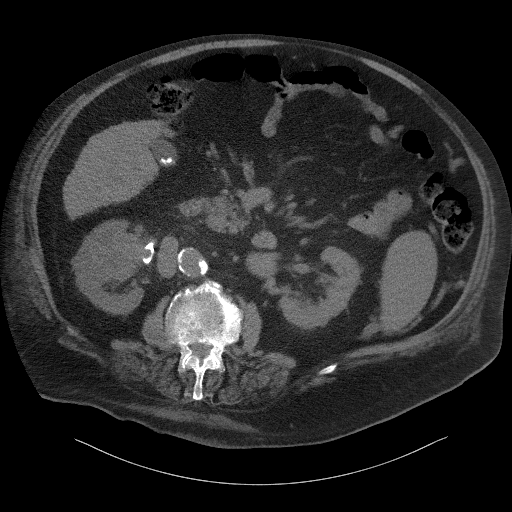
[im 58/91  bone]
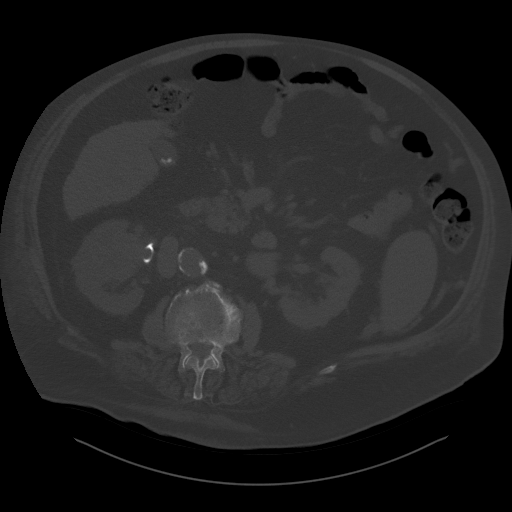
[im 66/91  soft-tissue]
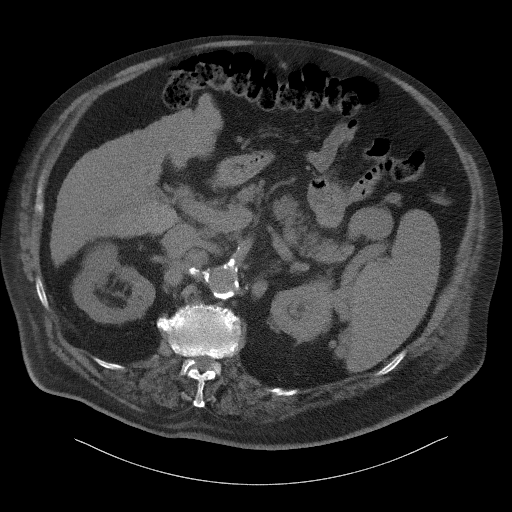
[im 70/91  soft-tissue]
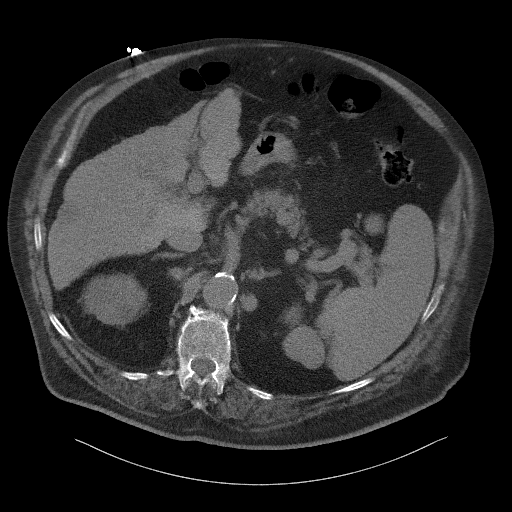
[im 78/91  soft-tissue]
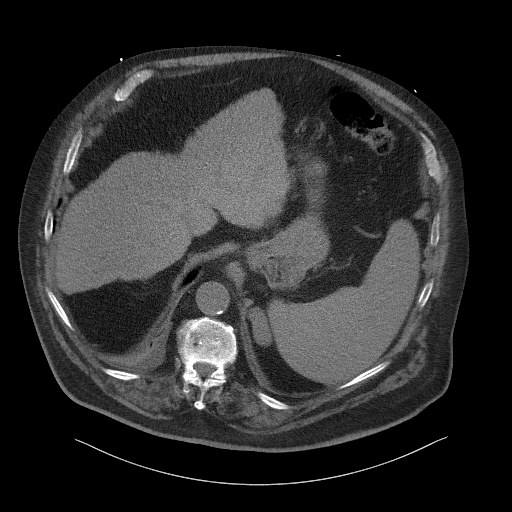
[im 86/91  soft-tissue]
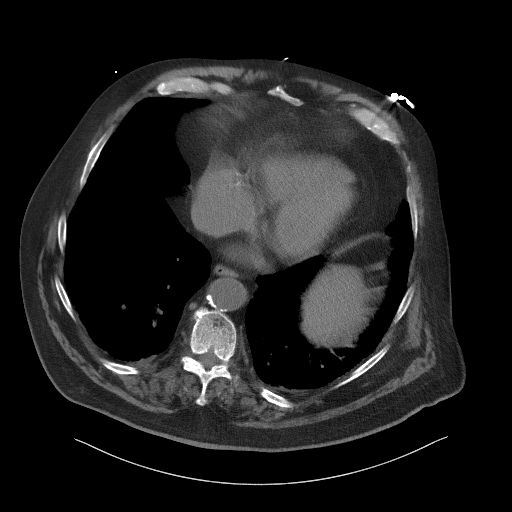

[Series 4: coronal st · coronal · 0.88mm/px · 3 of 127 slices shown]
[im 43/127  soft-tissue]
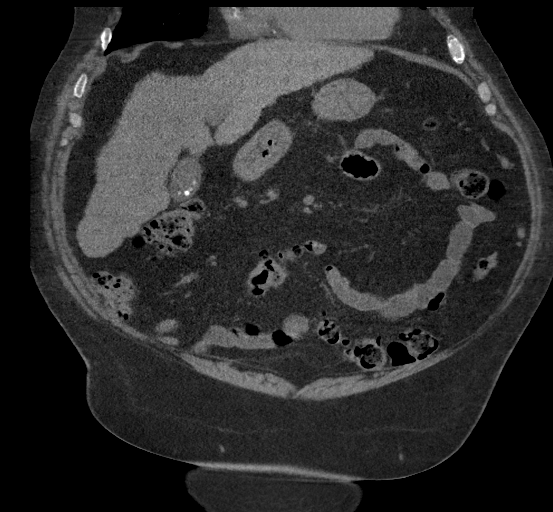
[im 57/127  soft-tissue]
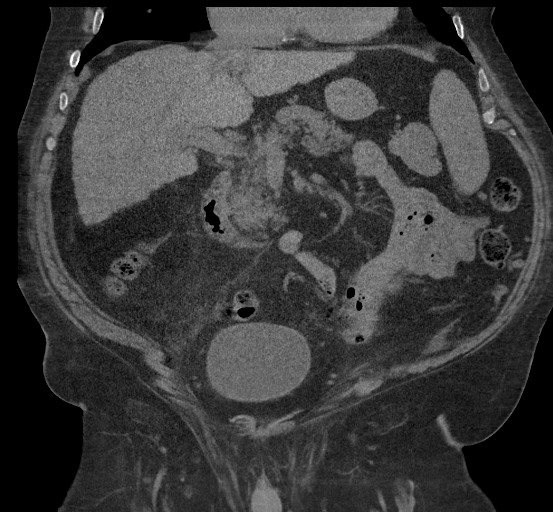
[im 71/127  soft-tissue]
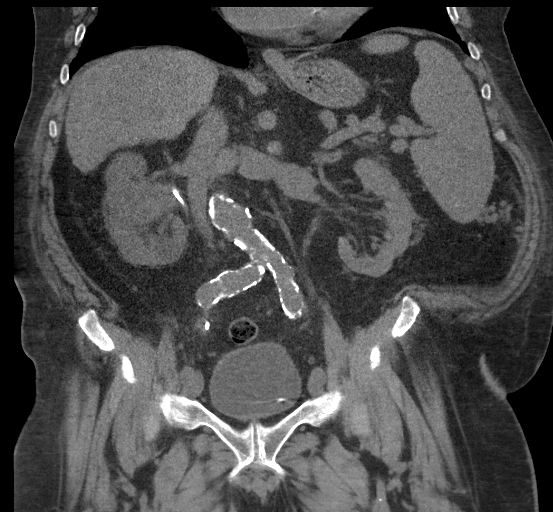

[16 of 46 positions shown; findings below may reference images not displayed]

FINDINGS: Lower chest: Trace right-sided pleural effusion. Patchy atelectasis
within the right greater than left lung bases. Coronary artery
calcification.

Hepatobiliary: Nodular liver contour consistent with cirrhosis.
Scattered subcentimeter hypodense lesions too small to characterize.
Vague peripheral right hepatic lobe hypodensity, likely corresponds
to MRI demonstrated probable hemangioma. No biliary dilatation.
Multiple calcified gallstones

Pancreas: No surrounding inflammation. Vague hypodense lesion mid
pancreatic body may correspond to MRI suggested abnormality.

Spleen: Enlarged at 18 cm.

Adrenals/Urinary Tract: Adrenal glands within normal limits.
Hypodense mass within the central right kidney measuring 4.4 cm,
grossly unchanged. Placement of ureteral stent on the right with
proximal portion overlying the renal pelvis and distal portion
visualized within the left aspect of the bladder. Bladder
demonstrates a small focus of gas anteriorly. No wall thickening. No
significant perinephric fluid collection. No hydronephrosis on the
left. 11 mm exophytic density posterior cortex left kidney stable
compared to MRI.

Stomach/Bowel: Stomach nonenlarged. No dilated small or large bowel.
No colon wall thickening.

Vascular/Lymphatic: Tortuous vascular structures adjacent to the
spleen consistent with varices. Atherosclerotic vascular
calcification. Mild aneurysmal dilatation of the right common iliac
artery, unchanged. No grossly enlarged lymph nodes.

Reproductive: No masses or abnormalities.

Other: No free air or free fluid. Mild hazy edema within the
retroperitoneum. No evidence for retroperitoneal hematoma.

Musculoskeletal: Stable compression deformities of T12 and L3.
Multilevel vacuum disc.
IMPRESSION: 1. No perinephric fluid collections or evidence for retroperitoneal
hematoma
2. Stable 4.4 cm mass within the central right kidney. Placement of
a right-sided ureteral stent as described above.
3. Cirrhosis. Portal hypertension with splenomegaly and left upper
quadrant varices
4. Gallstones
5. Stable compression deformities of T12 and L3
